# Patient Record
Sex: Female | Born: 1965
Health system: Southern US, Community
[De-identification: ages and names within clinical notes are randomized; demographics above are authoritative.]

## PROBLEM LIST (undated history)

## (undated) DIAGNOSIS — R011 Cardiac murmur, unspecified: Secondary | ICD-10-CM

## (undated) DIAGNOSIS — K859 Acute pancreatitis without necrosis or infection, unspecified: Secondary | ICD-10-CM

## (undated) DIAGNOSIS — E785 Hyperlipidemia, unspecified: Secondary | ICD-10-CM

## (undated) DIAGNOSIS — J189 Pneumonia, unspecified organism: Secondary | ICD-10-CM

## (undated) DIAGNOSIS — Z8489 Family history of other specified conditions: Secondary | ICD-10-CM

## (undated) DIAGNOSIS — D649 Anemia, unspecified: Secondary | ICD-10-CM

## (undated) DIAGNOSIS — I10 Essential (primary) hypertension: Secondary | ICD-10-CM

## (undated) DIAGNOSIS — J45909 Unspecified asthma, uncomplicated: Secondary | ICD-10-CM

## (undated) HISTORY — PX: GANGLION CYST EXCISION: SHX1691

## (undated) HISTORY — PX: ABDOMINAL HYSTERECTOMY: SHX81

## (undated) HISTORY — PX: CHOLECYSTECTOMY: SHX55

## (undated) HISTORY — PX: LUMBAR FUSION: SHX111

## (undated) HISTORY — DX: Hyperlipidemia, unspecified: E78.5

## (undated) HISTORY — PX: LAPAROSCOPY: SHX197

---

## 1997-05-26 ENCOUNTER — Other Ambulatory Visit: Admission: RE | Admit: 1997-05-26 | Discharge: 1997-05-26 | Payer: Self-pay | Admitting: Family Medicine

## 2001-09-07 ENCOUNTER — Encounter: Admission: RE | Admit: 2001-09-07 | Discharge: 2001-12-06 | Payer: Self-pay | Admitting: Family Medicine

## 2002-02-04 ENCOUNTER — Other Ambulatory Visit: Admission: RE | Admit: 2002-02-04 | Discharge: 2002-02-04 | Payer: Self-pay | Admitting: Obstetrics and Gynecology

## 2004-04-12 ENCOUNTER — Other Ambulatory Visit: Admission: RE | Admit: 2004-04-12 | Discharge: 2004-04-12 | Payer: Self-pay | Admitting: Family Medicine

## 2005-04-21 ENCOUNTER — Other Ambulatory Visit: Admission: RE | Admit: 2005-04-21 | Discharge: 2005-04-21 | Payer: Self-pay | Admitting: Obstetrics and Gynecology

## 2006-07-14 ENCOUNTER — Other Ambulatory Visit: Admission: RE | Admit: 2006-07-14 | Discharge: 2006-07-14 | Payer: Self-pay | Admitting: Obstetrics and Gynecology

## 2008-11-08 ENCOUNTER — Other Ambulatory Visit: Admission: RE | Admit: 2008-11-08 | Discharge: 2008-11-08 | Payer: Self-pay | Admitting: Obstetrics and Gynecology

## 2011-06-11 ENCOUNTER — Other Ambulatory Visit (HOSPITAL_COMMUNITY)
Admission: RE | Admit: 2011-06-11 | Discharge: 2011-06-11 | Disposition: A | Discharge status: Home / Self Care | Payer: 59 | Source: Ambulatory Visit | Attending: Obstetrics and Gynecology | Admitting: Obstetrics and Gynecology

## 2011-06-11 ENCOUNTER — Other Ambulatory Visit: Payer: Self-pay | Admitting: Obstetrics and Gynecology

## 2011-06-11 DIAGNOSIS — Z1159 Encounter for screening for other viral diseases: Secondary | ICD-10-CM | POA: Insufficient documentation

## 2011-06-11 DIAGNOSIS — Z01419 Encounter for gynecological examination (general) (routine) without abnormal findings: Secondary | ICD-10-CM | POA: Insufficient documentation

## 2011-07-04 ENCOUNTER — Other Ambulatory Visit: Payer: Self-pay | Admitting: Obstetrics and Gynecology

## 2011-07-04 DIAGNOSIS — Z1231 Encounter for screening mammogram for malignant neoplasm of breast: Secondary | ICD-10-CM

## 2011-07-14 ENCOUNTER — Ambulatory Visit: Payer: 59

## 2011-07-30 ENCOUNTER — Encounter (HOSPITAL_COMMUNITY): Payer: Self-pay | Admitting: Pharmacist

## 2011-08-06 ENCOUNTER — Encounter (HOSPITAL_COMMUNITY): Payer: Self-pay

## 2011-08-06 ENCOUNTER — Encounter (HOSPITAL_COMMUNITY)
Admission: RE | Admit: 2011-08-06 | Discharge: 2011-08-06 | Disposition: A | Discharge status: Home / Self Care | Payer: 59 | Source: Ambulatory Visit | Attending: Obstetrics and Gynecology | Admitting: Obstetrics and Gynecology

## 2011-08-06 LAB — CBC
HCT: 28.6 % — ABNORMAL LOW (ref 36.0–46.0)
Hemoglobin: 8.8 g/dL — ABNORMAL LOW (ref 12.0–15.0)
MCH: 26.4 pg (ref 26.0–34.0)
MCHC: 30.8 g/dL (ref 30.0–36.0)
MCV: 85.9 fL (ref 78.0–100.0)
RDW: 14.5 % (ref 11.5–15.5)

## 2011-08-06 LAB — BASIC METABOLIC PANEL
BUN: 9 mg/dL (ref 6–23)
Calcium: 9.5 mg/dL (ref 8.4–10.5)
Creatinine, Ser: 0.56 mg/dL (ref 0.50–1.10)
GFR calc Af Amer: >90 mL/min (ref >90)
GFR calc non Af Amer: >90 mL/min (ref >90)
Glucose, Bld: 87 mg/dL (ref 70–99)
Potassium: 3.7 mEq/L (ref 3.5–5.1)

## 2011-08-06 NOTE — Patient Instructions (Addendum)
20 Veronica Fletcher  08/06/2011   Your procedure is scheduled on:  08/13/11  Enter through the Main Entrance of [facility] at 1130 AM.  Pick up the phone at the desk and dial [phone].   Call this number if you have problems the morning of surgery: [phone]   Remember:   Do not eat food:After Midnight.  Do not drink clear liquids: After Midnight.  Take these medicines the morning of surgery with A SIP OF WATER: NA       Hold Metformin for 24hrs prior to surgery   Do not wear jewelry, make-up or nail polish.  Do not wear lotions, powders, or perfumes. You may wear deodorant.  Do not shave 48 hours prior to surgery.  Do not bring valuables to the hospital.  Contacts, dentures or bridgework may not be worn into surgery.  Leave suitcase in the car. After surgery it may be brought to your room.  For patients admitted to the hospital, checkout time is 11:00 AM the day of discharge.   Patients discharged the day of surgery will not be allowed to drive home.  Name and phone number of your driver: NA  Special Instructions: CHG Shower Use Special Wash: 1/2 bottle night before surgery and 1/2 bottle morning of surgery.   Please read over the following fact sheets that you were given: MRSA Information

## 2011-08-11 ENCOUNTER — Other Ambulatory Visit: Payer: Self-pay | Admitting: Obstetrics and Gynecology

## 2011-08-12 MED ORDER — CEFAZOLIN SODIUM-DEXTROSE 2-3 GM-% IV SOLR
2.0000 g | INTRAVENOUS | Status: AC
  Administered 2011-08-13: 2 g via INTRAVENOUS

## 2011-08-13 ENCOUNTER — Encounter (HOSPITAL_COMMUNITY): Payer: Self-pay | Admitting: Registered Nurse

## 2011-08-13 ENCOUNTER — Encounter (HOSPITAL_COMMUNITY)
Admission: RE | Disposition: A | Discharge status: Home / Self Care | Payer: Self-pay | Source: Ambulatory Visit | Attending: Obstetrics and Gynecology

## 2011-08-13 ENCOUNTER — Ambulatory Visit (HOSPITAL_COMMUNITY): Payer: 59 | Admitting: Registered Nurse

## 2011-08-13 ENCOUNTER — Ambulatory Visit (HOSPITAL_COMMUNITY)
Admission: RE | Admit: 2011-08-13 | Discharge: 2011-08-14 | Disposition: A | Discharge status: Home / Self Care | Payer: 59 | Source: Ambulatory Visit | Attending: Obstetrics and Gynecology | Admitting: Obstetrics and Gynecology

## 2011-08-13 ENCOUNTER — Encounter (HOSPITAL_COMMUNITY): Payer: Self-pay | Admitting: *Deleted

## 2011-08-13 DIAGNOSIS — N920 Excessive and frequent menstruation with regular cycle: Secondary | ICD-10-CM | POA: Insufficient documentation

## 2011-08-13 DIAGNOSIS — E119 Type 2 diabetes mellitus without complications: Secondary | ICD-10-CM | POA: Insufficient documentation

## 2011-08-13 DIAGNOSIS — D250 Submucous leiomyoma of uterus: Secondary | ICD-10-CM | POA: Diagnosis present

## 2011-08-13 DIAGNOSIS — D649 Anemia, unspecified: Secondary | ICD-10-CM | POA: Insufficient documentation

## 2011-08-13 LAB — GLUCOSE, CAPILLARY
Glucose-Capillary: 113 mg/dL — ABNORMAL HIGH (ref 70–99)
Glucose-Capillary: 122 mg/dL — ABNORMAL HIGH (ref 70–99)
Glucose-Capillary: 128 mg/dL — ABNORMAL HIGH (ref 70–99)
Glucose-Capillary: 140 mg/dL — ABNORMAL HIGH (ref 70–99)
Glucose-Capillary: 159 mg/dL — ABNORMAL HIGH (ref 70–99)

## 2011-08-13 LAB — CBC
Hemoglobin: 8.6 g/dL — ABNORMAL LOW (ref 12.0–15.0)
MCH: 25.7 pg — ABNORMAL LOW (ref 26.0–34.0)
MCHC: 30.3 g/dL (ref 30.0–36.0)
RDW: 14 % (ref 11.5–15.5)

## 2011-08-13 LAB — PREGNANCY, URINE: Preg Test, Ur: NEGATIVE

## 2011-08-13 SURGERY — ROBOTIC ASSISTED TOTAL HYSTERECTOMY
Anesthesia: General | Site: Abdomen | Wound class: Clean Contaminated

## 2011-08-13 MED ORDER — MIDAZOLAM HCL 2 MG/2ML IJ SOLN: 0.5000 mg | Freq: Once | INTRAMUSCULAR | Status: DC | PRN

## 2011-08-13 MED ORDER — HYDROMORPHONE HCL PF 1 MG/ML IJ SOLN
INTRAMUSCULAR | Status: AC
  Administered 2011-08-13: 0.5 mg via INTRAVENOUS
  Filled 2011-08-13: qty 1

## 2011-08-13 MED ORDER — LIDOCAINE HCL (CARDIAC) 20 MG/ML IV SOLN
INTRAVENOUS | Status: DC | PRN
  Administered 2011-08-13: 60 mg via INTRAVENOUS

## 2011-08-13 MED ORDER — KETOROLAC TROMETHAMINE 30 MG/ML IJ SOLN
30.0000 mg | Freq: Four times a day (QID) | INTRAMUSCULAR | Status: DC
  Administered 2011-08-13 – 2011-08-14 (×2): 30 mg via INTRAVENOUS
  Filled 2011-08-13 (×2): qty 1

## 2011-08-13 MED ORDER — ONDANSETRON HCL 4 MG/2ML IJ SOLN: 4.0000 mg | Freq: Four times a day (QID) | INTRAMUSCULAR | Status: DC | PRN

## 2011-08-13 MED ORDER — ROPIVACAINE HCL 5 MG/ML IJ SOLN
INTRAMUSCULAR | Status: DC | PRN
  Administered 2011-08-13: 60 mL

## 2011-08-13 MED ORDER — PROPOFOL 10 MG/ML IV EMUL
INTRAVENOUS | Status: DC | PRN
  Administered 2011-08-13: 200 mg via INTRAVENOUS

## 2011-08-13 MED ORDER — FENTANYL CITRATE 0.05 MG/ML IJ SOLN
INTRAMUSCULAR | Status: DC | PRN
  Administered 2011-08-13 (×3): 100 ug via INTRAVENOUS
  Administered 2011-08-13 (×2): 50 ug via INTRAVENOUS
  Administered 2011-08-13: 100 ug via INTRAVENOUS

## 2011-08-13 MED ORDER — KETOROLAC TROMETHAMINE 30 MG/ML IJ SOLN
15.0000 mg | Freq: Once | INTRAMUSCULAR | Status: AC | PRN
  Administered 2011-08-13: 30 mg via INTRAVENOUS

## 2011-08-13 MED ORDER — CEFAZOLIN SODIUM-DEXTROSE 2-3 GM-% IV SOLR
INTRAVENOUS | Status: AC
  Filled 2011-08-13: qty 50

## 2011-08-13 MED ORDER — SODIUM CHLORIDE 0.9 % IJ SOLN: 9.0000 mL | INTRAMUSCULAR | Status: DC | PRN

## 2011-08-13 MED ORDER — MIDAZOLAM HCL 5 MG/5ML IJ SOLN
INTRAMUSCULAR | Status: DC | PRN
  Administered 2011-08-13: 2 mg via INTRAVENOUS

## 2011-08-13 MED ORDER — STERILE WATER FOR IRRIGATION IR SOLN
Status: DC | PRN
  Administered 2011-08-13: 15:00:00 via INTRAVESICAL

## 2011-08-13 MED ORDER — LACTATED RINGERS IV SOLN
INTRAVENOUS | Status: DC
  Administered 2011-08-13 (×4): via INTRAVENOUS

## 2011-08-13 MED ORDER — IBUPROFEN 600 MG PO TABS
600.0000 mg | ORAL_TABLET | Freq: Four times a day (QID) | ORAL | Status: DC | PRN
  Administered 2011-08-14: 600 mg via ORAL
  Filled 2011-08-13: qty 1

## 2011-08-13 MED ORDER — ONDANSETRON HCL 4 MG/2ML IJ SOLN
INTRAMUSCULAR | Status: DC | PRN
  Administered 2011-08-13: 4 mg via INTRAVENOUS

## 2011-08-13 MED ORDER — ROPIVACAINE HCL 5 MG/ML IJ SOLN
INTRAMUSCULAR | Status: AC
  Filled 2011-08-13: qty 60

## 2011-08-13 MED ORDER — FENTANYL CITRATE 0.05 MG/ML IJ SOLN
INTRAMUSCULAR | Status: AC
  Filled 2011-08-13: qty 10

## 2011-08-13 MED ORDER — ONDANSETRON HCL 4 MG/2ML IJ SOLN
INTRAMUSCULAR | Status: AC
  Filled 2011-08-13: qty 2

## 2011-08-13 MED ORDER — HYDROMORPHONE 0.3 MG/ML IV SOLN
INTRAVENOUS | Status: DC
  Administered 2011-08-13: 19:00:00 via INTRAVENOUS
  Administered 2011-08-13: 0.6 mg via INTRAVENOUS
  Administered 2011-08-14: 1.5 mg via INTRAVENOUS
  Administered 2011-08-14: 1.8 mg via INTRAVENOUS
  Filled 2011-08-13: qty 25

## 2011-08-13 MED ORDER — HYDROMORPHONE HCL PF 1 MG/ML IJ SOLN
0.2500 mg | INTRAMUSCULAR | Status: DC | PRN
  Administered 2011-08-13 (×2): 0.5 mg via INTRAVENOUS

## 2011-08-13 MED ORDER — GLYCOPYRROLATE 0.2 MG/ML IJ SOLN
INTRAMUSCULAR | Status: AC
  Filled 2011-08-13: qty 2

## 2011-08-13 MED ORDER — METHYLENE BLUE 1 % INJ SOLN
INTRAMUSCULAR | Status: AC
  Filled 2011-08-13: qty 1

## 2011-08-13 MED ORDER — PHENYLEPHRINE 40 MCG/ML (10ML) SYRINGE FOR IV PUSH (FOR BLOOD PRESSURE SUPPORT)
PREFILLED_SYRINGE | INTRAVENOUS | Status: AC
  Filled 2011-08-13: qty 5

## 2011-08-13 MED ORDER — LIDOCAINE HCL (CARDIAC) 20 MG/ML IV SOLN
INTRAVENOUS | Status: AC
  Filled 2011-08-13: qty 5

## 2011-08-13 MED ORDER — DEXAMETHASONE SODIUM PHOSPHATE 10 MG/ML IJ SOLN
INTRAMUSCULAR | Status: AC
  Filled 2011-08-13: qty 1

## 2011-08-13 MED ORDER — ROCURONIUM BROMIDE 50 MG/5ML IV SOLN
INTRAVENOUS | Status: AC
  Filled 2011-08-13: qty 1

## 2011-08-13 MED ORDER — DIPHENHYDRAMINE HCL 12.5 MG/5ML PO ELIX: 12.5000 mg | ORAL_SOLUTION | Freq: Four times a day (QID) | ORAL | Status: DC | PRN

## 2011-08-13 MED ORDER — ROCURONIUM BROMIDE 100 MG/10ML IV SOLN
INTRAVENOUS | Status: DC | PRN
  Administered 2011-08-13: 50 mg via INTRAVENOUS
  Administered 2011-08-13 (×2): 10 mg via INTRAVENOUS
  Administered 2011-08-13: 20 mg via INTRAVENOUS
  Administered 2011-08-13: 10 mg via INTRAVENOUS

## 2011-08-13 MED ORDER — NEOSTIGMINE METHYLSULFATE 1 MG/ML IJ SOLN
INTRAMUSCULAR | Status: AC
  Filled 2011-08-13: qty 10

## 2011-08-13 MED ORDER — ARTIFICIAL TEARS OP OINT
TOPICAL_OINTMENT | OPHTHALMIC | Status: AC
  Filled 2011-08-13: qty 3.5

## 2011-08-13 MED ORDER — DIPHENHYDRAMINE HCL 50 MG/ML IJ SOLN
12.5000 mg | Freq: Four times a day (QID) | INTRAMUSCULAR | Status: DC | PRN
  Administered 2011-08-14: 12.5 mg via INTRAVENOUS
  Filled 2011-08-13: qty 1

## 2011-08-13 MED ORDER — KETOROLAC TROMETHAMINE 30 MG/ML IJ SOLN
INTRAMUSCULAR | Status: AC
  Administered 2011-08-13: 30 mg via INTRAVENOUS
  Filled 2011-08-13: qty 1

## 2011-08-13 MED ORDER — MIDAZOLAM HCL 2 MG/2ML IJ SOLN
INTRAMUSCULAR | Status: AC
  Filled 2011-08-13: qty 2

## 2011-08-13 MED ORDER — HYDROMORPHONE HCL PF 1 MG/ML IJ SOLN
INTRAMUSCULAR | Status: AC
  Filled 2011-08-13: qty 1

## 2011-08-13 MED ORDER — PROPOFOL 10 MG/ML IV EMUL
INTRAVENOUS | Status: AC
  Filled 2011-08-13: qty 20

## 2011-08-13 MED ORDER — NALOXONE HCL 0.4 MG/ML IJ SOLN: 0.4000 mg | INTRAMUSCULAR | Status: DC | PRN

## 2011-08-13 MED ORDER — HYDROMORPHONE HCL PF 1 MG/ML IJ SOLN
INTRAMUSCULAR | Status: DC | PRN
  Administered 2011-08-13: 1 mg via INTRAVENOUS

## 2011-08-13 MED ORDER — GLYCOPYRROLATE 0.2 MG/ML IJ SOLN
INTRAMUSCULAR | Status: DC | PRN
  Administered 2011-08-13: .8 mg via INTRAVENOUS

## 2011-08-13 MED ORDER — INSULIN ASPART 100 UNIT/ML ~~LOC~~ SOLN
0.0000 [IU] | SUBCUTANEOUS | Status: DC
  Administered 2011-08-13: 2 [IU] via SUBCUTANEOUS
  Administered 2011-08-13: 3 [IU] via SUBCUTANEOUS
  Administered 2011-08-14: 2 [IU] via SUBCUTANEOUS
  Administered 2011-08-14: 3 [IU] via SUBCUTANEOUS
  Administered 2011-08-14: 2 [IU] via SUBCUTANEOUS

## 2011-08-13 MED ORDER — NEOSTIGMINE METHYLSULFATE 1 MG/ML IJ SOLN
INTRAMUSCULAR | Status: DC | PRN
  Administered 2011-08-13: 4 mg via INTRAVENOUS

## 2011-08-13 MED ORDER — OXYCODONE-ACETAMINOPHEN 5-325 MG PO TABS
1.0000 | ORAL_TABLET | ORAL | Status: DC | PRN
  Administered 2011-08-14: 2 via ORAL
  Filled 2011-08-13: qty 2

## 2011-08-13 MED ORDER — LACTATED RINGERS IR SOLN
Status: DC | PRN
  Administered 2011-08-13: 3000 mL

## 2011-08-13 MED ORDER — MEPERIDINE HCL 25 MG/ML IJ SOLN: 6.2500 mg | INTRAMUSCULAR | Status: DC | PRN

## 2011-08-13 MED ORDER — STERILE WATER FOR IRRIGATION IR SOLN
Status: DC | PRN
  Administered 2011-08-13 (×2): 1000 mL via INTRAVESICAL

## 2011-08-13 MED ORDER — PROMETHAZINE HCL 25 MG/ML IJ SOLN: 6.2500 mg | INTRAMUSCULAR | Status: DC | PRN

## 2011-08-13 SURGICAL SUPPLY — 73 items
ADH SKN CLS APL DERMABOND .7 (GAUZE/BANDAGES/DRESSINGS) ×1
BAG URINE DRAINAGE (UROLOGICAL SUPPLIES) ×2 IMPLANT
BARRIER ADHS 3X4 INTERCEED (GAUZE/BANDAGES/DRESSINGS) ×2 IMPLANT
BRR ADH 4X3 ABS CNTRL BYND (GAUZE/BANDAGES/DRESSINGS) ×1
CABLE HIGH FREQUENCY MONO STRZ (ELECTRODE) ×2 IMPLANT
CATH FOLEY 3WAY  5CC 16FR (CATHETERS) ×1
CATH FOLEY 3WAY 5CC 16FR (CATHETERS) ×1 IMPLANT
CONT PATH 16OZ SNAP LID 3702 (MISCELLANEOUS) ×2 IMPLANT
COVER MAYO STAND STRL (DRAPES) ×2 IMPLANT
COVER TABLE BACK 60X90 (DRAPES) ×4 IMPLANT
COVER TIP SHEARS 8 DVNC (MISCELLANEOUS) ×1 IMPLANT
COVER TIP SHEARS 8MM DA VINCI (MISCELLANEOUS) ×1
DECANTER SPIKE VIAL GLASS SM (MISCELLANEOUS) ×2 IMPLANT
DERMABOND ADVANCED (GAUZE/BANDAGES/DRESSINGS) ×1
DERMABOND ADVANCED .7 DNX12 (GAUZE/BANDAGES/DRESSINGS) ×1 IMPLANT
DILATOR CANAL MILEX (MISCELLANEOUS) ×2 IMPLANT
DRAPE HUG U DISPOSABLE (DRAPE) ×2 IMPLANT
DRAPE LG THREE QUARTER DISP (DRAPES) ×4 IMPLANT
DRAPE MONITOR DA VINCI (DRAPE) IMPLANT
DRAPE WARM FLUID 44X44 (DRAPE) ×2 IMPLANT
DRSG TEGADERM 4X10 (GAUZE/BANDAGES/DRESSINGS) ×1 IMPLANT
ELECT REM PT RETURN 9FT ADLT (ELECTROSURGICAL) ×2
ELECTRODE REM PT RTRN 9FT ADLT (ELECTROSURGICAL) ×1 IMPLANT
EVACUATOR SMOKE 8.L (FILTER) ×2 IMPLANT
GAUZE VASELINE 3X9 (GAUZE/BANDAGES/DRESSINGS) IMPLANT
GLOVE BIO SURGEON STRL SZ 6.5 (GLOVE) ×6 IMPLANT
GLOVE BIO SURGEON STRL SZ7 (GLOVE) IMPLANT
GLOVE BIOGEL M 6.5 STRL (GLOVE) ×4 IMPLANT
GLOVE BIOGEL PI IND STRL 6.5 (GLOVE) ×2 IMPLANT
GLOVE BIOGEL PI IND STRL 7.0 (GLOVE) ×5 IMPLANT
GLOVE BIOGEL PI INDICATOR 6.5 (GLOVE) ×2
GLOVE BIOGEL PI INDICATOR 7.0 (GLOVE) ×5
GLOVE ECLIPSE 6.5 STRL STRAW (GLOVE) ×8 IMPLANT
GOWN STRL REIN XL XLG (GOWN DISPOSABLE) ×12 IMPLANT
KIT ACCESSORY DA VINCI DISP (KITS) ×1
KIT ACCESSORY DVNC DISP (KITS) ×1 IMPLANT
KIT DISP ACCESSORY 4 ARM (KITS) IMPLANT
NDL INSUFFLATION 14GA 120MM (NEEDLE) IMPLANT
NDL SAFETY ECLIPSE 18X1.5 (NEEDLE) IMPLANT
NEEDLE HYPO 18GX1.5 SHARP (NEEDLE) ×2
NEEDLE INSUFFLATION 14GA 120MM (NEEDLE) IMPLANT
OCCLUDER COLPOPNEUMO (BALLOONS) IMPLANT
PACK LAVH (CUSTOM PROCEDURE TRAY) ×2 IMPLANT
PAD PREP 24X48 CUFFED NSTRL (MISCELLANEOUS) ×4 IMPLANT
PLUG CATH AND CAP STER (CATHETERS) ×2 IMPLANT
PROTECTOR NERVE ULNAR (MISCELLANEOUS) ×4 IMPLANT
SET CYSTO W/LG BORE CLAMP LF (SET/KITS/TRAYS/PACK) ×2 IMPLANT
SET IRRIG TUBING LAPAROSCOPIC (IRRIGATION / IRRIGATOR) ×2 IMPLANT
SOLUTION ELECTROLUBE (MISCELLANEOUS) ×2 IMPLANT
SPONGE LAP 18X18 X RAY DECT (DISPOSABLE) IMPLANT
SUT VIC AB 0 CT1 27 (SUTURE) ×4
SUT VIC AB 0 CT1 27XBRD ANBCTR (SUTURE) ×2 IMPLANT
SUT VICRYL 0 27 CT2 27 ABS (SUTURE) ×11 IMPLANT
SUT VICRYL 0 UR6 27IN ABS (SUTURE) ×2 IMPLANT
SUT VICRYL 4-0 PS2 18IN ABS (SUTURE) ×1 IMPLANT
SUT VICRYL RAPIDE 4/0 PS 2 (SUTURE) ×4 IMPLANT
SYR 30ML LL (SYRINGE) ×2 IMPLANT
SYR 50ML LL SCALE MARK (SYRINGE) ×2 IMPLANT
SYR TB 1ML 25GX5/8 (SYRINGE) ×1 IMPLANT
SYSTEM CONVERTIBLE TROCAR (TROCAR) IMPLANT
TIP UTERINE 5.1X6CM LAV DISP (MISCELLANEOUS) IMPLANT
TIP UTERINE 6.7X10CM GRN DISP (MISCELLANEOUS) IMPLANT
TIP UTERINE 6.7X6CM WHT DISP (MISCELLANEOUS) ×1 IMPLANT
TIP UTERINE 6.7X8CM BLUE DISP (MISCELLANEOUS) IMPLANT
TOWEL OR 17X24 6PK STRL BLUE (TOWEL DISPOSABLE) ×6 IMPLANT
TROCAR 12M 150ML BLUNT (TROCAR) ×2 IMPLANT
TROCAR DISP BLADELESS 8 DVNC (TROCAR) ×1 IMPLANT
TROCAR DISP BLADELESS 8MM (TROCAR) ×1
TROCAR XCEL NON-BLD 11X100MML (ENDOMECHANICALS) ×2 IMPLANT
TROCAR Z-THREAD 12X150 (TROCAR) IMPLANT
TUBING FILTER THERMOFLATOR (ELECTROSURGICAL) ×2 IMPLANT
WARMER LAPAROSCOPE (MISCELLANEOUS) ×2 IMPLANT
WATER STERILE IRR 1000ML POUR (IV SOLUTION) ×6 IMPLANT

## 2011-08-13 NOTE — Anesthesia Preprocedure Evaluation (Addendum)
Anesthesia Evaluation  Patient identified by MRN, date of birth, ID band Patient awake    Reviewed: Allergy & Precautions, H&P , Patient's Chart, lab work & pertinent test results, reviewed documented beta blocker date and time   History of Anesthesia Complications Negative for: history of anesthetic complications  Airway Mallampati: II TM Distance: >3 FB Neck ROM: full    Dental No notable dental hx. (+) Poor Dentition, Loose and Dental Advisory Given,  Lower left tooth very loose, patient advised about likely tooth will be at risk for removal with intubation. Dr. Jean Rosenthal secured tooth with suture around tooth and tied to suture tied to right cheek.:   Pulmonary neg pulmonary ROS, Current Smoker,  breath sounds clear to auscultation  Pulmonary exam normal       Cardiovascular Exercise Tolerance: Good negative cardio ROS  Rhythm:regular Rate:Normal     Neuro/Psych negative neurological ROS  negative psych ROS   GI/Hepatic negative GI ROS, Neg liver ROS,   Endo/Other    Renal/GU negative Renal ROS     Musculoskeletal   Abdominal   Peds  Hematology negative hematology ROS (+)   Anesthesia Other Findings   Reproductive/Obstetrics negative OB ROS                        Anesthesia Physical Anesthesia Plan  ASA: III  Anesthesia Plan: General ETT   Post-op Pain Management:    Induction:   Airway Management Planned:   Additional Equipment:   Intra-op Plan:   Post-operative Plan:   Informed Consent: I have reviewed the patients History and Physical, chart, labs and discussed the procedure including the risks, benefits and alternatives for the proposed anesthesia with the patient or authorized representative who has indicated his/her understanding and acceptance.   Dental Advisory Given  Plan Discussed with: CRNA and Surgeon  Anesthesia Plan Comments:         Anesthesia Quick  Evaluation

## 2011-08-13 NOTE — OR Nursing (Signed)
Pt noted that she had a "loose tooth". The Anesthesiologist, Dr. Jean Rosenthal, had tied a black surgical tie to the front tooth to prevent aspiration.  Pt is acknowledges that the tooth may fall out during surgery.

## 2011-08-13 NOTE — OR Nursing (Signed)
During induction the pt's identified loose tooth became dislodged.  It was saved by anesthesia to be returned to the Pt's family.  Dr. Richardson Dopp was up dated to the pt's status when she entered the room.

## 2011-08-13 NOTE — Preoperative (Signed)
Beta Blockers   Reason not to administer Beta Blockers:Not Applicable 

## 2011-08-13 NOTE — H&P (Signed)
Date of Initial H&P: 07/15/2011  History reviewed, patient examined, pt has a loose tooth that has been taped by the anesthesiologist. Haroldine Laws.  She had a menses that began 08/11/2011. hgb is 8.6 R/b/a/ of surgery were reviewed with the patient including but not limited to infection / bleeding damage to bowel bladder/ureters and surrounding organs with the need for further surgery. R/o laparotomy discussed.r/o transfusion HIV/ hep B&C discussed. Pt voiced understanding and desires to proceed.

## 2011-08-13 NOTE — Anesthesia Postprocedure Evaluation (Signed)
Anesthesia Post Note  Patient: Veronica Fletcher  Procedure(s) Performed: Procedure(s) (LRB): ROBOTIC ASSISTED TOTAL HYSTERECTOMY (N/A) LYSIS OF ADHESION (N/A)  Anesthesia type: General  Patient location: PACU  Post pain: Pain level controlled  Post assessment: Post-op Vital signs reviewed  Last Vitals:  Filed Vitals:   08/13/11 1800  BP: 161/88  Pulse: 98  Temp: 37.2 C  Resp: 16    Post vital signs: Reviewed  Level of consciousness: sedated  Complications: No apparent anesthesia complications

## 2011-08-13 NOTE — Op Note (Signed)
08/13/2011  5:04 PM  PATIENT:  Veronica Fletcher  46 y.o. female  PRE-OPERATIVE DIAGNOSIS: 1 Menorrhagia 2 uterine fibroids 3 anemia 4 diabetes   POST-OPERATIVE DIAGNOSIS:  Same PROCEDURE:  Procedure(s) (LRB): ROBOTIC ASSISTED TOTAL HYSTERECTOMY (N/A) LYSIS OF ADHESION (N/A)  SURGEON:  Surgeon(s) and Role:    * Yigit Norkus J. Richardson Dopp, MD - Primary    * Geryl Rankins, MD - Assisting  PHYSICIAN ASSISTANT: None  ASSISTANTS: Dr. Geryl Rankins    ANESTHESIA:   general  EBL: 100 ml   Total I/O In: 1600 [I.V.:1600] Out: 300 [Urine:200; Blood:100]  BLOOD ADMINISTERED:none  DRAINS: Urinary Catheter (Foley)   LOCAL MEDICATIONS USED:  OTHER Ropivicaine   SPECIMEN:  Source of Specimen:  Uterus and Cervix   DISPOSITION OF SPECIMEN:  PATHOLOGY  COUNTS:  YES  TOURNIQUET:  * No tourniquets in log *  DICTATION: .Dragon Dictation  PLAN OF CARE: Admit for overnight observation  PATIENT DISPOSITION:  PACU - hemodynamically stable.   Delay start of Pharmacological VTE agent (>24hrs) due to surgical blood loss or risk of bleeding: not applicable  IIndication: This is a 46 y/o with menorrhagia / fibroids and anemia that desires definitive therapy via hysteretomy.    Findings. Fibroid uterus. Normal fallopian tubes an ovaries. Adhesion of ometum to hte right ovary... Adhesions of omentum to the anterior abdominal wall.   Procedure: The patient was taken to the operating room where she was placed under general anesthesia. She was placed in dorsal lithotomy position and prepped and draped in the usual sterile fashion. A weighted speculum was placed into the vagina. A Deaver was placed anteriorly for retraction. The Mirena IUD was removed without difficulty. The anterior lip of the cervix was grasped with a single-tooth tenaculum. The vaginal mucosa was injected with 2.5 cc of ropivacaine at the 2/4/ 8 and 10 oclock positions. The uterus was sounded to 6.5  Cm.  the cervix was dilated to 6 mm . 0  vicryl sutrure placed at the 12 and 6:00 positions Of the cervix to facilitate placement of a Rumi uterine manipulator. The manipulator was placed without difficulty. Weighted speculum and Deaver were removed .   Attention was turned to the patient's abdomen where a 12 mm skin incision was made 2 cm above the umbilicus.. A 12 mm trocar was placed under direct visualization . The pneumoperitoneum was achieved with CO2 gas. The laparoscope was removed. 60 cc of ropivacaine was injected into the abdominal cavity. The laparoscope was reinserted. An 8mm incision was made in the right upper quadrant and an 8 mm trocar was placed 12 centimeters from the umbilicus.later connected to robotic arm #1). An incision was made in there left upper quadrant TROCAR WAS PLACED 16 cm from the umbilicus. Later connected to robotic arm #2. Attention was turned to the right upper quadrant where a 11 mm midclavicular assistant trocar was placed. ( All incision sites were injected with 10cc of ropivicaine prior to port placement. )  Once all ports had been placed under direct visualization.The laparoscope was removed and the da Vinci robotic system was thin right-sided docked. The robotic arms were connected to the corresponding trocars as listed above. The laparoscope was then reinserted. The PK bipolar cautery was placed into port #1. The monopolar scissor placed in the port #2. All instruments were directed into the pelvis under direct visualization.  Attention was turned to the surgeons console.. The left utero-ovarian ligament was cauterized with PK and excised with scissors. The broad ligament  was cauterized with PK incised with scissors. The round ligament was cauterized with the PK incised with scissors. The anterior leaf of broad ligament was incised along the bladder reflection to the midline.   The adhesion of omentum to the right ovary was excised with laparoscopic scissors. The right utero-ovarian ligament was  cauterized with PK and excised with scissors. The right broad ligament was cauterized with PK excised scissors. The right round ligament was cauterized with PK and excised with scissors. The broad ligament was incised to the midline. The bladder was dissected off the lower uterine segments of the cervix via sharp and blunt dissection.  The uterine arteries were skeletonized bilaterally. They were cauterized with PK and transected. The KOH ring was identified. The anterior colpotomy was performed followed by the posterior colpotomy. Once the uterus and cervix were completely excised They were removed through the vagina.   The pk and scissors were removed and log tip forceps were placed in the port #1 and the cutting needle driver was placed in to port #2. The vaginal cuff angles were closed with figure-of-eight stitches of 0 Vicryl. The remainder of the vaginal cuff was closed with interrupted 0 Vicryl Vicryl figure-of-eight sutures. The pelvis was irrigated. All pelvic pedicles were examined and hemostasis was noted. Both ureters were identified and found to peristals. Interseed was placed along the vaginal cuff. All instuments removed from the ports. All ports were removed under direct Visualization. The pneumoperitoneum was released. The fascia of the 12 mm umbilical port was closed with 0 Vicryl and the fascia the 11 mm port was closed with 0 Vicryl. The skin incisions were closed with 4-0 Vicryl and then covered with Dermabond.  Sponge lap and needle counts were correct x2. The patient was awakened from anesthesia and taken to the recovery room in stable condition.

## 2011-08-13 NOTE — Transfer of Care (Signed)
Immediate Anesthesia Transfer of Care Note  Patient: Veronica Fletcher  Procedure(s) Performed: Procedure(s) (LRB): ROBOTIC ASSISTED TOTAL HYSTERECTOMY (N/A) LYSIS OF ADHESION (N/A)  Patient Location: PACU  Anesthesia Type: General  Level of Consciousness: awake, alert  and oriented  Airway & Oxygen Therapy: Patient Spontanous Breathing and Patient connected to nasal cannula oxygen  Post-op Assessment: Report given to PACU RN and Post -op Vital signs reviewed and stable  Post vital signs: Reviewed and stable  Complications: No apparent anesthesia complications

## 2011-08-14 LAB — CBC
HCT: 22.5 % — ABNORMAL LOW (ref 36.0–46.0)
Hemoglobin: 6.7 g/dL — CL (ref 12.0–15.0)
WBC: 12.2 10*3/uL — ABNORMAL HIGH (ref 4.0–10.5)

## 2011-08-14 LAB — HEMOGLOBIN AND HEMATOCRIT, BLOOD
HCT: 25 % — ABNORMAL LOW (ref 36.0–46.0)
Hemoglobin: 7.4 g/dL — ABNORMAL LOW (ref 12.0–15.0)

## 2011-08-14 MED ORDER — METFORMIN HCL 500 MG PO TABS
1000.0000 mg | ORAL_TABLET | Freq: Every day | ORAL | Status: DC
  Administered 2011-08-14: 1000 mg via ORAL
  Filled 2011-08-14 (×2): qty 2

## 2011-08-14 MED ORDER — OXYCODONE-ACETAMINOPHEN 5-325 MG PO TABS: 1.0000 | ORAL_TABLET | ORAL | Status: AC | PRN

## 2011-08-14 MED ORDER — IBUPROFEN 600 MG PO TABS: 600.0000 mg | ORAL_TABLET | Freq: Four times a day (QID) | ORAL | Status: AC | PRN

## 2011-08-14 NOTE — Anesthesia Postprocedure Evaluation (Signed)
  Anesthesia Post-op Note  Patient: Veronica Fletcher  Procedure(s) Performed: Procedure(s) (LRB): ROBOTIC ASSISTED TOTAL HYSTERECTOMY (N/A) LYSIS OF ADHESION (N/A)  Patient Location: PACU and Women's Unit  Anesthesia Type: General  Level of Consciousness: awake, alert  and oriented  Airway and Oxygen Therapy: Patient Spontanous Breathing  Post-op Pain: mild  Post-op Assessment: Patient's Cardiovascular Status Stable, Respiratory Function Stable, No signs of Nausea or vomiting and Pain level controlled  Post-op Vital Signs: stable  Complications: No apparent anesthesia complications

## 2011-08-14 NOTE — Progress Notes (Signed)
Pt teaching complete   Ambulated out  No questions

## 2011-08-14 NOTE — Addendum Note (Signed)
Addendum  created 08/14/11 0756 by Galia Rahm S Kristoph Sattler, CRNA   Modules edited:Notes Section    

## 2011-08-14 NOTE — Progress Notes (Signed)
Subjective: Patient reports tolerating PO and + flatus.  Foley catheter is still in place. Patients hgb is 6.7 from 8.6... She denies headache / dizziness/sob or chest pain. She is ambulating without difficulty.   Objective: I have reviewed patient's vital signs, intake and output and labs.  General: alert and cooperative GI: soft nontender nondistended hypoactive bowel sounds  Extremities: extremities normal, atraumatic, no cyanosis or edema   Assessment/Plan: POD #1 s/p robotic assisted laparoscopic hysterectomy Anemia. Pt is asymptomatic repeat h/h at noon if stable d/c home on iron supplement.  Diabetes will restart oral medication if pt tolerates breakfast.  Probably d/c home today   LOS: 1 day    Ammanda Dobbins J. 08/14/2011, 7:45 AM

## 2011-08-14 NOTE — Progress Notes (Signed)
I was given a referral by nurse to see this pt.  She was grateful for the visit and reported that she was feeling better.  She was drowsy from the medication and so the visit was brief.  I offered compassionate listening and presence.  [address] [place] Pager, [phone] 10:31 AM   08/14/11 1000  Clinical Encounter Type  Visited With Patient  Visit Type Initial;Spiritual support  Referral From Nurse

## 2011-08-14 NOTE — Progress Notes (Signed)
CRITICAL VALUE ALERT  Critical value received:  Hgb: 6.7  Date of notification: 08/14/2011  Time of notification:  0620  Critical value read back:yes  Nurse who received alert:  E. Toms, RN  MD notified (1st page): Dr. Christell Constant  Time of first page:  0623  MD notified (2nd page):  Time of second page:  Responding MD:  Dr. Christell Constant  Time MD responded:  [phone]  No new orders given at this time.

## 2011-08-14 NOTE — Discharge Summary (Signed)
Physician Discharge Summary  Patient ID: Veronica Fletcher MRN: [id number] DOB/AGE: 18-Jun-1965 46 y.o.  Admit date: 08/13/2011 Discharge date: 08/14/2011  Admission Diagnoses: 1. Menorrhagia 2 fibroids 3 anemia   Discharge Diagnoses: s/p robotic assisted laparoscopic hysterectomy Active Problems:  Diabetes mellitus  Menorrhagia  Fibroids, submucosal  Anemia   Discharged Condition: stable  Hospital Course: pt was admitted for observation on 08/13/2011 after undergoing a robotic assisted laparoscopic hysterectomy for menorrhagia fibroids and anemia. She did well postoperatively with return of bowel and bladder function. Hgb stable upon discharge at 7.4.   Consults: None  Significant Diagnostic Studies: labs: HGB 7.4  Treatments: insulin: regular and surgery: robotic assisted laparoscopic hysterectomy   Discharge Exam: Blood pressure 125/70, pulse 97, temperature 98.5 F (36.9 C), temperature source Oral, resp. rate 18, height 5\' 2"  (1.575 m), weight 79.833 kg (176 lb), SpO2 95.00%. General appearance: alert and cooperative GI: soft appropriately tender nondistended +bs Extremities: extremities normal, atraumatic, no cyanosis or edema  Disposition: Final discharge disposition not confirmed   Medication List  As of 08/14/2011  1:14 PM   ASK your doctor about these medications         aspirin 325 MG tablet   Take 650 mg by mouth daily.      glimepiride 2 MG tablet   Commonly known as: AMARYL   Take 2 mg by mouth every evening.      GOODY PM PO   Take 1 packet by mouth at bedtime as needed. For pain and sleep      JOLIVETTE 0.35 MG tablet   Generic drug: norethindrone   Take 1 tablet by mouth daily.      metFORMIN 500 MG tablet   Commonly known as: GLUCOPHAGE   Take 1,000-1,500 mg by mouth 2 (two) times daily with a meal. 2 tablets in morning; 3 tablets in evening      multivitamin with minerals tablet   Take 1 tablet by mouth daily. Patient takes Flinstones complete with  Vitamin D      VICTOZA 18 MG/3ML Soln   Generic drug: Liraglutide   Inject 1.4 mg into the skin every morning.           Follow-up Information    Follow up with Jessee Avers., MD. Schedule an appointment as soon as possible for a visit in 2 weeks. (postoperative visit)    Contact information:   [address] [place] [address] [phone]          Signed: Jessee Avers. 08/14/2011, 1:14 PM

## 2012-12-06 ENCOUNTER — Other Ambulatory Visit: Payer: Self-pay | Admitting: *Deleted

## 2012-12-06 MED ORDER — GLIMEPIRIDE 2 MG PO TABS: 2.0000 mg | ORAL_TABLET | Freq: Every day | ORAL | Status: DC

## 2013-02-06 ENCOUNTER — Emergency Department (HOSPITAL_COMMUNITY): Payer: 59

## 2013-02-06 ENCOUNTER — Encounter (HOSPITAL_COMMUNITY): Payer: Self-pay | Admitting: Emergency Medicine

## 2013-02-06 ENCOUNTER — Inpatient Hospital Stay (HOSPITAL_COMMUNITY)
Admission: EM | Admit: 2013-02-06 | Discharge: 2013-02-09 | DRG: 438 | Disposition: A | Discharge status: Home / Self Care | Payer: 59 | Attending: Internal Medicine | Admitting: Internal Medicine

## 2013-02-06 DIAGNOSIS — Z9089 Acquired absence of other organs: Secondary | ICD-10-CM

## 2013-02-06 DIAGNOSIS — I10 Essential (primary) hypertension: Secondary | ICD-10-CM | POA: Diagnosis present

## 2013-02-06 DIAGNOSIS — Z79899 Other long term (current) drug therapy: Secondary | ICD-10-CM

## 2013-02-06 DIAGNOSIS — D250 Submucous leiomyoma of uterus: Secondary | ICD-10-CM | POA: Diagnosis present

## 2013-02-06 DIAGNOSIS — E1110 Type 2 diabetes mellitus with ketoacidosis without coma: Secondary | ICD-10-CM | POA: Diagnosis present

## 2013-02-06 DIAGNOSIS — E1010 Type 1 diabetes mellitus with ketoacidosis without coma: Secondary | ICD-10-CM | POA: Diagnosis present

## 2013-02-06 DIAGNOSIS — E785 Hyperlipidemia, unspecified: Secondary | ICD-10-CM | POA: Diagnosis present

## 2013-02-06 DIAGNOSIS — E118 Type 2 diabetes mellitus with unspecified complications: Secondary | ICD-10-CM

## 2013-02-06 DIAGNOSIS — E1165 Type 2 diabetes mellitus with hyperglycemia: Secondary | ICD-10-CM

## 2013-02-06 DIAGNOSIS — Z87891 Personal history of nicotine dependence: Secondary | ICD-10-CM

## 2013-02-06 DIAGNOSIS — K5900 Constipation, unspecified: Secondary | ICD-10-CM | POA: Diagnosis present

## 2013-02-06 DIAGNOSIS — K859 Acute pancreatitis without necrosis or infection, unspecified: Principal | ICD-10-CM | POA: Diagnosis present

## 2013-02-06 DIAGNOSIS — Z7982 Long term (current) use of aspirin: Secondary | ICD-10-CM

## 2013-02-06 DIAGNOSIS — IMO0002 Reserved for concepts with insufficient information to code with codable children: Secondary | ICD-10-CM | POA: Diagnosis present

## 2013-02-06 DIAGNOSIS — N920 Excessive and frequent menstruation with regular cycle: Secondary | ICD-10-CM | POA: Diagnosis present

## 2013-02-06 DIAGNOSIS — D649 Anemia, unspecified: Secondary | ICD-10-CM

## 2013-02-06 DIAGNOSIS — E119 Type 2 diabetes mellitus without complications: Secondary | ICD-10-CM

## 2013-02-06 HISTORY — DX: Essential (primary) hypertension: I10

## 2013-02-06 LAB — CBC WITH DIFFERENTIAL/PLATELET
BASOS PCT: 0 % (ref 0–1)
Basophils Absolute: 0 10*3/uL (ref 0.0–0.1)
EOS PCT: 0 % (ref 0–5)
Eosinophils Absolute: 0 10*3/uL (ref 0.0–0.7)
HCT: 44.5 % (ref 36.0–46.0)
Hemoglobin: 15 g/dL (ref 12.0–15.0)
LYMPHS ABS: 2.9 10*3/uL (ref 0.7–4.0)
Lymphocytes Relative: 28 % (ref 12–46)
MCH: 29.8 pg (ref 26.0–34.0)
MCHC: 33.7 g/dL (ref 30.0–36.0)
MCV: 88.5 fL (ref 78.0–100.0)
MONO ABS: 0.8 10*3/uL (ref 0.1–1.0)
Monocytes Relative: 8 % (ref 3–12)
Neutro Abs: 6.5 10*3/uL (ref 1.7–7.7)
Neutrophils Relative %: 64 % (ref 43–77)
PLATELETS: ADEQUATE 10*3/uL (ref 150–400)
RBC: 5.03 MIL/uL (ref 3.87–5.11)
RDW: 12.5 % (ref 11.5–15.5)
WBC: 10.2 10*3/uL (ref 4.0–10.5)

## 2013-02-06 LAB — COMPREHENSIVE METABOLIC PANEL
ALBUMIN: 4.1 g/dL (ref 3.5–5.2)
ALK PHOS: 98 U/L (ref 39–117)
ALT: 25 U/L (ref 0–35)
AST: 18 U/L (ref 0–37)
BILIRUBIN TOTAL: 0.5 mg/dL (ref 0.3–1.2)
BUN: 11 mg/dL (ref 6–23)
CHLORIDE: 89 meq/L — AB (ref 96–112)
CO2: 24 mEq/L (ref 19–32)
CREATININE: 0.71 mg/dL (ref 0.50–1.10)
Calcium: 9.5 mg/dL (ref 8.4–10.5)
GFR calc Af Amer: >90 mL/min (ref >90)
GFR calc non Af Amer: >90 mL/min (ref >90)
Glucose, Bld: 350 mg/dL — ABNORMAL HIGH (ref 70–99)
POTASSIUM: 4 meq/L (ref 3.7–5.3)
SODIUM: 134 meq/L — AB (ref 137–147)
Total Protein: 8.7 g/dL — ABNORMAL HIGH (ref 6.0–8.3)

## 2013-02-06 LAB — GLUCOSE, CAPILLARY
GLUCOSE-CAPILLARY: 368 mg/dL — AB (ref 70–99)
Glucose-Capillary: 205 mg/dL — ABNORMAL HIGH (ref 70–99)

## 2013-02-06 LAB — LIPASE, BLOOD: Lipase: 138 U/L — ABNORMAL HIGH (ref 11–59)

## 2013-02-06 MED ORDER — ENOXAPARIN SODIUM 40 MG/0.4ML ~~LOC~~ SOLN
40.0000 mg | Freq: Every day | SUBCUTANEOUS | Status: DC
  Administered 2013-02-07 – 2013-02-08 (×3): 40 mg via SUBCUTANEOUS
  Filled 2013-02-06 (×5): qty 0.4

## 2013-02-06 MED ORDER — IOHEXOL 300 MG/ML  SOLN
50.0000 mL | Freq: Once | INTRAMUSCULAR | Status: AC | PRN
  Administered 2013-02-06: 50 mL via ORAL

## 2013-02-06 MED ORDER — MORPHINE SULFATE 4 MG/ML IJ SOLN
4.0000 mg | Freq: Once | INTRAMUSCULAR | Status: AC
  Administered 2013-02-06: 4 mg via INTRAVENOUS
  Filled 2013-02-06: qty 1

## 2013-02-06 MED ORDER — SODIUM CHLORIDE 0.9 % IV SOLN
1000.0000 mL | INTRAVENOUS | Status: DC
Start: 2013-02-06 — End: 2013-02-09
  Administered 2013-02-09 (×2): 1000 mL via INTRAVENOUS

## 2013-02-06 MED ORDER — DEXTROSE-NACL 5-0.45 % IV SOLN
INTRAVENOUS | Status: DC
  Administered 2013-02-06: 23:00:00 via INTRAVENOUS

## 2013-02-06 MED ORDER — SODIUM CHLORIDE 0.9 % IV BOLUS (SEPSIS)
1000.0000 mL | Freq: Once | INTRAVENOUS | Status: AC
  Administered 2013-02-06: 1000 mL via INTRAVENOUS

## 2013-02-06 MED ORDER — SODIUM CHLORIDE 0.9 % IV SOLN
INTRAVENOUS | Status: DC
  Administered 2013-02-06: 1.5 [IU]/h via INTRAVENOUS
  Filled 2013-02-06: qty 1

## 2013-02-06 MED ORDER — INSULIN REGULAR BOLUS VIA INFUSION
0.0000 [IU] | Freq: Three times a day (TID) | INTRAVENOUS | Status: DC
  Filled 2013-02-06: qty 10

## 2013-02-06 MED ORDER — ONDANSETRON HCL 4 MG/2ML IJ SOLN
4.0000 mg | Freq: Once | INTRAMUSCULAR | Status: AC
  Administered 2013-02-06: 4 mg via INTRAVENOUS
  Filled 2013-02-06: qty 2

## 2013-02-06 MED ORDER — SODIUM CHLORIDE 0.9 % IV SOLN
INTRAVENOUS | Status: DC
  Administered 2013-02-07 – 2013-02-08 (×3): via INTRAVENOUS

## 2013-02-06 MED ORDER — DEXTROSE 50 % IV SOLN
25.0000 mL | INTRAVENOUS | Status: DC | PRN
Start: 2013-02-06 — End: 2013-02-09

## 2013-02-06 MED ORDER — MORPHINE SULFATE 2 MG/ML IJ SOLN
2.0000 mg | INTRAMUSCULAR | Status: DC | PRN
  Administered 2013-02-07 – 2013-02-09 (×5): 2 mg via INTRAVENOUS
  Filled 2013-02-06 (×5): qty 1

## 2013-02-06 MED ORDER — DEXTROSE-NACL 5-0.45 % IV SOLN: INTRAVENOUS | Status: DC

## 2013-02-06 MED ORDER — IOHEXOL 300 MG/ML  SOLN
100.0000 mL | Freq: Once | INTRAMUSCULAR | Status: AC | PRN
  Administered 2013-02-06: 100 mL via INTRAVENOUS

## 2013-02-06 NOTE — ED Notes (Signed)
Pt states that she had flulike symptoms last week. She continues to have abdominal pain with n/v and constipation. Pt is diabetic and presents with hyperglycemia. Pt also tachycardic and hypertensive.

## 2013-02-06 NOTE — ED Provider Notes (Signed)
CSN: [id number]     Arrival date & time 02/06/13  1940 History   First MD Initiated Contact with Patient 02/06/13 1956     Chief Complaint  Patient presents with  . Abdominal Pain  . Emesis  . Tachycardia   (Consider location/radiation/quality/duration/timing/severity/associated sxs/prior Treatment) HPI Comments: Patient is a 48 year old female past medical history significant for DM, HTN presented to the emergency department for one week of flulike symptoms with associated nausea and vomiting and constipation. Patient states last week she had subjective fever, myalgias, generalized abdominal pain that has resolved. She states she's had 2-3 days of increased abdominal pain with associated nausea and nonbloody nonbilious vomiting. Patient states she's also not had a bowel movement in several days despite trying stool softeners and an enema. She denies any alleviating or aggravating factors. Her abdominal surgical history includes cholecystectomy and cesarean section. Patient states her glucose typically runs in the 100s to 200s with the highest being 240. She states she takes metformin and Glimepiride for her DM.   Patient is a 48 y.o. female presenting with abdominal pain and vomiting.  Abdominal Pain Associated symptoms: vomiting   Emesis Associated symptoms: abdominal pain     Past Medical History  Diagnosis Date  . Diabetes mellitus   . Hypertension    Past Surgical History  Procedure Laterality Date  . Cholecystectomy    . Laparoscopy    . Cesarean section    . Ganglion cyst excision     No family history on file. History  Substance Use Topics  . Smoking status: Former Smoker -- 0.50 packs/day    Types: Cigarettes    Quit date: 01/23/2013  . Smokeless tobacco: Never Used  . Alcohol Use: Yes     Comment: occasionally   OB History   Grav Para Term Preterm Abortions TAB SAB Ect Mult Living                 Review of Systems  Gastrointestinal: Positive for vomiting and  abdominal pain.    Allergies  Review of patient's allergies indicates no known allergies.  Home Medications   Current Outpatient Rx  Name  Route  Sig  Dispense  Refill  . aspirin EC 81 MG tablet   Oral   Take 81 mg by mouth every morning.         . bisacodyl (EX-LAX ULTRA) 5 MG EC tablet   Oral   Take 5 mg by mouth daily as needed for mild constipation or moderate constipation.         . Diphenhydramine-APAP, sleep, (GOODY PM PO)   Oral   Take 1 packet by mouth at bedtime as needed. For pain and sleep         . glimepiride (AMARYL) 2 MG tablet   Oral   Take 1 tablet (2 mg total) by mouth daily with breakfast.   90 tablet   1   . guaiFENesin (MUCINEX) 600 MG 12 hr tablet   Oral   Take 600 mg by mouth 2 (two) times daily as needed for cough or to loosen phlegm.         Marland Kitchen JARDIANCE 25 MG TABS   Oral   Take 1 tablet by mouth every morning.         . Liraglutide (VICTOZA) 18 MG/3ML SOLN   Subcutaneous   Inject 1.2 mg into the skin every morning.          . metFORMIN (GLUCOPHAGE-XR) 500 MG 24 hr  tablet   Oral   Take 1,000 mg by mouth 2 (two) times daily.         . Multiple Vitamins-Minerals (MULTIVITAMIN WITH MINERALS) tablet   Oral   Take 1 tablet by mouth every morning. Patient takes Flinstones complete with Vitamin D          BP 176/99  Pulse 102  Temp(Src) 97.9 F (36.6 C) (Oral)  Resp 19  Ht 5\' 2"  (1.575 m)  Wt 180 lb (81.647 kg)  BMI 32.91 kg/m2  SpO2 100%  LMP 07/09/2011 Physical Exam  Constitutional: She is oriented to person, place, and time. She appears well-developed and well-nourished. No distress.  HENT:  Head: Normocephalic and atraumatic.  Right Ear: External ear normal.  Left Ear: External ear normal.  Nose: Nose normal.  Mouth/Throat: Uvula is midline and oropharynx is clear and moist. Mucous membranes are dry.  Eyes: Conjunctivae are normal.  Neck: Neck supple.  Cardiovascular: Regular rhythm, normal heart sounds and  intact distal pulses.  Tachycardia present.   Pulmonary/Chest: Effort normal and breath sounds normal.  Abdominal: Soft. Bowel sounds are normal. She exhibits no distension. There is generalized tenderness. There is no rebound and no guarding.  Musculoskeletal: Normal range of motion. She exhibits no edema.  Neurological: She is alert and oriented to person, place, and time.  Skin: Skin is warm. She is diaphoretic.    ED Course  Procedures (including critical care time) Medications  dextrose 5 %-0.45 % sodium chloride infusion ( Intravenous New Bag/Given 02/06/13 2325)  insulin regular (NOVOLIN R,HUMULIN R) 1 Units/mL in sodium chloride 0.9 % 100 mL infusion ( Intravenous Stopped 02/06/13 2329)  0.9 %  sodium chloride infusion (0 mLs Intravenous Paused 02/06/13 2328)  enoxaparin (LOVENOX) injection 40 mg (not administered)  dextrose 5 %-0.45 % sodium chloride infusion ( Intravenous Rate/Dose Change 02/06/13 2331)  insulin regular bolus via infusion 0-10 Units (not administered)  dextrose 50 % solution 25 mL (not administered)  0.9 %  sodium chloride infusion (not administered)  morphine 2 MG/ML injection 2 mg (not administered)  sodium chloride 0.9 % bolus 1,000 mL (0 mLs Intravenous Stopped 02/06/13 2225)  ondansetron (ZOFRAN) injection 4 mg (4 mg Intravenous Given 02/06/13 2025)  iohexol (OMNIPAQUE) 300 MG/ML solution 50 mL (50 mLs Oral Contrast Given 02/06/13 2123)  iohexol (OMNIPAQUE) 300 MG/ML solution 100 mL (100 mLs Intravenous Contrast Given 02/06/13 2156)  sodium chloride 0.9 % bolus 1,000 mL (0 mLs Intravenous Stopped 02/06/13 2328)  morphine 4 MG/ML injection 4 mg (4 mg Intravenous Given 02/06/13 2223)  sodium chloride 0.9 % bolus 1,000 mL (1,000 mLs Intravenous New Bag/Given 02/06/13 2328)    Labs Review Labs Reviewed  GLUCOSE, CAPILLARY - Abnormal; Notable for the following:    Glucose-Capillary 368 (*)    All other components within normal limits  COMPREHENSIVE METABOLIC PANEL -  Abnormal; Notable for the following:    Sodium 134 (*)    Chloride 89 (*)    Glucose, Bld 350 (*)    Total Protein 8.7 (*)    All other components within normal limits  LIPASE, BLOOD - Abnormal; Notable for the following:    Lipase 138 (*)    All other components within normal limits  GLUCOSE, CAPILLARY - Abnormal; Notable for the following:    Glucose-Capillary 205 (*)    All other components within normal limits  GLUCOSE, CAPILLARY - Abnormal; Notable for the following:    Glucose-Capillary 216 (*)    All other components  within normal limits  CBC WITH DIFFERENTIAL  URINALYSIS, ROUTINE W REFLEX MICROSCOPIC  HEMOGLOBIN A1C  LIPID PANEL  BASIC METABOLIC PANEL  BASIC METABOLIC PANEL  BASIC METABOLIC PANEL   Imaging Review Ct Abdomen Pelvis W Contrast  02/06/2013   CLINICAL DATA:  Abdominal pain, history of cholecystectomy.  EXAM: CT ABDOMEN AND PELVIS WITH CONTRAST  TECHNIQUE: Multidetector CT imaging of the abdomen and pelvis was performed using the standard protocol following bolus administration of intravenous contrast.  CONTRAST:  56mL OMNIPAQUE IOHEXOL 300 MG/ML SOLN, 182mL OMNIPAQUE IOHEXOL 300 MG/ML SOLN  COMPARISON:  02/06/2013 radiograph  FINDINGS: Mild linear opacity within the left lung base, favored to reflect atelectasis. Heart size within normal limits.  No appreciable abnormality of the liver, spleen, adrenal glands. There is mild stranding of the fat along the pancreaticoduodenal groove. Cholecystectomy. No biliary ductal dilatation. Symmetric renal enhancement. No hydroureteronephrosis.  No CT evidence for colitis. Normal appendix. Small bowel loops are of normal course and caliber. No free intraperitoneal air. No free fluid. Absent uterus. Thin walled bladder. No adnexal mass.  Normal caliber aorta and branch vessels.  Hypertrophic changes along the right supra-acetabular region is favored to reflect sequelae of prior avulsion injury. No acute osseous finding. Degenerative  changes of the lower thoracic spine.  IMPRESSION: Mild stranding along the pancreaticoduodenal groove may reflect a mild groove pancreatitis. Correlate with lipase and symptoms.   Electronically Signed   By: Carlos Levering M.D.   On: 02/06/2013 22:26   Dg Abd Acute W/chest  02/06/2013   CLINICAL DATA:  Abdominal pain  EXAM: ACUTE ABDOMEN SERIES (ABDOMEN 2 VIEW & CHEST 1 VIEW)  COMPARISON:  None.  FINDINGS: Mild linear opacity within the right greater than left lower lobes, favored to reflect atelectasis or scarring. Surgical clips right upper quadrant. Bowel gas pattern nonobstructive. Moderate stool burden. No free intraperitoneal air identified. Hypertrophic osseous changes arising from the region of the right anterior inferior iliac spine and extending inferiorly along the lateral margin of the right femoroacetabular joint and right femoral head.  IMPRESSION: Moderate stool burden.  Bowel gas pattern nonobstructive.  Hypertrophic right supra-acetabular osseous changes may reflect sequelae of remote trauma/ avulsion injury. Recommend clinical correlation and nonemergent dedicated right hip radiographs.   Electronically Signed   By: Carlos Levering M.D.   On: 02/06/2013 21:01    EKG Interpretation    Date/Time:  Sunday February 06 2013 19:57:15 EST Ventricular Rate:  135 PR Interval:  134 QRS Duration: 63 QT Interval:  306 QTC Calculation: 459 R Axis:   54 Text Interpretation:  Sinus tachycardia Consider right atrial enlargement No previous ECGs available Confirmed by YAO  MD, DAVID [phone]) on 02/06/2013 8:06:38 PM           CRITICAL CARE Performed by: Baron Sane L   Total critical care time: 30 minutes  Critical care time was exclusive of separately billable procedures and treating other patients.  Critical care was necessary to treat or prevent imminent or life-threatening deterioration.  Critical care was time spent personally by me on the following activities:  development of treatment plan with patient and/or surrogate as well as nursing, discussions with consultants, evaluation of patient's response to treatment, examination of patient, obtaining history from patient or surrogate, ordering and performing treatments and interventions, ordering and review of laboratory studies, ordering and review of radiographic studies, pulse oximetry and re-evaluation of patient's condition.  MDM   1. DKA (diabetic ketoacidoses)    Filed Vitals:  02/06/13 2222  BP:   Pulse:   Temp: 97.9 F (36.6 C)  Resp:     Patient afebrile, tachycardic and hypertensive upon arrival. Abdomen soft, diffusely tender, no guarding, rigidity or rebound. Mucous membranes dry. Concern for DKA and/or bowel obstruction initial evaluation. IV fluids, nausea meds, pain medications given. Improvement of tachycardia and hypertension noted. Anion gap greatly elevated, glucose stabilizer drip initiated for acidosis. Imaging and labs reviewed. Patient will be admitted to step down for further management and evaluation. Patient d/w with Dr. Darl Householder, agrees with plan.      KAILENE STEINHART, PA-C 02/07/13 0024

## 2013-02-06 NOTE — H&P (Signed)
History and Physical    Kirstein Erbacher [id number] DOB: 1965-01-29 DOA: 02/06/2013  Referring physician: Dr. Darl Householder PCP:  Melinda Crutch, MD  Specialists: none  Chief Complaint: Abdominal pain, nausea and vomiting  HPI: Veronica Fletcher is a 48 y.o. female has a past medical history significant for chronic anemia, diabetes mellitus, presents to the emergency room with a chief complaint of epigastric abdominal pain, nausea and vomiting for 2-3 days. Her symptoms have been progressively worse, and she decided to present to the emergency room today. She endorses flulike illness last week, now almost completely resolved. She denies any fever or chills. She denies any chest pain. She denies any diarrhea. She endorses constipation. She endorses tobacco abuse, denies alcohol abuse, has had a cholecystectomy in the past. In the emergency room, patient was found to have elevated blood sugars into the 300s, anion gap on blood work, and she also underwent a CT of the abdomen and pelvis which showed mild pancreatitis. Initial heart rate into the 140s, improved with fluids. Patient denies prior episodes of pancreatitis. She denies any lightheadedness or dizziness. She denies any weight gain or weight loss recently.  Review of Systems: As per history of present illness, otherwise negative  Past Medical History  Diagnosis Date  . Diabetes mellitus   . Hypertension    Past Surgical History  Procedure Laterality Date  . Cholecystectomy    . Laparoscopy    . Cesarean section    . Ganglion cyst excision     Social History:  reports that she quit smoking about 2 weeks ago. Her smoking use included Cigarettes. She smoked 0.50 packs per day. She has never used smokeless tobacco. She reports that she drinks alcohol. She reports that she does not use illicit drugs.  No Known Allergies  Family history noncontributory  Prior to Admission medications   Medication Sig Start Date End Date Taking? Authorizing  Provider  aspirin EC 81 MG tablet Take 81 mg by mouth every morning.   Yes Historical Provider, MD  bisacodyl (EX-LAX ULTRA) 5 MG EC tablet Take 5 mg by mouth daily as needed for mild constipation or moderate constipation.   Yes Historical Provider, MD  Diphenhydramine-APAP, sleep, (GOODY PM PO) Take 1 packet by mouth at bedtime as needed. For pain and sleep   Yes Historical Provider, MD  glimepiride (AMARYL) 2 MG tablet Take 1 tablet (2 mg total) by mouth daily with breakfast. 12/06/12  Yes Elayne Snare, MD  guaiFENesin (MUCINEX) 600 MG 12 hr tablet Take 600 mg by mouth 2 (two) times daily as needed for cough or to loosen phlegm.   Yes Historical Provider, MD  JARDIANCE 25 MG TABS Take 1 tablet by mouth every morning. 01/09/13  Yes Historical Provider, MD  Liraglutide (VICTOZA) 18 MG/3ML SOLN Inject 1.2 mg into the skin every morning.    Yes Historical Provider, MD  metFORMIN (GLUCOPHAGE-XR) 500 MG 24 hr tablet Take 1,000 mg by mouth 2 (two) times daily.   Yes Historical Provider, MD  Multiple Vitamins-Minerals (MULTIVITAMIN WITH MINERALS) tablet Take 1 tablet by mouth every morning. Patient takes Flinstones complete with Vitamin D   Yes Historical Provider, MD   Physical Exam: Filed Vitals:   02/06/13 1944 02/06/13 2013 02/06/13 2220 02/06/13 2222  BP: 172/114  176/99   Pulse: 144  102   Temp: 98.7 F (37.1 C)   97.9 F (36.6 C)  TempSrc: Oral   Oral  Resp:   19   Height: 5\' 2"  (1.575  m) 5\' 2"  (1.575 m)    Weight: 83.915 kg (185 lb) 81.647 kg (180 lb)    SpO2: 99%  100%      General:  In mild distress due to abdominal pain  Eyes: no scleral icterus  ENT: moist oropharynx  Neck: supple, no JVD  Cardiovascular: regular rate without MRG; 2+ peripheral pulses, tachycardic  Respiratory: CTA biL, good air movement without wheezing, rhonchi or crackled  Abdomen: Soft, tender to palpation in the midepigastric area, no rebound no guarding  Skin: no rashes  Musculoskeletal: no  peripheral edema  Psychiatric: normal mood and affect  Neurologic: Nonfocal  Labs on Admission:  Basic Metabolic Panel:  Recent Labs Lab 02/06/13 2027  NA 134*  K 4.0  CL 89*  CO2 24  GLUCOSE 350*  BUN 11  CREATININE 0.71  CALCIUM 9.5   Liver Function Tests:  Recent Labs Lab 02/06/13 2027  AST 18  ALT 25  ALKPHOS 98  BILITOT 0.5  PROT 8.7*  ALBUMIN 4.1    Recent Labs Lab 02/06/13 2027  LIPASE 138*   CBC:  Recent Labs Lab 02/06/13 2027  WBC 10.2  NEUTROABS 6.5  HGB 15.0  HCT 44.5  MCV 88.5  PLT PLATELET CLUMPS NOTED ON SMEAR, COUNT APPEARS ADEQUATE   CBG:  Recent Labs Lab 02/06/13 2001 02/06/13 2302  GLUCAP 368* 205*    Radiological Exams on Admission: Ct Abdomen Pelvis W Contrast  02/06/2013   CLINICAL DATA:  Abdominal pain, history of cholecystectomy.  EXAM: CT ABDOMEN AND PELVIS WITH CONTRAST  TECHNIQUE: Multidetector CT imaging of the abdomen and pelvis was performed using the standard protocol following bolus administration of intravenous contrast.  CONTRAST:  89mL OMNIPAQUE IOHEXOL 300 MG/ML SOLN, 178mL OMNIPAQUE IOHEXOL 300 MG/ML SOLN  COMPARISON:  02/06/2013 radiograph  FINDINGS: Mild linear opacity within the left lung base, favored to reflect atelectasis. Heart size within normal limits.  No appreciable abnormality of the liver, spleen, adrenal glands. There is mild stranding of the fat along the pancreaticoduodenal groove. Cholecystectomy. No biliary ductal dilatation. Symmetric renal enhancement. No hydroureteronephrosis.  No CT evidence for colitis. Normal appendix. Small bowel loops are of normal course and caliber. No free intraperitoneal air. No free fluid. Absent uterus. Thin walled bladder. No adnexal mass.  Normal caliber aorta and branch vessels.  Hypertrophic changes along the right supra-acetabular region is favored to reflect sequelae of prior avulsion injury. No acute osseous finding. Degenerative changes of the lower thoracic  spine.  IMPRESSION: Mild stranding along the pancreaticoduodenal groove may reflect a mild groove pancreatitis. Correlate with lipase and symptoms.   Electronically Signed   By: Carlos Levering M.D.   On: 02/06/2013 22:26   Dg Abd Acute W/chest  02/06/2013   CLINICAL DATA:  Abdominal pain  EXAM: ACUTE ABDOMEN SERIES (ABDOMEN 2 VIEW & CHEST 1 VIEW)  COMPARISON:  None.  FINDINGS: Mild linear opacity within the right greater than left lower lobes, favored to reflect atelectasis or scarring. Surgical clips right upper quadrant. Bowel gas pattern nonobstructive. Moderate stool burden. No free intraperitoneal air identified. Hypertrophic osseous changes arising from the region of the right anterior inferior iliac spine and extending inferiorly along the lateral margin of the right femoroacetabular joint and right femoral head.  IMPRESSION: Moderate stool burden.  Bowel gas pattern nonobstructive.  Hypertrophic right supra-acetabular osseous changes may reflect sequelae of remote trauma/ avulsion injury. Recommend clinical correlation and nonemergent dedicated right hip radiographs.   Electronically Signed   By: Mitzi Hansen  DelGaizo M.D.   On: 02/06/2013 21:01    EKG: Independently reviewed. Sinus tachycardia  Assessment/Plan Principal Problem:   Acute pancreatitis Active Problems:   Diabetes mellitus   Anemia   DKA (diabetic ketoacidoses)   Acute pancreatitis - With mild lipase elevation, symptoms consistent with pancreatitis and a positive CT scan. IV fluids, n.p.o., and pain control. Into the radiology, she denies heavy alcohol use, she has had a cholecystectomy in the past. Will check a lipid panel in the morning. ?medication induced, Victoza. Diabetes mellitus - for now on insulin drip. Hold home regimen. Hyperglycemia - will put on insulin drip given anion gap. Bicarbonate is normal. Will check A1C Anemia - hemoglobin on presentation 15.0. Previous hemoglobins as low as 6 or 7. Patient has a history  of fibroids and bleeding status post hysterectomy. She was given iron supplements, however this was discontinued. Continue to monitor after fluid hydration.    Diet: N.p.o.  Fluids: normal saline / D5 per protocol  DVT Prophylaxis: Lovenox   Code Status:  full code  Family Communication:  family in the room  Disposition Plan:  inpatient  Time spent: 58  Costin M. Cruzita Lederer, MD [facility] Pager [phone]  If 7PM-7AM, please contact night-coverage [email or web address] Password [facility] 02/06/2013, 11:21 PM

## 2013-02-07 ENCOUNTER — Encounter (HOSPITAL_COMMUNITY): Payer: Self-pay

## 2013-02-07 DIAGNOSIS — K859 Acute pancreatitis without necrosis or infection, unspecified: Principal | ICD-10-CM

## 2013-02-07 DIAGNOSIS — E119 Type 2 diabetes mellitus without complications: Secondary | ICD-10-CM

## 2013-02-07 LAB — GLUCOSE, CAPILLARY
GLUCOSE-CAPILLARY: 172 mg/dL — AB (ref 70–99)
GLUCOSE-CAPILLARY: 143 mg/dL — AB (ref 70–99)
GLUCOSE-CAPILLARY: 155 mg/dL — AB (ref 70–99)
GLUCOSE-CAPILLARY: 158 mg/dL — AB (ref 70–99)
Glucose-Capillary: 129 mg/dL — ABNORMAL HIGH (ref 70–99)
Glucose-Capillary: 130 mg/dL — ABNORMAL HIGH (ref 70–99)
Glucose-Capillary: 145 mg/dL — ABNORMAL HIGH (ref 70–99)
Glucose-Capillary: 158 mg/dL — ABNORMAL HIGH (ref 70–99)
Glucose-Capillary: 166 mg/dL — ABNORMAL HIGH (ref 70–99)
Glucose-Capillary: 173 mg/dL — ABNORMAL HIGH (ref 70–99)
Glucose-Capillary: 203 mg/dL — ABNORMAL HIGH (ref 70–99)
Glucose-Capillary: 216 mg/dL — ABNORMAL HIGH (ref 70–99)

## 2013-02-07 LAB — BASIC METABOLIC PANEL
BUN: 8 mg/dL (ref 6–23)
BUN: 4 mg/dL — ABNORMAL LOW (ref 6–23)
BUN: 5 mg/dL — AB (ref 6–23)
BUN: 6 mg/dL (ref 6–23)
CALCIUM: 8.5 mg/dL (ref 8.4–10.5)
CHLORIDE: 98 meq/L (ref 96–112)
CHLORIDE: 99 meq/L (ref 96–112)
CO2: 22 meq/L (ref 19–32)
CO2: 22 meq/L (ref 19–32)
CO2: 24 meq/L (ref 19–32)
CO2: 25 meq/L (ref 19–32)
CREATININE: 0.47 mg/dL — AB (ref 0.50–1.10)
CREATININE: 0.47 mg/dL — AB (ref 0.50–1.10)
Calcium: 7.6 mg/dL — ABNORMAL LOW (ref 8.4–10.5)
Calcium: 7.6 mg/dL — ABNORMAL LOW (ref 8.4–10.5)
Calcium: 7.8 mg/dL — ABNORMAL LOW (ref 8.4–10.5)
Chloride: 96 mEq/L (ref 96–112)
Chloride: 100 mEq/L (ref 96–112)
Creatinine, Ser: 0.55 mg/dL (ref 0.50–1.10)
Creatinine, Ser: 0.51 mg/dL (ref 0.50–1.10)
GFR calc Af Amer: >90 mL/min (ref >90)
GFR calc Af Amer: >90 mL/min (ref >90)
GFR calc Af Amer: >90 mL/min (ref >90)
GFR calc non Af Amer: >90 mL/min (ref >90)
GFR calc non Af Amer: >90 mL/min (ref >90)
GFR calc non Af Amer: >90 mL/min (ref >90)
GLUCOSE: 137 mg/dL — AB (ref 70–99)
GLUCOSE: 169 mg/dL — AB (ref 70–99)
Glucose, Bld: 223 mg/dL — ABNORMAL HIGH (ref 70–99)
Glucose, Bld: 181 mg/dL — ABNORMAL HIGH (ref 70–99)
POTASSIUM: 3.2 meq/L — AB (ref 3.7–5.3)
POTASSIUM: 3.7 meq/L (ref 3.7–5.3)
Potassium: 3.8 mEq/L (ref 3.7–5.3)
Potassium: 3.7 mEq/L (ref 3.7–5.3)
SODIUM: 137 meq/L (ref 137–147)
Sodium: 136 mEq/L — ABNORMAL LOW (ref 137–147)
Sodium: 136 mEq/L — ABNORMAL LOW (ref 137–147)
Sodium: 133 mEq/L — ABNORMAL LOW (ref 137–147)

## 2013-02-07 LAB — URINE MICROSCOPIC-ADD ON

## 2013-02-07 LAB — LIPID PANEL
Cholesterol: 165 mg/dL (ref 0–200)
HDL: 35 mg/dL — AB (ref >39)
LDL Cholesterol: 110 mg/dL — ABNORMAL HIGH (ref 0–99)
TRIGLYCERIDES: 102 mg/dL (ref <150)
Total CHOL/HDL Ratio: 4.7 RATIO
VLDL: 20 mg/dL (ref 0–40)

## 2013-02-07 LAB — HEMOGLOBIN A1C
Hgb A1c MFr Bld: 10 % — ABNORMAL HIGH (ref <5.7)
Mean Plasma Glucose: 240 mg/dL — ABNORMAL HIGH (ref <117)

## 2013-02-07 LAB — URINALYSIS, ROUTINE W REFLEX MICROSCOPIC
BILIRUBIN URINE: NEGATIVE
KETONES UR: 40 mg/dL — AB
Leukocytes, UA: NEGATIVE
Nitrite: NEGATIVE
PH: 7 (ref 5.0–8.0)
Protein, ur: NEGATIVE mg/dL
Specific Gravity, Urine: >1.046 — ABNORMAL HIGH (ref 1.005–1.030)
Urobilinogen, UA: 1 mg/dL (ref 0.0–1.0)

## 2013-02-07 LAB — MRSA PCR SCREENING: MRSA BY PCR: NEGATIVE

## 2013-02-07 MED ORDER — CHLORHEXIDINE GLUCONATE 0.12 % MT SOLN
15.0000 mL | Freq: Two times a day (BID) | OROMUCOSAL | Status: DC
  Administered 2013-02-07 – 2013-02-09 (×4): 15 mL via OROMUCOSAL
  Filled 2013-02-07 (×6): qty 15

## 2013-02-07 MED ORDER — INSULIN GLARGINE 100 UNIT/ML ~~LOC~~ SOLN
10.0000 [IU] | Freq: Every day | SUBCUTANEOUS | Status: DC
  Administered 2013-02-07 – 2013-02-08 (×2): 10 [IU] via SUBCUTANEOUS
  Filled 2013-02-07 (×4): qty 0.1

## 2013-02-07 MED ORDER — INSULIN ASPART 100 UNIT/ML ~~LOC~~ SOLN
0.0000 [IU] | SUBCUTANEOUS | Status: DC
  Administered 2013-02-07: 3 [IU] via SUBCUTANEOUS
  Administered 2013-02-07: 2 [IU] via SUBCUTANEOUS
  Administered 2013-02-07: 5 [IU] via SUBCUTANEOUS
  Administered 2013-02-07 – 2013-02-08 (×2): 3 [IU] via SUBCUTANEOUS
  Administered 2013-02-08: 2 [IU] via SUBCUTANEOUS
  Administered 2013-02-08 (×2): 3 [IU] via SUBCUTANEOUS
  Administered 2013-02-09: 2 [IU] via SUBCUTANEOUS

## 2013-02-07 MED ORDER — ONDANSETRON HCL 4 MG/2ML IJ SOLN
4.0000 mg | Freq: Four times a day (QID) | INTRAMUSCULAR | Status: DC | PRN
  Administered 2013-02-07 – 2013-02-09 (×4): 4 mg via INTRAVENOUS
  Filled 2013-02-07 (×4): qty 2

## 2013-02-07 MED ORDER — HYDRALAZINE HCL 20 MG/ML IJ SOLN
5.0000 mg | Freq: Four times a day (QID) | INTRAMUSCULAR | Status: DC | PRN
  Administered 2013-02-07 – 2013-02-08 (×2): 5 mg via INTRAVENOUS
  Filled 2013-02-07: qty 1

## 2013-02-07 MED ORDER — BIOTENE DRY MOUTH MT LIQD
15.0000 mL | Freq: Two times a day (BID) | OROMUCOSAL | Status: DC
  Administered 2013-02-07 – 2013-02-08 (×4): 15 mL via OROMUCOSAL

## 2013-02-07 MED ORDER — HYDRALAZINE HCL 20 MG/ML IJ SOLN
INTRAMUSCULAR | Status: AC
  Administered 2013-02-07: 5 mg via INTRAVENOUS
  Filled 2013-02-07: qty 1

## 2013-02-07 MED ORDER — ASPIRIN EC 81 MG PO TBEC
81.0000 mg | DELAYED_RELEASE_TABLET | Freq: Every morning | ORAL | Status: DC
  Administered 2013-02-07 – 2013-02-09 (×3): 81 mg via ORAL
  Filled 2013-02-07 (×3): qty 1

## 2013-02-07 MED ORDER — RAMIPRIL 2.5 MG PO CAPS
2.5000 mg | ORAL_CAPSULE | Freq: Every day | ORAL | Status: DC
  Administered 2013-02-08 – 2013-02-09 (×2): 2.5 mg via ORAL
  Filled 2013-02-07 (×2): qty 1

## 2013-02-07 MED ORDER — CARVEDILOL 3.125 MG PO TABS
3.1250 mg | ORAL_TABLET | Freq: Every day | ORAL | Status: DC
  Administered 2013-02-08 – 2013-02-09 (×2): 3.125 mg via ORAL
  Filled 2013-02-07 (×3): qty 1

## 2013-02-07 MED ORDER — POTASSIUM CHLORIDE 10 MEQ/100ML IV SOLN
10.0000 meq | INTRAVENOUS | Status: AC
  Administered 2013-02-07 (×2): 10 meq via INTRAVENOUS
  Filled 2013-02-07: qty 100

## 2013-02-07 NOTE — ED Notes (Signed)
Pt states she feels nauseous 

## 2013-02-07 NOTE — Progress Notes (Signed)
Inpatient Diabetes Program Recommendations  AACE/ADA: New Consensus Statement on Inpatient Glycemic Control (2013)  Target Ranges:  Prepandial:   less than 140 mg/dL      Peak postprandial:   less than 180 mg/dL (1-2 hours)      Critically ill patients:  140 - 180 mg/dL   Reason for Visit: Hyperglycemia  Diabetes history: Type 2 Outpatient Diabetes medications: Victoza 1.2 mg QD, metformin 1000mg  bid, Jardiance 25 mg QD, Amaryl 2 mg QAM Current orders for Inpatient glycemic control: Lantus 10 units QAM, Novolog mod Q4 hours   Inpatient Diabetes Program Recommendations Insulin - Basal: Increase Lantus to 15 units QAM Correction (SSI): Novolog moderate Q4 hours then tidwc and hs Insulin - Meal Coverage: When pt is eating CHO mod diet, consider addition of meal coverage insulin - Novolog 4 units tidwc HgbA1C: 10.0% - uncontrolled Outpatient Referral: OP Diabetes Education consult Diet: When advanced, CHO mod med  Note: Needs diabetes education while inpatient.  Will order Living Well book, encourage pt to view diabetes videos on pt ed channel. Will f/u in am with questions/concerns.  Thank you. Lorenda Peck, RD, LDN, CDE Inpatient Diabetes Coordinator [phone]

## 2013-02-07 NOTE — Progress Notes (Signed)
[facility] PROGRESS NOTE  Inge Waldroup HCW:[id number] DOB: 1965-08-02 DOA: 02/06/2013 PCP:  Melinda Crutch, MD  Assessment/Plan: Acute pancreatitis  - Presenting with abdominal pain nausea vomiting and With mild lipase elevation on admission, symptoms consistent with pancreatitis and a positive CT scan for mild pancreatitis., she denies heavy alcohol use, she has had a cholecystectomy in the past.  -Lipid panel is unremarkable-triglycerides 102 ?medication induced, Victoza.  -Continue hydration with IV fluids, pain management, follow -Clinically improving this a.m., will start on clears follow recheck lipase in a.m. DKA/uncontrolled diabetes mellitus -patient's in a.m. On admission anion gap was 21 and was placed on iv insulin -gap still 16 at 7:25 am, but pt had already been transitioned to lantus>> will continue to monitor CBGs and the meds closely and may need IV insulin restart it if anion gap persists on followup.  Anemia  - hemoglobin on presentation 15.0.   -Continue to monitor after fluid hydration, follow and recheck..   Code Status: full Family Communication: None at the bedside Disposition Plan: Monitor and step down today   Consultants:  None  Procedures:  None  Antibiotics:  None  HPI/Subjective: Patient states still some right-sided abdominal pain, denies nausea or vomiting today  Objective: Filed Vitals:   02/07/13 0634  BP: 158/83  Pulse:   Temp: 98.2 F (36.8 C)  Resp: 23    Intake/Output Summary (Last 24 hours) at 02/07/13 1041 Last data filed at 02/07/13 0700  Gross per 24 hour  Intake 1258.9 ml  Output      0 ml  Net 1258.9 ml   Filed Weights   02/06/13 1944 02/06/13 2013 02/07/13 0634  Weight: 83.915 kg (185 lb) 81.647 kg (180 lb) 80.604 kg (177 lb 11.2 oz)    Exam:  General: alert & oriented x 3 In NAD Cardiovascular: RRR, nl S1 s2 Respiratory: CTAB Abdomen: soft +BS at the gastric and right upper quadrant tenderness/ND, no  masses palpable Extremities: No cyanosis and no edema    Data Reviewed: Basic Metabolic Panel:  Recent Labs Lab 02/06/13 2027 02/07/13 0030 02/07/13 0405 02/07/13 0729  NA 134* 137 136* 136*  K 4.0 3.8 3.2* 3.7  CL 89* 96 100 98  CO2 24 24 25 22   GLUCOSE 350* 223* 137* 169*  BUN 11 8 6  5*  CREATININE 0.71 0.55 0.51 0.47*  CALCIUM 9.5 8.5 7.6* 7.6*   Liver Function Tests:  Recent Labs Lab 02/06/13 2027  AST 18  ALT 25  ALKPHOS 98  BILITOT 0.5  PROT 8.7*  ALBUMIN 4.1    Recent Labs Lab 02/06/13 2027  LIPASE 138*   No results found for this basename: AMMONIA,  in the last 168 hours CBC:  Recent Labs Lab 02/06/13 2027  WBC 10.2  NEUTROABS 6.5  HGB 15.0  HCT 44.5  MCV 88.5  PLT PLATELET CLUMPS NOTED ON SMEAR, COUNT APPEARS ADEQUATE   Cardiac Enzymes: No results found for this basename: CKTOTAL, CKMB, CKMBINDEX, TROPONINI,  in the last 168 hours BNP (last 3 results) No results found for this basename: PROBNP,  in the last 8760 hours CBG:  Recent Labs Lab 02/07/13 0203 02/07/13 0303 02/07/13 0410 02/07/13 0506 02/07/13 0750  GLUCAP 172* 143* 129* 130* 158*    No results found for this or any previous visit (from the past 240 hour(s)).   Studies: Ct Abdomen Pelvis W Contrast  02/06/2013   CLINICAL DATA:  Abdominal pain, history of cholecystectomy.  EXAM: CT ABDOMEN AND PELVIS WITH CONTRAST  TECHNIQUE: Multidetector CT imaging of the abdomen and pelvis was performed using the standard protocol following bolus administration of intravenous contrast.  CONTRAST:  45mL OMNIPAQUE IOHEXOL 300 MG/ML SOLN, 151mL OMNIPAQUE IOHEXOL 300 MG/ML SOLN  COMPARISON:  02/06/2013 radiograph  FINDINGS: Mild linear opacity within the left lung base, favored to reflect atelectasis. Heart size within normal limits.  No appreciable abnormality of the liver, spleen, adrenal glands. There is mild stranding of the fat along the pancreaticoduodenal groove. Cholecystectomy. No  biliary ductal dilatation. Symmetric renal enhancement. No hydroureteronephrosis.  No CT evidence for colitis. Normal appendix. Small bowel loops are of normal course and caliber. No free intraperitoneal air. No free fluid. Absent uterus. Thin walled bladder. No adnexal mass.  Normal caliber aorta and branch vessels.  Hypertrophic changes along the right supra-acetabular region is favored to reflect sequelae of prior avulsion injury. No acute osseous finding. Degenerative changes of the lower thoracic spine.  IMPRESSION: Mild stranding along the pancreaticoduodenal groove may reflect a mild groove pancreatitis. Correlate with lipase and symptoms.   Electronically Signed   By: Carlos Levering M.D.   On: 02/06/2013 22:26   Dg Abd Acute W/chest  02/06/2013   CLINICAL DATA:  Abdominal pain  EXAM: ACUTE ABDOMEN SERIES (ABDOMEN 2 VIEW & CHEST 1 VIEW)  COMPARISON:  None.  FINDINGS: Mild linear opacity within the right greater than left lower lobes, favored to reflect atelectasis or scarring. Surgical clips right upper quadrant. Bowel gas pattern nonobstructive. Moderate stool burden. No free intraperitoneal air identified. Hypertrophic osseous changes arising from the region of the right anterior inferior iliac spine and extending inferiorly along the lateral margin of the right femoroacetabular joint and right femoral head.  IMPRESSION: Moderate stool burden.  Bowel gas pattern nonobstructive.  Hypertrophic right supra-acetabular osseous changes may reflect sequelae of remote trauma/ avulsion injury. Recommend clinical correlation and nonemergent dedicated right hip radiographs.   Electronically Signed   By: Carlos Levering M.D.   On: 02/06/2013 21:01    Scheduled Meds: . antiseptic oral rinse  15 mL Mouth Rinse q12n4p  . chlorhexidine  15 mL Mouth Rinse BID  . enoxaparin (LOVENOX) injection  40 mg Subcutaneous QHS  . insulin aspart  0-15 Units Subcutaneous Q4H  . insulin glargine  10 Units Subcutaneous Daily   . insulin regular  0-10 Units Intravenous TID WC   Continuous Infusions: . sodium chloride Stopped (02/07/13 0235)  . sodium chloride 100 mL/hr at 02/07/13 0500  . dextrose 5 % and 0.45% NaCl Stopped (02/06/13 2331)  . insulin (NOVOLIN-R) infusion Stopped (02/07/13 0515)    Principal Problem:   Acute pancreatitis Active Problems:   Diabetes mellitus   Anemia   DKA (diabetic ketoacidoses)    Time spent: [facility] Pager [phone]. If 7PM-7AM, please contact night-coverage at [email or web address], password [facility] 02/07/2013, 10:41 AM  LOS: 1 day

## 2013-02-07 NOTE — Progress Notes (Signed)
CARE MANAGEMENT NOTE 02/07/2013  Patient:  Veronica Fletcher, Veronica Fletcher   Account Number:  [id number]  Date Initiated:  02/07/2013  Documentation initiated by:  Ellon Marasco  Subjective/Objective Assessment:   pt with hx of mutiple medical problems, 2-3 days of nausea and vomiting bld glucose elevated and strated on iv insulin drip./tachycardic on admit./poss pancreatitis , poss, uti     Action/Plan:   home when stable, lives alone, does have family support group.   Anticipated DC Date:  02/10/2013   Anticipated DC Plan:  HOME/SELF CARE  In-house referral  NA      DC Planning Services  NA      [facility] Choice  NA   Choice offered to / List presented to:  NA   DME arranged  NA      DME agency  NA     [place] arranged  NA      [place] agency  NA   Status of service:  In process, will continue to follow Medicare Important Message given?  NA - LOS <3 / Initial given by admissions (If response is "NO", the following Medicare IM given date fields will be blank) Date Medicare IM given:   Date Additional Medicare IM given:    Discharge Disposition:    Per UR Regulation:  Reviewed for med. necessity/level of care/duration of stay  If discussed at [place] of Stay Meetings, dates discussed:    Comments:  [id number]/Luie Laneve Eldridge Dace, BSN, [place] [phone] Chart Reviewed for discharge and hospital needs. Discharge needs at time of review:  None present will follow for needs. Review of patient progress due on [id number].

## 2013-02-08 DIAGNOSIS — D649 Anemia, unspecified: Secondary | ICD-10-CM

## 2013-02-08 LAB — GLUCOSE, CAPILLARY
GLUCOSE-CAPILLARY: 175 mg/dL — AB (ref 70–99)
GLUCOSE-CAPILLARY: 103 mg/dL — AB (ref 70–99)
GLUCOSE-CAPILLARY: 158 mg/dL — AB (ref 70–99)
Glucose-Capillary: 101 mg/dL — ABNORMAL HIGH (ref 70–99)
Glucose-Capillary: 102 mg/dL — ABNORMAL HIGH (ref 70–99)
Glucose-Capillary: 128 mg/dL — ABNORMAL HIGH (ref 70–99)
Glucose-Capillary: 154 mg/dL — ABNORMAL HIGH (ref 70–99)
Glucose-Capillary: 169 mg/dL — ABNORMAL HIGH (ref 70–99)
Glucose-Capillary: 207 mg/dL — ABNORMAL HIGH (ref 70–99)

## 2013-02-08 LAB — BASIC METABOLIC PANEL
BUN: <3 mg/dL — ABNORMAL LOW (ref 6–23)
CHLORIDE: 105 meq/L (ref 96–112)
CO2: 24 mEq/L (ref 19–32)
Calcium: 8 mg/dL — ABNORMAL LOW (ref 8.4–10.5)
Creatinine, Ser: 0.5 mg/dL (ref 0.50–1.10)
GFR calc Af Amer: >90 mL/min (ref >90)
GFR calc non Af Amer: >90 mL/min (ref >90)
Glucose, Bld: 109 mg/dL — ABNORMAL HIGH (ref 70–99)
Potassium: 4.1 mEq/L (ref 3.7–5.3)
Sodium: 141 mEq/L (ref 137–147)

## 2013-02-08 LAB — CBC
HEMATOCRIT: 33.6 % — AB (ref 36.0–46.0)
Hemoglobin: 11.4 g/dL — ABNORMAL LOW (ref 12.0–15.0)
MCH: 30.3 pg (ref 26.0–34.0)
MCHC: 33.9 g/dL (ref 30.0–36.0)
MCV: 89.4 fL (ref 78.0–100.0)
Platelets: 303 10*3/uL (ref 150–400)
RBC: 3.76 MIL/uL — ABNORMAL LOW (ref 3.87–5.11)
RDW: 12.6 % (ref 11.5–15.5)
WBC: 8.8 10*3/uL (ref 4.0–10.5)

## 2013-02-08 LAB — LIPASE, BLOOD: LIPASE: 46 U/L (ref 11–59)

## 2013-02-08 NOTE — Progress Notes (Signed)
Inpatient Diabetes Program Recommendations  AACE/ADA: New Consensus Statement on Inpatient Glycemic Control (2013)  Target Ranges:  Prepandial:   less than 140 mg/dL      Peak postprandial:   less than 180 mg/dL (1-2 hours)      Critically ill patients:  140 - 180 mg/dL   Reason for Visit: HgbA1C >6.5%  Results for Veronica Fletcher, Veronica Fletcher (MRN [id number]) as of 02/08/2013 12:13  Ref. Range 02/06/2013 23:40  Hemoglobin A1C Latest Range: <5.7 % 10.0 (H)   Pt will likely need to go home on insulin since HgbA1C is high with OHAs. If so, will order insulin starter kit and begin teaching insulin administration. Blood sugars well controlled here in hospital with Lantus and Novolog.  Thank you. Rhonda Vaughan, RD, LDN, CDE Inpatient Diabetes Coordinator [phone]       

## 2013-02-08 NOTE — Plan of Care (Signed)
Problem: Food- and Nutrition-Related Knowledge Deficit (NB-1.1) Goal: Nutrition education Formal process to instruct or train a patient/client in a skill or to impart knowledge to help patients/clients voluntarily manage or modify food choices and eating behavior to maintain or improve health. Outcome: Completed/Met Date Met:  02/08/13 Pt was consulted for a pancreatitis/low fat diet. Handout about pancreatitis nutrition therapy was given to the pt.   Pt was educated on a low fat diet and how it would better fit with her pancreatitis. Education placed emphasis on choosing low fat or non fat foods, such as low fat/non fat dairy products. Pt was educated that her usual diet of consuming canned progressive soup (corn chowder) can contain a lot of fat and was educated to switch to a lower fat option, such as broth based soups. Pt was educated on avoiding alcoholic beverages as that can exacerbate the pancreas. Education was provided on switching cheddar cheese to a more lower content cheese, such as mozzarella cheese. Also recommended to avoid fried foods and high fat meats (bacon and sausage). Encouraged pt to continue to consume fiber while on a low fat diet. Pt was provided information about RD contact information.  Teach back method was used. Except good compliance from pt.    I agree with education note completed by Kallie Locks, Tavares Sutcliffe Clinical Dietitian [phone]

## 2013-02-08 NOTE — Progress Notes (Signed)
CARE MANAGEMENT NOTE 02/08/2013  Patient:  Veronica Fletcher, Veronica Fletcher   Account Number:  [id number]  Date Initiated:  02/07/2013  Documentation initiated by:  DAVIS,RHONDA  Subjective/Objective Assessment:   pt with hx of multiple medical problems, 2-3 days of nausea and vomiting bld glucose elevated and started on iv insulin drip./tachycardic on admit./poss pancreatitis , poss, uti     Action/Plan:   home when stable, lives alone, does have family support group.   Anticipated DC Date:  02/10/2013   Anticipated DC Plan:  HOME/SELF CARE  In-house referral  NA      DC Planning Services  NA      [facility] Choice  NA   Choice offered to / List presented to:  NA   DME arranged  NA      DME agency  NA     [place] arranged  NA      [place] agency  NA   Status of service:  In process, will continue to follow Medicare Important Message given?  NA - LOS <3 / Initial given by admissions (If response is "NO", the following Medicare IM given date fields will be blank) Date Medicare IM given:   Date Additional Medicare IM given:    Discharge Disposition:    Per UR Regulation:  Reviewed for med. necessity/level of care/duration of stay  If discussed at [place] of Stay Meetings, dates discussed:    Comments:  [id number]/Rhonda Rosana Hoes, RN, BSN, CCM, [phone] Chart reviewed for update of needs and condition/pt being moved to [place] [id number]/pt is very involved in her progress and future care/pt has requested any information on diet and lifestyle that she can get in order to be healthy in the future.  Dietary consult ordered.  [id number]/PJASNK Rosana Hoes, RN, BSN, CCM [phone] Chart Reviewed for discharge and hospital needs. Discharge needs at time of review:  None present will follow for needs. Review of patient progress due on [id number].

## 2013-02-08 NOTE — Progress Notes (Signed)
Received patient from ICU.  NADN.  No change in patient assessment.  Pt ambulatory in room

## 2013-02-08 NOTE — Progress Notes (Addendum)
[facility] PROGRESS NOTE  Veronica Fletcher [id number] DOB: Nov 20, 1965 DOA: 02/06/2013 PCP:  Melinda Crutch, MD  Assessment/Plan: Acute pancreatitis  - Presenting with abdominal pain nausea vomiting and With mild lipase elevation on admission, symptoms consistent with pancreatitis and a positive CT scan for mild pancreatitis., she denies heavy alcohol use, she has had a cholecystectomy in the past.  -Lipid panel is unremarkable-triglycerides 102 ?medication induced, Victoza.  -Continue hydration with IV fluids, pain management, follow -Lipase normalized this a.m., advance diet as tolerated DKA/uncontrolled diabetes mellitus -patient's anion gap admission was 21 and was placed on iv insulin -AG pt now on lantus with with acute blood glucose control -Given that Victoza is the possible etiology of her pancreatitis (other workup negative as above), and recommend discontinuing of the Victoza and I have discussed this with patient -Continue current Lantus, follow and adjust dose as her diet is advanced as clinically appropriate Anemia  - hemoglobin on presentation 15.0.   -hgb 11.4, with hydration no gross bleeding follow. Hypertension -Continue Coreg and Altace  Code Status: full Family Communication: None at the bedside Disposition Plan: Awaiting transfer to floor   Consultants:  None  Procedures:  None  Antibiotics:  None  HPI/Subjective: Reports some pain overnight but better this a.m. denies nausea vomiting. Tolerating clears.  Objective: Filed Vitals:   02/08/13 0800  BP:   Pulse:   Temp: 98.2 F (36.8 C)  Resp:     Intake/Output Summary (Last 24 hours) at 02/08/13 0918 Last data filed at 02/08/13 0600  Gross per 24 hour  Intake   3360 ml  Output   3350 ml  Net     10 ml   Filed Weights   02/06/13 1944 02/06/13 2013 02/07/13 0634  Weight: 83.915 kg (185 lb) 81.647 kg (180 lb) 80.604 kg (177 lb 11.2 oz)    Exam:  General: alert & oriented x 3 In  NAD Cardiovascular: RRR, nl S1 s2 Respiratory: CTAB Abdomen: soft +BS decreased epigastric and right upper quadrant tenderness/ND, no masses palpable Extremities: No cyanosis and no edema    Data Reviewed: Basic Metabolic Panel:  Recent Labs Lab 02/07/13 0030 02/07/13 0405 02/07/13 0729 02/07/13 1531 02/08/13 0315  NA 137 136* 136* 133* 141  K 3.8 3.2* 3.7 3.7 4.1  CL 96 100 98 99 105  CO2 24 25 22 22 24   GLUCOSE 223* 137* 169* 181* 109*  BUN 8 6 5* 4* <3*  CREATININE 0.55 0.51 0.47* 0.47* 0.50  CALCIUM 8.5 7.6* 7.6* 7.8* 8.0*   Liver Function Tests:  Recent Labs Lab 02/06/13 2027  AST 18  ALT 25  ALKPHOS 98  BILITOT 0.5  PROT 8.7*  ALBUMIN 4.1    Recent Labs Lab 02/06/13 2027 02/08/13 0315  LIPASE 138* 46   No results found for this basename: AMMONIA,  in the last 168 hours CBC:  Recent Labs Lab 02/06/13 2027 02/08/13 0315  WBC 10.2 8.8  NEUTROABS 6.5  --   HGB 15.0 11.4*  HCT 44.5 33.6*  MCV 88.5 89.4  PLT PLATELET CLUMPS NOTED ON SMEAR, COUNT APPEARS ADEQUATE 303   Cardiac Enzymes: No results found for this basename: CKTOTAL, CKMB, CKMBINDEX, TROPONINI,  in the last 168 hours BNP (last 3 results) No results found for this basename: PROBNP,  in the last 8760 hours CBG:  Recent Labs Lab 02/07/13 1900 02/07/13 2101 02/07/13 2352 02/08/13 0315 02/08/13 0746  GLUCAP 175* 207* 102* 103* 101*    Recent Results (from the past  240 hour(s))  MRSA PCR SCREENING     Status: None   Collection Time    02/07/13  7:02 AM      Result Value Range Status   MRSA by PCR NEGATIVE  NEGATIVE Final   Comment:            The GeneXpert MRSA Assay (FDA     approved for NASAL specimens     only), is one component of a     comprehensive MRSA colonization     surveillance program. It is not     intended to diagnose MRSA     infection nor to guide or     monitor treatment for     MRSA infections.     Studies: Ct Abdomen Pelvis W Contrast  02/06/2013    CLINICAL DATA:  Abdominal pain, history of cholecystectomy.  EXAM: CT ABDOMEN AND PELVIS WITH CONTRAST  TECHNIQUE: Multidetector CT imaging of the abdomen and pelvis was performed using the standard protocol following bolus administration of intravenous contrast.  CONTRAST:  61mL OMNIPAQUE IOHEXOL 300 MG/ML SOLN, 179mL OMNIPAQUE IOHEXOL 300 MG/ML SOLN  COMPARISON:  02/06/2013 radiograph  FINDINGS: Mild linear opacity within the left lung base, favored to reflect atelectasis. Heart size within normal limits.  No appreciable abnormality of the liver, spleen, adrenal glands. There is mild stranding of the fat along the pancreaticoduodenal groove. Cholecystectomy. No biliary ductal dilatation. Symmetric renal enhancement. No hydroureteronephrosis.  No CT evidence for colitis. Normal appendix. Small bowel loops are of normal course and caliber. No free intraperitoneal air. No free fluid. Absent uterus. Thin walled bladder. No adnexal mass.  Normal caliber aorta and branch vessels.  Hypertrophic changes along the right supra-acetabular region is favored to reflect sequelae of prior avulsion injury. No acute osseous finding. Degenerative changes of the lower thoracic spine.  IMPRESSION: Mild stranding along the pancreaticoduodenal groove may reflect a mild groove pancreatitis. Correlate with lipase and symptoms.   Electronically Signed   By: Carlos Levering M.D.   On: 02/06/2013 22:26   Dg Abd Acute W/chest  02/06/2013   CLINICAL DATA:  Abdominal pain  EXAM: ACUTE ABDOMEN SERIES (ABDOMEN 2 VIEW & CHEST 1 VIEW)  COMPARISON:  None.  FINDINGS: Mild linear opacity within the right greater than left lower lobes, favored to reflect atelectasis or scarring. Surgical clips right upper quadrant. Bowel gas pattern nonobstructive. Moderate stool burden. No free intraperitoneal air identified. Hypertrophic osseous changes arising from the region of the right anterior inferior iliac spine and extending inferiorly along the lateral  margin of the right femoroacetabular joint and right femoral head.  IMPRESSION: Moderate stool burden.  Bowel gas pattern nonobstructive.  Hypertrophic right supra-acetabular osseous changes may reflect sequelae of remote trauma/ avulsion injury. Recommend clinical correlation and nonemergent dedicated right hip radiographs.   Electronically Signed   By: Carlos Levering M.D.   On: 02/06/2013 21:01    Scheduled Meds: . antiseptic oral rinse  15 mL Mouth Rinse q12n4p  . aspirin EC  81 mg Oral q morning - 10a  . carvedilol  3.125 mg Oral Q breakfast  . chlorhexidine  15 mL Mouth Rinse BID  . enoxaparin (LOVENOX) injection  40 mg Subcutaneous QHS  . insulin aspart  0-15 Units Subcutaneous Q4H  . insulin glargine  10 Units Subcutaneous Daily  . insulin regular  0-10 Units Intravenous TID WC  . ramipril  2.5 mg Oral Daily   Continuous Infusions: . sodium chloride Stopped (02/07/13 0235)  . sodium  chloride 150 mL/hr at 02/08/13 0318  . dextrose 5 % and 0.45% NaCl Stopped (02/06/13 2331)  . insulin (NOVOLIN-R) infusion Stopped (02/07/13 0515)    Principal Problem:   Acute pancreatitis Active Problems:   Diabetes mellitus   Anemia   DKA (diabetic ketoacidoses)    Time spent: [place] [facility] Pager [phone]. If 7PM-7AM, please contact night-coverage at [email or web address], password [facility] 02/08/2013, 9:18 AM  LOS: 2 days

## 2013-02-09 DIAGNOSIS — E118 Type 2 diabetes mellitus with unspecified complications: Secondary | ICD-10-CM

## 2013-02-09 DIAGNOSIS — I10 Essential (primary) hypertension: Secondary | ICD-10-CM | POA: Diagnosis present

## 2013-02-09 DIAGNOSIS — E1165 Type 2 diabetes mellitus with hyperglycemia: Secondary | ICD-10-CM | POA: Diagnosis present

## 2013-02-09 DIAGNOSIS — IMO0002 Reserved for concepts with insufficient information to code with codable children: Secondary | ICD-10-CM | POA: Diagnosis present

## 2013-02-09 DIAGNOSIS — N920 Excessive and frequent menstruation with regular cycle: Secondary | ICD-10-CM

## 2013-02-09 DIAGNOSIS — D250 Submucous leiomyoma of uterus: Secondary | ICD-10-CM

## 2013-02-09 LAB — BASIC METABOLIC PANEL
BUN: 4 mg/dL — AB (ref 6–23)
CHLORIDE: 103 meq/L (ref 96–112)
CO2: 25 mEq/L (ref 19–32)
Calcium: 8.2 mg/dL — ABNORMAL LOW (ref 8.4–10.5)
Creatinine, Ser: 0.55 mg/dL (ref 0.50–1.10)
GFR calc Af Amer: >90 mL/min (ref >90)
GFR calc non Af Amer: >90 mL/min (ref >90)
Glucose, Bld: 110 mg/dL — ABNORMAL HIGH (ref 70–99)
POTASSIUM: 3.5 meq/L — AB (ref 3.7–5.3)
Sodium: 138 mEq/L (ref 137–147)

## 2013-02-09 LAB — GLUCOSE, CAPILLARY
GLUCOSE-CAPILLARY: 123 mg/dL — AB (ref 70–99)
Glucose-Capillary: 113 mg/dL — ABNORMAL HIGH (ref 70–99)
Glucose-Capillary: 203 mg/dL — ABNORMAL HIGH (ref 70–99)

## 2013-02-09 MED ORDER — INSULIN GLARGINE 100 UNIT/ML SOLOSTAR PEN: 15.0000 [IU] | PEN_INJECTOR | Freq: Every day | SUBCUTANEOUS | Status: DC

## 2013-02-09 MED ORDER — INSULIN ASPART 100 UNIT/ML ~~LOC~~ SOLN
0.0000 [IU] | SUBCUTANEOUS | Status: DC
  Administered 2013-02-09: 7 [IU] via SUBCUTANEOUS

## 2013-02-09 MED ORDER — INSULIN GLARGINE 100 UNIT/ML ~~LOC~~ SOLN
15.0000 [IU] | Freq: Every day | SUBCUTANEOUS | Status: DC
  Administered 2013-02-09: 15 [IU] via SUBCUTANEOUS
  Filled 2013-02-09 (×2): qty 0.15

## 2013-02-09 MED ORDER — ATORVASTATIN CALCIUM 20 MG PO TABS
20.0000 mg | ORAL_TABLET | Freq: Every day | ORAL | Status: DC
  Filled 2013-02-09: qty 1

## 2013-02-09 MED ORDER — ATORVASTATIN CALCIUM 20 MG PO TABS: 20.0000 mg | ORAL_TABLET | Freq: Every day | ORAL | Status: DC

## 2013-02-09 NOTE — Discharge Summary (Signed)
Physician Discharge Summary  Veronica Fletcher DSK:[id number] DOB: 1965/06/24 DOA: 02/06/2013  PCP:  Melinda Crutch, MD  Admit date: 02/06/2013 Discharge date: 02/09/2013  Time spent: 40 minutes  Recommendations for Outpatient Follow-up:  Acute pancreatitis  -Lipid panel is unremarkable-triglycerides 102 , counseled most likely medication induced, Victoza. Counseled not to restart Victoza --Lipase normalized, and patient tolerating diet. Counseled to continue bland diet at home for at least 2 weeks    DKA/uncontrolled diabetes mellitus  -Hemoglobin A1c 02/07/2013= 10. Counseled her goal hemoglobin A1c is<7 - Discharge on Lantus 15 units QHS, and metformin 1000 mg BID -Counseled to followup with endocrinologist within one week in order to titrate new medications  Anemia  - hemoglobin on presentation 15.0.  -hgb 11.4, PCP to monitor    Hypertension  -Continue Coreg and Altace   HLD  -Not within ADA guidelines  -LDL goal<70 mg/dL  -Discharge on Lipitor 20 mg     Discharge Diagnoses:  Principal Problem:   Acute pancreatitis Active Problems:   Diabetes mellitus   Anemia   DKA (diabetic ketoacidoses)   HTN (hypertension)   Discharge Condition: Stable  Diet recommendation: Recommend diabetic Association; bland  Filed Weights   02/06/13 1944 02/06/13 2013 02/07/13 0634  Weight: 83.915 kg (185 lb) 81.647 kg (180 lb) 80.604 kg (177 lb 11.2 oz)    History of present illness:  Veronica Fletcher is a 48 y.o.BF PMHx chronic anemia, diabetes mellitus uncontrolled, HTN, HLD, menorrhagia, fibroids, chronic anemia, presents to the emergency room with a chief complaint of epigastric abdominal pain, nausea and vomiting for 2-3 days. Her symptoms have been progressively worse, and she decided to present to the emergency room today. She endorses flulike illness last week, now almost completely resolved. She denies any fever or chills. She denies any chest pain. She denies any diarrhea. She endorses  constipation. She endorses tobacco abuse, denies alcohol abuse, has had a cholecystectomy in the past. In the emergency room, patient was found to have elevated blood sugars into the 300s, anion gap on blood work, and she also underwent a CT of the abdomen and pelvis which showed mild pancreatitis. Initial heart rate into the 140s, improved with fluids. Patient denies prior episodes of pancreatitis. She denies any lightheadedness or dizziness. She denies any weight gain or weight loss recently. 1/28 patient states feeling greatly improved, tolerating carb modified diet with just some minor nausea. Negative vomiting, negative abdominal pain.   Procedures: CT abdomen pelvis with contrast 02/06/2013 Mild stranding along the pancreaticoduodenal groove may reflect a  mild groove pancreatitis. Correlate with lipase and symptoms.  Acute abdominal series with chest 02/06/2013 Moderate stool burden. Bowel gas pattern nonobstructive.  Hypertrophic right supra-acetabular osseous changes may reflect  sequelae of remote trauma/ avulsion injury. Recommend clinical  correlation and nonemergent dedicated right hip radiographs.   Consultations:    Antibiotics    Discharge Exam: Filed Vitals:   02/08/13 1200 02/08/13 2150 02/09/13 0100 02/09/13 0451  BP:  168/83 127/55 136/61  Pulse:  90 83 77  Temp: 98.3 F (36.8 C) 98.9 F (37.2 C)  99.2 F (37.3 C)  TempSrc: Oral Oral  Oral  Resp:  18  16  Height:      Weight:      SpO2:  100%  100%   General: alert & oriented x 4, NAD  Cardiovascular: RRR, nl S1 s2, negative murmurs rubs or gallops  Respiratory: CTAB  Abdomen: soft, mild epigastric tenderness, positive bowel sounds  Extremities: No cyanosis and  no edema  Discharge Instructions     Medication List    ASK your doctor about these medications       aspirin EC 81 MG tablet  Take 81 mg by mouth every morning.     carvedilol 3.125 MG tablet  Commonly known as:  COREG  Take 3.125 mg  by mouth daily.     EX-LAX ULTRA 5 MG EC tablet  Generic drug:  bisacodyl  Take 5 mg by mouth daily as needed for mild constipation or moderate constipation.     GOODY PM PO  Take 1 packet by mouth at bedtime as needed. For pain and sleep     guaiFENesin 600 MG 12 hr tablet  Commonly known as:  MUCINEX  Take 600 mg by mouth 2 (two) times daily as needed for cough or to loosen phlegm.     JARDIANCE 25 MG Tabs  Generic drug:  Empagliflozin  Take 1 tablet by mouth every morning.     metFORMIN 500 MG 24 hr tablet  Commonly known as:  GLUCOPHAGE-XR  Take 1,000 mg by mouth 2 (two) times daily.     multivitamin with minerals tablet  Take 1 tablet by mouth every morning. Patient takes Flinstones complete with Vitamin D     ramipril 2.5 MG capsule  Commonly known as:  ALTACE  Take 2.5 mg by mouth daily.     VICTOZA 18 MG/3ML Soln injection  Generic drug:  Liraglutide  Inject 1.2 mg into the skin every morning.       No Known Allergies    The results of significant diagnostics from this hospitalization (including imaging, microbiology, ancillary and laboratory) are listed below for reference.    Significant Diagnostic Studies: Ct Abdomen Pelvis W Contrast  02/06/2013   CLINICAL DATA:  Abdominal pain, history of cholecystectomy.  EXAM: CT ABDOMEN AND PELVIS WITH CONTRAST  TECHNIQUE: Multidetector CT imaging of the abdomen and pelvis was performed using the standard protocol following bolus administration of intravenous contrast.  CONTRAST:  63mL OMNIPAQUE IOHEXOL 300 MG/ML SOLN, 14mL OMNIPAQUE IOHEXOL 300 MG/ML SOLN  COMPARISON:  02/06/2013 radiograph  FINDINGS: Mild linear opacity within the left lung base, favored to reflect atelectasis. Heart size within normal limits.  No appreciable abnormality of the liver, spleen, adrenal glands. There is mild stranding of the fat along the pancreaticoduodenal groove. Cholecystectomy. No biliary ductal dilatation. Symmetric renal enhancement.  No hydroureteronephrosis.  No CT evidence for colitis. Normal appendix. Small bowel loops are of normal course and caliber. No free intraperitoneal air. No free fluid. Absent uterus. Thin walled bladder. No adnexal mass.  Normal caliber aorta and branch vessels.  Hypertrophic changes along the right supra-acetabular region is favored to reflect sequelae of prior avulsion injury. No acute osseous finding. Degenerative changes of the lower thoracic spine.  IMPRESSION: Mild stranding along the pancreaticoduodenal groove may reflect a mild groove pancreatitis. Correlate with lipase and symptoms.   Electronically Signed   By: Carlos Levering M.D.   On: 02/06/2013 22:26   Dg Abd Acute W/chest  02/06/2013   CLINICAL DATA:  Abdominal pain  EXAM: ACUTE ABDOMEN SERIES (ABDOMEN 2 VIEW & CHEST 1 VIEW)  COMPARISON:  None.  FINDINGS: Mild linear opacity within the right greater than left lower lobes, favored to reflect atelectasis or scarring. Surgical clips right upper quadrant. Bowel gas pattern nonobstructive. Moderate stool burden. No free intraperitoneal air identified. Hypertrophic osseous changes arising from the region of the right anterior inferior iliac spine and extending  inferiorly along the lateral margin of the right femoroacetabular joint and right femoral head.  IMPRESSION: Moderate stool burden.  Bowel gas pattern nonobstructive.  Hypertrophic right supra-acetabular osseous changes may reflect sequelae of remote trauma/ avulsion injury. Recommend clinical correlation and nonemergent dedicated right hip radiographs.   Electronically Signed   By: Carlos Levering M.D.   On: 02/06/2013 21:01    Microbiology: Recent Results (from the past 240 hour(s))  MRSA PCR SCREENING     Status: None   Collection Time    02/07/13  7:02 AM      Result Value Range Status   MRSA by PCR NEGATIVE  NEGATIVE Final   Comment:            The GeneXpert MRSA Assay (FDA     approved for NASAL specimens     only), is one  component of a     comprehensive MRSA colonization     surveillance program. It is not     intended to diagnose MRSA     infection nor to guide or     monitor treatment for     MRSA infections.     Labs: Basic Metabolic Panel:  Recent Labs Lab 02/07/13 0405 02/07/13 0729 02/07/13 1531 02/08/13 0315 02/09/13 0335  NA 136* 136* 133* 141 138  K 3.2* 3.7 3.7 4.1 3.5*  CL 100 98 99 105 103  CO2 25 22 22 24 25   GLUCOSE 137* 169* 181* 109* 110*  BUN 6 5* 4* <3* 4*  CREATININE 0.51 0.47* 0.47* 0.50 0.55  CALCIUM 7.6* 7.6* 7.8* 8.0* 8.2*   Liver Function Tests:  Recent Labs Lab 02/06/13 2027  AST 18  ALT 25  ALKPHOS 98  BILITOT 0.5  PROT 8.7*  ALBUMIN 4.1    Recent Labs Lab 02/06/13 2027 02/08/13 0315  LIPASE 138* 46   No results found for this basename: AMMONIA,  in the last 168 hours CBC:  Recent Labs Lab 02/06/13 2027 02/08/13 0315  WBC 10.2 8.8  NEUTROABS 6.5  --   HGB 15.0 11.4*  HCT 44.5 33.6*  MCV 88.5 89.4  PLT PLATELET CLUMPS NOTED ON SMEAR, COUNT APPEARS ADEQUATE 303   Cardiac Enzymes: No results found for this basename: CKTOTAL, CKMB, CKMBINDEX, TROPONINI,  in the last 168 hours BNP: BNP (last 3 results) No results found for this basename: PROBNP,  in the last 8760 hours CBG:  Recent Labs Lab 02/08/13 2023 02/08/13 2340 02/09/13 0447 02/09/13 0734 02/09/13 1200  GLUCAP 158* 154* 113* 123* 203*       Signed:  Dia Crawford, MD [facility] [phone] pager

## 2013-02-09 NOTE — Progress Notes (Addendum)
Patient was given hydralazine 25 mg per orders and after recheck patient BP  Is 127/55. Will cont to monitor.

## 2013-02-09 NOTE — ED Provider Notes (Signed)
Medical screening examination/treatment/procedure(s) were conducted as a shared visit with non-physician practitioner(s) and myself.  I personally evaluated the patient during the encounter.  EKG Interpretation    Date/Time:  Sunday February 06 2013 19:57:15 EST Ventricular Rate:  135 PR Interval:  134 QRS Duration: 63 QT Interval:  306 QTC Calculation: 459 R Axis:   54 Text Interpretation:  Sinus tachycardia Consider right atrial enlargement No previous ECGs available Confirmed by YAO  MD, DAVID [phone]) on 02/06/2013 8:06:38 PM            Veronica Fletcher is a 48 y.o. female hx of DM, HTN here with abdominal pain, vomiting. Symptoms for the last week. Was constipated for 2 days and was unable to have bowel movement. She also notes that her blood sugar has been elevated. CBG 368 initially. Abdomen distended, mildly diffusely tender. Xray showed no obvious SBO and lipase elevated so CT ab/pel ordered and showed mild pancreatitis. She also has mild anion gap so I ordered insulin drip for DKA. Patient admitted for DKA, pancreatitis.   CRITICAL CARE Performed by: Darl Householder, DAVID   Total critical care time: 30 min   Critical care time was exclusive of separately billable procedures and treating other patients.  Critical care was necessary to treat or prevent imminent or life-threatening deterioration.  Critical care was time spent personally by me on the following activities: development of treatment plan with patient and/or surrogate as well as nursing, discussions with consultants, evaluation of patient's response to treatment, examination of patient, obtaining history from patient or surrogate, ordering and performing treatments and interventions, ordering and review of laboratory studies, ordering and review of radiographic studies, pulse oximetry and re-evaluation of patient's condition.    Wandra Arthurs, MD 02/09/13 [phone]

## 2013-02-09 NOTE — Progress Notes (Signed)
Patient SBP was 168. Per orders will give hydralazine. And recheck.

## 2014-03-02 ENCOUNTER — Other Ambulatory Visit: Payer: Self-pay | Admitting: Internal Medicine

## 2014-03-02 DIAGNOSIS — R1011 Right upper quadrant pain: Secondary | ICD-10-CM

## 2014-03-02 DIAGNOSIS — K859 Acute pancreatitis, unspecified: Secondary | ICD-10-CM

## 2014-03-07 ENCOUNTER — Other Ambulatory Visit: Payer: Self-pay

## 2014-03-08 ENCOUNTER — Ambulatory Visit
Admission: RE | Admit: 2014-03-08 | Discharge: 2014-03-08 | Disposition: A | Discharge status: Home / Self Care | Payer: 59 | Source: Ambulatory Visit | Attending: Internal Medicine | Admitting: Internal Medicine

## 2014-03-08 DIAGNOSIS — R1011 Right upper quadrant pain: Secondary | ICD-10-CM

## 2014-03-08 DIAGNOSIS — K859 Acute pancreatitis, unspecified: Secondary | ICD-10-CM

## 2014-06-06 ENCOUNTER — Other Ambulatory Visit: Payer: Self-pay | Admitting: Obstetrics and Gynecology

## 2014-06-06 DIAGNOSIS — Z1231 Encounter for screening mammogram for malignant neoplasm of breast: Secondary | ICD-10-CM

## 2014-06-14 ENCOUNTER — Ambulatory Visit: Payer: 59

## 2014-06-14 ENCOUNTER — Ambulatory Visit
Admission: RE | Admit: 2014-06-14 | Discharge: 2014-06-14 | Disposition: A | Discharge status: Home / Self Care | Payer: 59 | Source: Ambulatory Visit | Attending: Obstetrics and Gynecology | Admitting: Obstetrics and Gynecology

## 2014-06-14 DIAGNOSIS — Z1231 Encounter for screening mammogram for malignant neoplasm of breast: Secondary | ICD-10-CM

## 2014-06-16 ENCOUNTER — Other Ambulatory Visit: Payer: Self-pay | Admitting: Obstetrics and Gynecology

## 2014-06-16 DIAGNOSIS — R928 Other abnormal and inconclusive findings on diagnostic imaging of breast: Secondary | ICD-10-CM

## 2014-06-22 ENCOUNTER — Ambulatory Visit
Admission: RE | Admit: 2014-06-22 | Discharge: 2014-06-22 | Disposition: A | Discharge status: Home / Self Care | Payer: 59 | Source: Ambulatory Visit | Attending: Obstetrics and Gynecology | Admitting: Obstetrics and Gynecology

## 2014-06-22 ENCOUNTER — Other Ambulatory Visit: Payer: Self-pay | Admitting: Obstetrics and Gynecology

## 2014-06-22 DIAGNOSIS — R928 Other abnormal and inconclusive findings on diagnostic imaging of breast: Secondary | ICD-10-CM

## 2014-06-29 ENCOUNTER — Other Ambulatory Visit: Payer: Self-pay | Admitting: Obstetrics and Gynecology

## 2014-06-29 DIAGNOSIS — R928 Other abnormal and inconclusive findings on diagnostic imaging of breast: Secondary | ICD-10-CM

## 2014-07-04 ENCOUNTER — Ambulatory Visit
Admission: RE | Admit: 2014-07-04 | Discharge: 2014-07-04 | Disposition: A | Discharge status: Home / Self Care | Payer: 59 | Source: Ambulatory Visit | Attending: Obstetrics and Gynecology | Admitting: Obstetrics and Gynecology

## 2014-07-04 ENCOUNTER — Inpatient Hospital Stay: Admission: RE | Admit: 2014-07-04 | Payer: 59 | Source: Ambulatory Visit

## 2014-07-04 DIAGNOSIS — R928 Other abnormal and inconclusive findings on diagnostic imaging of breast: Secondary | ICD-10-CM

## 2014-07-09 ENCOUNTER — Encounter (HOSPITAL_COMMUNITY): Payer: Self-pay | Admitting: Family Medicine

## 2014-07-09 ENCOUNTER — Emergency Department (HOSPITAL_COMMUNITY)
Admission: EM | Admit: 2014-07-09 | Discharge: 2014-07-10 | Disposition: A | Discharge status: Home / Self Care | Payer: 59 | Attending: Emergency Medicine | Admitting: Emergency Medicine

## 2014-07-09 DIAGNOSIS — Z9049 Acquired absence of other specified parts of digestive tract: Secondary | ICD-10-CM | POA: Diagnosis not present

## 2014-07-09 DIAGNOSIS — Z9889 Other specified postprocedural states: Secondary | ICD-10-CM | POA: Diagnosis not present

## 2014-07-09 DIAGNOSIS — R112 Nausea with vomiting, unspecified: Secondary | ICD-10-CM | POA: Insufficient documentation

## 2014-07-09 DIAGNOSIS — Z794 Long term (current) use of insulin: Secondary | ICD-10-CM | POA: Diagnosis not present

## 2014-07-09 DIAGNOSIS — Z87891 Personal history of nicotine dependence: Secondary | ICD-10-CM | POA: Diagnosis not present

## 2014-07-09 DIAGNOSIS — R1084 Generalized abdominal pain: Secondary | ICD-10-CM | POA: Insufficient documentation

## 2014-07-09 DIAGNOSIS — Z7982 Long term (current) use of aspirin: Secondary | ICD-10-CM | POA: Diagnosis not present

## 2014-07-09 DIAGNOSIS — E119 Type 2 diabetes mellitus without complications: Secondary | ICD-10-CM | POA: Diagnosis not present

## 2014-07-09 DIAGNOSIS — I10 Essential (primary) hypertension: Secondary | ICD-10-CM | POA: Diagnosis not present

## 2014-07-09 DIAGNOSIS — Z79899 Other long term (current) drug therapy: Secondary | ICD-10-CM | POA: Diagnosis not present

## 2014-07-09 LAB — CBC WITH DIFFERENTIAL/PLATELET
Basophils Absolute: 0 10*3/uL (ref 0.0–0.1)
Basophils Relative: 0 % (ref 0–1)
EOS PCT: 1 % (ref 0–5)
Eosinophils Absolute: 0.1 10*3/uL (ref 0.0–0.7)
HCT: 42.5 % (ref 36.0–46.0)
Hemoglobin: 14.3 g/dL (ref 12.0–15.0)
Lymphocytes Relative: 37 % (ref 12–46)
Lymphs Abs: 2.8 10*3/uL (ref 0.7–4.0)
MCH: 29.3 pg (ref 26.0–34.0)
MCHC: 33.6 g/dL (ref 30.0–36.0)
MCV: 87.1 fL (ref 78.0–100.0)
MONOS PCT: 6 % (ref 3–12)
Monocytes Absolute: 0.5 10*3/uL (ref 0.1–1.0)
Neutro Abs: 4.3 10*3/uL (ref 1.7–7.7)
Neutrophils Relative %: 56 % (ref 43–77)
Platelets: UNDETERMINED 10*3/uL (ref 150–400)
RBC: 4.88 MIL/uL (ref 3.87–5.11)
RDW: 12.5 % (ref 11.5–15.5)
WBC: 7.7 10*3/uL (ref 4.0–10.5)

## 2014-07-09 LAB — COMPREHENSIVE METABOLIC PANEL
ALT: 14 U/L (ref 14–54)
AST: 17 U/L (ref 15–41)
Albumin: 4.2 g/dL (ref 3.5–5.0)
Alkaline Phosphatase: 77 U/L (ref 38–126)
Anion gap: 10 (ref 5–15)
BILIRUBIN TOTAL: 1.1 mg/dL (ref 0.3–1.2)
BUN: 9 mg/dL (ref 6–20)
CO2: 25 mmol/L (ref 22–32)
Calcium: 9.2 mg/dL (ref 8.9–10.3)
Chloride: 99 mmol/L — ABNORMAL LOW (ref 101–111)
Creatinine, Ser: 0.65 mg/dL (ref 0.44–1.00)
GFR calc non Af Amer: >60 mL/min (ref >60)
Glucose, Bld: 272 mg/dL — ABNORMAL HIGH (ref 65–99)
Potassium: 3.6 mmol/L (ref 3.5–5.1)
Sodium: 134 mmol/L — ABNORMAL LOW (ref 135–145)
Total Protein: 8.2 g/dL — ABNORMAL HIGH (ref 6.5–8.1)

## 2014-07-09 LAB — I-STAT TROPONIN, ED: Troponin i, poc: 0 ng/mL (ref 0.00–0.08)

## 2014-07-09 LAB — LIPASE, BLOOD: Lipase: 33 U/L (ref 22–51)

## 2014-07-09 NOTE — ED Notes (Signed)
Patient complains of upper gastric pain that started Wednesday. Pt vomited once on Thursday night. Denies any fever, constipation, diarrhea, urinary symptoms or vaginal discharge. Pt states she took Tylenol PM 2 tablets at noon with minimal relief.

## 2014-07-10 ENCOUNTER — Other Ambulatory Visit: Payer: Self-pay

## 2014-07-10 ENCOUNTER — Emergency Department (HOSPITAL_COMMUNITY): Payer: 59

## 2014-07-10 LAB — URINE MICROSCOPIC-ADD ON

## 2014-07-10 LAB — URINALYSIS, ROUTINE W REFLEX MICROSCOPIC
Bilirubin Urine: NEGATIVE
Ketones, ur: 40 mg/dL — AB
Leukocytes, UA: NEGATIVE
Nitrite: NEGATIVE
PH: 6 (ref 5.0–8.0)
PROTEIN: 100 mg/dL — AB
Specific Gravity, Urine: 1.024 (ref 1.005–1.030)
UROBILINOGEN UA: 1 mg/dL (ref 0.0–1.0)

## 2014-07-10 MED ORDER — ONDANSETRON HCL 4 MG PO TABS: 4.0000 mg | ORAL_TABLET | Freq: Four times a day (QID) | ORAL | Status: DC

## 2014-07-10 MED ORDER — DICYCLOMINE HCL 20 MG PO TABS: 20.0000 mg | ORAL_TABLET | Freq: Two times a day (BID) | ORAL | Status: DC

## 2014-07-10 MED ORDER — HYDROCODONE-ACETAMINOPHEN 5-325 MG PO TABS: 1.0000 | ORAL_TABLET | ORAL | Status: DC | PRN

## 2014-07-10 MED ORDER — MORPHINE SULFATE 4 MG/ML IJ SOLN
4.0000 mg | Freq: Once | INTRAMUSCULAR | Status: AC
  Administered 2014-07-10: 4 mg via INTRAVENOUS

## 2014-07-10 MED ORDER — MORPHINE SULFATE 4 MG/ML IJ SOLN
INTRAMUSCULAR | Status: AC
  Filled 2014-07-10: qty 1

## 2014-07-10 MED ORDER — SODIUM CHLORIDE 0.9 % IV BOLUS (SEPSIS)
1000.0000 mL | Freq: Once | INTRAVENOUS | Status: AC
  Administered 2014-07-10: 1000 mL via INTRAVENOUS

## 2014-07-10 MED ORDER — ONDANSETRON HCL 4 MG/2ML IJ SOLN
4.0000 mg | Freq: Once | INTRAMUSCULAR | Status: AC
  Administered 2014-07-10: 4 mg via INTRAVENOUS
  Filled 2014-07-10: qty 2

## 2014-07-10 NOTE — Discharge Instructions (Signed)
Abdominal Pain, Women °Abdominal (stomach, pelvic, or belly) pain can be caused by many things. It is important to tell your doctor: °· The location of the pain. °· Does it come and go or is it present all the time? °· Are there things that start the pain (eating certain foods, exercise)? °· Are there other symptoms associated with the pain (fever, nausea, vomiting, diarrhea)? °All of this is helpful to know when trying to find the cause of the pain. °CAUSES  °· Stomach: virus or bacteria infection, or ulcer. °· Intestine: appendicitis (inflamed appendix), regional ileitis (Crohn's disease), ulcerative colitis (inflamed colon), irritable bowel syndrome, diverticulitis (inflamed diverticulum of the colon), or cancer of the stomach or intestine. °· Gallbladder disease or stones in the gallbladder. °· Kidney disease, kidney stones, or infection. °· Pancreas infection or cancer. °· Fibromyalgia (pain disorder). °· Diseases of the female organs: °¨ Uterus: fibroid (non-cancerous) tumors or infection. °¨ Fallopian tubes: infection or tubal pregnancy. °¨ Ovary: cysts or tumors. °¨ Pelvic adhesions (scar tissue). °¨ Endometriosis (uterus lining tissue growing in the pelvis and on the pelvic organs). °¨ Pelvic congestion syndrome (female organs filling up with blood just before the menstrual period). °¨ Pain with the menstrual period. °¨ Pain with ovulation (producing an egg). °¨ Pain with an IUD (intrauterine device, birth control) in the uterus. °¨ Cancer of the female organs. °· Functional pain (pain not caused by a disease, may improve without treatment). °· Psychological pain. °· Depression. °DIAGNOSIS  °Your doctor will decide the seriousness of your pain by doing an examination. °· Blood tests. °· X-rays. °· Ultrasound. °· CT scan (computed tomography, special type of X-ray). °· MRI (magnetic resonance imaging). °· Cultures, for infection. °· Barium enema (dye inserted in the large intestine, to better view it with  X-rays). °· Colonoscopy (looking in intestine with a lighted tube). °· Laparoscopy (minor surgery, looking in abdomen with a lighted tube). °· Major abdominal exploratory surgery (looking in abdomen with a large incision). °TREATMENT  °The treatment will depend on the cause of the pain.  °· Many cases can be observed and treated at home. °· Over-the-counter medicines recommended by your caregiver. °· Prescription medicine. °· Antibiotics, for infection. °· Birth control pills, for painful periods or for ovulation pain. °· Hormone treatment, for endometriosis. °· Nerve blocking injections. °· Physical therapy. °· Antidepressants. °· Counseling with a psychologist or psychiatrist. °· Minor or major surgery. °HOME CARE INSTRUCTIONS  °· Do not take laxatives, unless directed by your caregiver. °· Take over-the-counter pain medicine only if ordered by your caregiver. Do not take aspirin because it can cause an upset stomach or bleeding. °· Try a clear liquid diet (broth or water) as ordered by your caregiver. Slowly move to a bland diet, as tolerated, if the pain is related to the stomach or intestine. °· Have a thermometer and take your temperature several times a day, and record it. °· Bed rest and sleep, if it helps the pain. °· Avoid sexual intercourse, if it causes pain. °· Avoid stressful situations. °· Keep your follow-up appointments and tests, as your caregiver orders. °· If the pain does not go away with medicine or surgery, you may try: °¨ Acupuncture. °¨ Relaxation exercises (yoga, meditation). °¨ Group therapy. °¨ Counseling. °SEEK MEDICAL CARE IF:  °· You notice certain foods cause stomach pain. °· Your home care treatment is not helping your pain. °· You need stronger pain medicine. °· You want your IUD removed. °· You feel faint or   lightheaded. °· You develop nausea and vomiting. °· You develop a rash. °· You are having side effects or an allergy to your medicine. °SEEK IMMEDIATE MEDICAL CARE IF:  °· Your  pain does not go away or gets worse. °· You have a fever. °· Your pain is felt only in portions of the abdomen. The right side could possibly be appendicitis. The left lower portion of the abdomen could be colitis or diverticulitis. °· You are passing blood in your stools (bright red or black tarry stools, with or without vomiting). °· You have blood in your urine. °· You develop chills, with or without a fever. °· You pass out. °MAKE SURE YOU:  °· Understand these instructions. °· Will watch your condition. °· Will get help right away if you are not doing well or get worse. °Document Released: 10/27/2006 Document Revised: 05/16/2013 Document Reviewed: 11/16/2008 °[employer]® Patient Information ©2015 [employer]. This information is not intended to replace advice given to you by your health care provider. Make sure you discuss any questions you have with your health care provider. ° °

## 2014-07-10 NOTE — ED Provider Notes (Signed)
CSN: [id number]     Arrival date & time 07/09/14  2155 History   First MD Initiated Contact with Patient 07/10/14 0046     Chief Complaint  Patient presents with  . Abdominal Pain  . Emesis     (Consider location/radiation/quality/duration/timing/severity/associated sxs/prior Treatment) Patient is a 49 y.o. female presenting with abdominal pain and vomiting. The history is provided by the patient. No language interpreter was used.  Abdominal Pain Pain location:  Generalized Pain quality: aching   Pain radiates to:  Does not radiate Pain severity:  Moderate Duration:  4 days Timing:  Constant Associated symptoms: nausea and vomiting   Associated symptoms: no chest pain, no chills, no diarrhea, no fever and no shortness of breath   Associated symptoms comment:  Symptoms of abdominal pain and nausea for 4 days, vomiting on day one only. No diarrhea. No known fever. She had a breast biopsy performed last week and was concerned there was a connection between that procedure and current symptoms. No complaint of pain or redness at biopsy site. No complaint of breast pain. Emesis Associated symptoms: abdominal pain   Associated symptoms: no chills, no diarrhea and no myalgias     Past Medical History  Diagnosis Date  . Diabetes mellitus   . Hypertension    Past Surgical History  Procedure Laterality Date  . Cholecystectomy    . Laparoscopy    . Cesarean section    . Ganglion cyst excision     History reviewed. No pertinent family history. History  Substance Use Topics  . Smoking status: Former Smoker -- 0.50 packs/day    Types: Cigarettes    Quit date: 01/23/2013  . Smokeless tobacco: Never Used  . Alcohol Use: Yes     Comment: Once every 6 months   OB History    No data available     Review of Systems  Constitutional: Negative for fever and chills.  Respiratory: Negative.  Negative for shortness of breath.   Cardiovascular: Negative.  Negative for chest pain.   No breast pain.  Gastrointestinal: Positive for nausea, vomiting and abdominal pain. Negative for diarrhea.  Genitourinary: Negative.   Musculoskeletal: Negative.  Negative for myalgias.  Skin: Negative.   Neurological: Negative.       Allergies  Review of patient's allergies indicates no known allergies.  Home Medications   Prior to Admission medications   Medication Sig Start Date End Date Taking? Authorizing Provider  aspirin EC 81 MG tablet Take 81 mg by mouth every morning.   Yes Historical Provider, MD  carvedilol (COREG) 3.125 MG tablet Take 3.125 mg by mouth daily.   Yes Historical Provider, MD  insulin aspart (NOVOLOG) 100 unit/mL injection Inject 10 Units into the skin 3 (three) times daily before meals.   Yes Historical Provider, MD  insulin aspart protamine- aspart (NOVOLOG MIX 70/30) (70-30) 100 UNIT/ML injection Inject 18-20 Units into the skin 2 (two) times daily with a meal. 18 units taken in the morning 20 units taken at night   Yes Historical Provider, MD  Multiple Vitamins-Minerals (MULTIVITAMIN WITH MINERALS) tablet Take 1 tablet by mouth every morning. Patient takes Flinstones complete with Vitamin D   Yes Historical Provider, MD  ramipril (ALTACE) 2.5 MG capsule Take 2.5 mg by mouth daily.   Yes Historical Provider, MD  atorvastatin (LIPITOR) 20 MG tablet Take 1 tablet (20 mg total) by mouth daily at 6 PM. Patient not taking: Reported on 07/09/2014 02/09/13   Allie Bossier, MD  bisacodyl (EX-LAX ULTRA) 5 MG EC tablet Take 5 mg by mouth daily as needed for mild constipation or moderate constipation.    Historical Provider, MD  Diphenhydramine-APAP, sleep, (GOODY PM PO) Take 1 packet by mouth at bedtime as needed. For pain and sleep    Historical Provider, MD  Insulin Glargine (LANTUS SOLOSTAR) 100 UNIT/ML Solostar Pen Inject 15 Units into the skin daily at 10 pm. Patient not taking: Reported on 07/09/2014 02/09/13   Allie Bossier, MD  metFORMIN (GLUCOPHAGE-XR) 500 MG  24 hr tablet Take 1,000 mg by mouth 2 (two) times daily.    Historical Provider, MD   BP 176/98 mmHg  Pulse 109  Temp(Src) 98.1 F (36.7 C) (Oral)  Resp 20  Ht 5\' 2"  (1.575 m)  Wt 205 lb (92.987 kg)  BMI 37.49 kg/m2  SpO2 100%  LMP 07/09/2011 Physical Exam  Constitutional: She is oriented to person, place, and time. She appears well-developed and well-nourished.  Neck: Normal range of motion.  Pulmonary/Chest: Effort normal.  Abdominal: Soft. There is tenderness.  Diffuse abdominal tenderness to soft abdomen with positive BS throughout.   Musculoskeletal: Normal range of motion.  Neurological: She is alert and oriented to person, place, and time.  Skin: Skin is warm and dry. No erythema.  Psychiatric: She has a normal mood and affect.    ED Course  Procedures (including critical care time) Labs Review Labs Reviewed  COMPREHENSIVE METABOLIC PANEL - Abnormal; Notable for the following:    Sodium 134 (*)    Chloride 99 (*)    Glucose, Bld 272 (*)    Total Protein 8.2 (*)    All other components within normal limits  CBC WITH DIFFERENTIAL/PLATELET  LIPASE, BLOOD  URINALYSIS, ROUTINE W REFLEX MICROSCOPIC (NOT AT [facility])  I-STAT TROPOININ, ED   Results for orders placed or performed during the hospital encounter of 07/09/14  CBC with Differential  Result Value Ref Range   WBC 7.7 4.0 - 10.5 K/uL   RBC 4.88 3.87 - 5.11 MIL/uL   Hemoglobin 14.3 12.0 - 15.0 g/dL   HCT 42.5 36.0 - 46.0 %   MCV 87.1 78.0 - 100.0 fL   MCH 29.3 26.0 - 34.0 pg   MCHC 33.6 30.0 - 36.0 g/dL   RDW 12.5 11.5 - 15.5 %   Platelets PLATELET CLUMPS NOTED ON SMEAR, UNABLE TO ESTIMATE 150 - 400 K/uL   Neutrophils Relative % 56 43 - 77 %   Neutro Abs 4.3 1.7 - 7.7 K/uL   Lymphocytes Relative 37 12 - 46 %   Lymphs Abs 2.8 0.7 - 4.0 K/uL   Monocytes Relative 6 3 - 12 %   Monocytes Absolute 0.5 0.1 - 1.0 K/uL   Eosinophils Relative 1 0 - 5 %   Eosinophils Absolute 0.1 0.0 - 0.7 K/uL   Basophils Relative  0 0 - 1 %   Basophils Absolute 0.0 0.0 - 0.1 K/uL  Comprehensive metabolic panel  Result Value Ref Range   Sodium 134 (L) 135 - 145 mmol/L   Potassium 3.6 3.5 - 5.1 mmol/L   Chloride 99 (L) 101 - 111 mmol/L   CO2 25 22 - 32 mmol/L   Glucose, Bld 272 (H) 65 - 99 mg/dL   BUN 9 6 - 20 mg/dL   Creatinine, Ser 0.65 0.44 - 1.00 mg/dL   Calcium 9.2 8.9 - 10.3 mg/dL   Total Protein 8.2 (H) 6.5 - 8.1 g/dL   Albumin 4.2 3.5 - 5.0 g/dL  AST 17 15 - 41 U/L   ALT 14 14 - 54 U/L   Alkaline Phosphatase 77 38 - 126 U/L   Total Bilirubin 1.1 0.3 - 1.2 mg/dL   GFR calc non Af Amer >60 >60 mL/min   GFR calc Af Amer >60 >60 mL/min   Anion gap 10 5 - 15  Lipase, blood  Result Value Ref Range   Lipase 33 22 - 51 U/L  Urinalysis, Routine w reflex microscopic (not at [facility])  Result Value Ref Range   Color, Urine YELLOW YELLOW   APPearance CLEAR CLEAR   Specific Gravity, Urine 1.024 1.005 - 1.030   pH 6.0 5.0 - 8.0   Glucose, UA >1000 (A) NEGATIVE mg/dL   Hgb urine dipstick SMALL (A) NEGATIVE   Bilirubin Urine NEGATIVE NEGATIVE   Ketones, ur 40 (A) NEGATIVE mg/dL   Protein, ur 100 (A) NEGATIVE mg/dL   Urobilinogen, UA 1.0 0.0 - 1.0 mg/dL   Nitrite NEGATIVE NEGATIVE   Leukocytes, UA NEGATIVE NEGATIVE  Urine microscopic-add on  Result Value Ref Range   Squamous Epithelial / LPF RARE RARE   WBC, UA 0-2 <3 WBC/hpf   RBC / HPF 3-6 <3 RBC/hpf   Bacteria, UA FEW (A) RARE  I-stat troponin, ED (only if pt is 49 y.o. or older & pain is above umbilicus)  not at [facility]  Result Value Ref Range   Troponin i, poc 0.00 0.00 - 0.08 ng/mL   Comment 3            Imaging Review No results found.   EKG Interpretation None      MDM   Final diagnoses:  None    1. Abdominal pain 2. Nausea  She is feeling better with medications in ED. No vomiting. Nausea resolved. Tolerating PO fluids well.   She has a PCP available for recheck which is recommended in 24-48 hours. Return precautions discussed.      Charlann Lange, PA-C 07/10/14 0400  Varney Biles, MD 07/11/14 [phone]

## 2014-07-13 ENCOUNTER — Other Ambulatory Visit: Payer: Self-pay | Admitting: Family Medicine

## 2014-07-13 DIAGNOSIS — R1011 Right upper quadrant pain: Secondary | ICD-10-CM

## 2014-07-19 ENCOUNTER — Ambulatory Visit
Admission: RE | Admit: 2014-07-19 | Discharge: 2014-07-19 | Disposition: A | Discharge status: Home / Self Care | Payer: 59 | Source: Ambulatory Visit | Attending: Family Medicine | Admitting: Family Medicine

## 2014-07-19 DIAGNOSIS — R1011 Right upper quadrant pain: Secondary | ICD-10-CM

## 2014-07-19 MED ORDER — IOPAMIDOL (ISOVUE-300) INJECTION 61%
75.0000 mL | Freq: Once | INTRAVENOUS | Status: AC | PRN
  Administered 2014-07-19: 125 mL via INTRAVENOUS

## 2014-07-20 ENCOUNTER — Other Ambulatory Visit: Payer: 59

## 2014-08-14 ENCOUNTER — Ambulatory Visit: Payer: Self-pay | Admitting: Surgery

## 2014-08-14 DIAGNOSIS — N6320 Unspecified lump in the left breast, unspecified quadrant: Secondary | ICD-10-CM

## 2014-08-14 NOTE — H&P (Signed)
Veronica Fletcher 08/14/2014 9:50 AM Location: [facility] Patient #: [id number] DOB: Nov 05, 1965 Divorced / Language: Cleophus Molt / Race: Black or African American Female History of Present Illness Veronica Moores A. Mckinsley Koelzer MD; 08/14/2014 12:34 PM) Patient words: LT breast papilloma   pt sent at the request of Dr Joaquim Lai for left breast mass found on mammogram screening. Denies any history of pain, lump or nipple discharge. Core bx showed fibrocystic disease and possible papilloma.   Diagnosis Breast, left, needle core biopsy, UOQ - FIBROCYSTIC CHANGES WITH USUAL DUCTAL HYPERPLASIA. - HYALINIZED SCLEROTIC NODULE WITH ASSOCIATED CALCIFICATIONS. - NO ATYPIA OR MALIGNANCY IDENTIFIED. - SEE COMMENT. Microscopic Comment    ADDENDUM REPORT: 07/05/2014 13:48  ADDENDUM: Pathology reveals Left breast fibrocystic changes with usual ductal hyperplasia and hyalinized sclerotic nodule with associated calcifications. The hyalinized sclerotic nodule with associated calcifications may represent a fibroadenoma versus a sclerosed intraductal papilloma. This was found to be concordant by Dr. Everlean Alstrom. Pathology results were discussed with the patient via telephone. The patient reported tenderness at the biopsy site. Post biopsy instructions were reviewed and questions were answered. The patient was encouraged to call [facility] with any additional questions and or concerns. The patient was referred to Dr. Erroll Luna of [facility] on July 21, 2014 for a surgical consultation.  Pathology results reported by Terie Purser RN on July 05, 2014.   Electronically Signed By: Fidela Salisbury M.D. On: 07/05/2014 13:48.  The patient is a 49 year old female   Other Problems Elbert Ewings, CMA; 08/14/2014 9:51 AM) Back Pain Diabetes Mellitus Heart murmur High blood pressure Pancreatitis  Past Surgical History Elbert Ewings, CMA; 08/14/2014  9:51 AM) Breast Biopsy Left. Cesarean Section - 1 Gallbladder Surgery - Laparoscopic Hysterectomy (not due to cancer) - Partial  Diagnostic Studies History Elbert Ewings, CMA; 08/14/2014 9:51 AM) Colonoscopy never Mammogram 1-3 years ago  Allergies Elbert Ewings, CMA; 08/14/2014 9:50 AM) No Known Drug Allergies 08/14/2014  Medication History Elbert Ewings, CMA; 08/14/2014 9:53 AM) Ramipril (5MG  Capsule, Oral) Active. Ondansetron HCl (4MG  Tablet, Oral) Active. NovoLOG Mix 70/30 FlexPen ((70-30) 100UNIT/ML Susp Pen-inj, Subcutaneous) Active. Naproxen (500MG  Tablet, Oral) Active. Dicyclomine HCl (20MG  Tablet, Oral) Active. Carvedilol (3.125MG  Tablet, Oral) Active. TraMADol HCl (50MG  Tablet, Oral) Active. Hydrocodone-Acetaminophen (5-325MG  Tablet, Oral) Active. Atorvastatin Calcium (20MG  Tablet, Oral) Active. Bisacodyl (5MG  Tablet DR, Oral) Active. Medications Reconciled  Social History Elbert Ewings, [place]; 08/14/2014 9:51 AM) Alcohol use Occasional alcohol use. Caffeine use Tea. No drug use Tobacco use Current some day smoker.  Family History Elbert Ewings, [place]; 08/14/2014 9:51 AM) Cerebrovascular Accident Father. Diabetes Mellitus Brother, Mother. Hypertension Brother, Mother. Thyroid problems Daughter, Father.  Pregnancy / Birth History Elbert Ewings, CMA; 08/14/2014 9:51 AM) Age at menarche 46 years. Gravida 1 Irregular periods Maternal age 25-25 Para 1     Review of Systems Elbert Ewings CMA; 08/14/2014 9:51 AM) General Present- Appetite Loss and Fatigue. Not Present- Chills, Fever, Night Sweats, Weight Gain and Weight Loss. Skin Not Present- Change in Wart/Mole, Dryness, Hives, Jaundice, New Lesions, Non-Healing Wounds, Rash and Ulcer. HEENT Present- Seasonal Allergies and Wears glasses/contact lenses. Not Present- Earache, Hearing Loss, Hoarseness, Nose Bleed, Oral Ulcers, Ringing in the Ears, Sinus Pain, Sore Throat, Visual Disturbances and Yellow  Eyes. Respiratory Not Present- Bloody sputum, Chronic Cough, Difficulty Breathing, Snoring and Wheezing. Breast Not Present- Breast Mass, Breast Pain, Nipple Discharge and Skin Changes. Cardiovascular Not Present- Chest Pain, Difficulty Breathing Lying Down, Leg Cramps, Palpitations, Rapid Heart Rate, Shortness  of Breath and Swelling of Extremities. Gastrointestinal Present- Abdominal Pain and Constipation. Not Present- Bloating, Bloody Stool, Change in Bowel Habits, Chronic diarrhea, Difficulty Swallowing, Excessive gas, Gets full quickly at meals, Hemorrhoids, Indigestion, Nausea, Rectal Pain and Vomiting. Female Genitourinary Not Present- Frequency, Nocturia, Painful Urination, Pelvic Pain and Urgency. Musculoskeletal Present- Back Pain and Muscle Pain. Not Present- Joint Pain, Joint Stiffness, Muscle Weakness and Swelling of Extremities. Neurological Present- Headaches. Not Present- Decreased Memory, Fainting, Numbness, Seizures, Tingling, Tremor, Trouble walking and Weakness. Psychiatric Not Present- Anxiety, Bipolar, Change in Sleep Pattern, Depression, Fearful and Frequent crying. Endocrine Present- Hot flashes. Not Present- Cold Intolerance, Excessive Hunger, Hair Changes, Heat Intolerance and New Diabetes. Hematology Not Present- Easy Bruising, Excessive bleeding, Gland problems, HIV and Persistent Infections.  Vitals Elbert Ewings CMA; 08/14/2014 9:52 AM) 08/14/2014 9:51 AM Weight: 206 lb Height: 62in Body Surface Area: 2.02 m Body Mass Index: 37.68 kg/m Temp.: 98.4F(Oral)  Pulse: 86 (Regular)  BP: 138/78 (Sitting, Left Arm, Standard)     Physical Exam (Kaio Kuhlman A. Dermot Gremillion MD; 08/14/2014 12:34 PM)  General Mental Status-Alert. General Appearance-Consistent with stated age. Hydration-Well hydrated. Voice-Normal.  Head and Neck Head-normocephalic, atraumatic with no lesions or palpable masses. Trachea-midline. Thyroid Gland Characteristics - normal size  and consistency.  Eye Eyeball - Bilateral-Extraocular movements intact. Sclera/Conjunctiva - Bilateral-No scleral icterus.  Chest and Lung Exam Chest and lung exam reveals -quiet, even and easy respiratory effort with no use of accessory muscles and on auscultation, normal breath sounds, no adventitious sounds and normal vocal resonance. Inspection Chest Wall - Normal. Back - normal.  Breast Breast - Left-Symmetric, Non Tender, No Biopsy scars, no Dimpling, No Inflammation, No Lumpectomy scars, No Mastectomy scars, No Peau d' [place]. Breast - Right-Symmetric, Non Tender, No Biopsy scars, no Dimpling, No Inflammation, No Lumpectomy scars, No Mastectomy scars, No Peau d' Orange. Breast Lump-No Palpable Breast Mass.  Cardiovascular Cardiovascular examination reveals -normal heart sounds, regular rate and rhythm with no murmurs and normal pedal pulses bilaterally.  Abdomen Inspection Inspection of the abdomen reveals - No Hernias. Skin - Scar - no surgical scars. Palpation/Percussion Palpation and Percussion of the abdomen reveal - Soft, Non Tender, No Rebound tenderness, No Rigidity (guarding) and No hepatosplenomegaly. Auscultation Auscultation of the abdomen reveals - Bowel sounds normal.  Neurologic Neurologic evaluation reveals -alert and oriented x 3 with no impairment of recent or remote memory. Mental Status-Normal.  Musculoskeletal Normal Exam - Left-Upper Extremity Strength Normal and Lower Extremity Strength Normal. Normal Exam - Right-Upper Extremity Strength Normal and Lower Extremity Strength Normal.  Lymphatic Head & Neck  General Head & Neck Lymphatics: Bilateral - Description - Normal. Axillary  General Axillary Region: Bilateral - Description - Normal. Tenderness - Non Tender. Femoral & Inguinal  Generalized Femoral & Inguinal Lymphatics: Bilateral - Description - Normal. Tenderness - Non Tender.    Assessment & Plan (Trygve Thal A.  Dontavis Tschantz MD; 08/14/2014 12:35 PM)  LEFT BREAST MASS (611.72  N63) Impression: discussed options of observation vs excision and small risk of occult malignancy with papilloma pt opted for excision  Discussed seed localized left breat lumpectomy and potential complications. Risk of lumpectomy include bleeding, infection, seroma, more surgery, use of seed/wire, wound care, cosmetic deformity and the need for other treatments, death , blood clots, death. Pt agrees to proceed.  Current Plans Pt Education - [facility] Breast Biopsy HCI Pt Education - Breast Diseases: discussed with patient and provided information. Pt Education - Patient information: Common breast problems (The Basics): discussed with patient  and provided information.

## 2015-03-28 ENCOUNTER — Encounter: Payer: Self-pay | Admitting: Endocrinology

## 2015-03-28 ENCOUNTER — Ambulatory Visit (INDEPENDENT_AMBULATORY_CARE_PROVIDER_SITE_OTHER): Payer: 59 | Admitting: Endocrinology

## 2015-03-28 VITALS — BP 134/88 | HR 95 | Temp 97.8°F | Resp 16 | Ht 63.0 in | Wt 222.2 lb

## 2015-03-28 DIAGNOSIS — E1165 Type 2 diabetes mellitus with hyperglycemia: Secondary | ICD-10-CM

## 2015-03-28 DIAGNOSIS — Z794 Long term (current) use of insulin: Secondary | ICD-10-CM | POA: Diagnosis not present

## 2015-03-28 LAB — COMPREHENSIVE METABOLIC PANEL
ALBUMIN: 4.3 g/dL (ref 3.5–5.2)
ALT: 14 U/L (ref 0–35)
AST: 15 U/L (ref 0–37)
Alkaline Phosphatase: 96 U/L (ref 39–117)
BILIRUBIN TOTAL: 0.5 mg/dL (ref 0.2–1.2)
BUN: 15 mg/dL (ref 6–23)
CALCIUM: 9.5 mg/dL (ref 8.4–10.5)
CHLORIDE: 103 meq/L (ref 96–112)
CO2: 28 meq/L (ref 19–32)
CREATININE: 0.74 mg/dL (ref 0.40–1.20)
GFR: 107.02 mL/min (ref >60.00)
GLUCOSE: 156 mg/dL — AB (ref 70–99)
Potassium: 3.7 mEq/L (ref 3.5–5.1)
Sodium: 137 mEq/L (ref 135–145)
Total Protein: 7.8 g/dL (ref 6.0–8.3)

## 2015-03-28 MED ORDER — METFORMIN HCL ER 500 MG PO TB24: 2000.0000 mg | ORAL_TABLET | Freq: Every day | ORAL | Status: DC

## 2015-03-28 MED ORDER — EMPAGLIFLOZIN 25 MG PO TABS: 25.0000 mg | ORAL_TABLET | Freq: Every day | ORAL | Status: DC

## 2015-03-28 NOTE — Patient Instructions (Addendum)
Check blood sugars on waking up  2-3 times a week Also check blood sugars about 2 hours after a meal and do this after different meals by rotation  Recommended blood sugar levels on waking up is 90-130 and about 2 hours after meal is 130-160  Please bring your blood sugar monitor to each visit, thank you  Stop sweet tea  Metformin 2/day for 5 days then 4 at supper  Reduce Insulin to 14 units on Monday, same pm dose  Stop diet pills

## 2015-03-28 NOTE — Progress Notes (Signed)
Patient ID: Veronica Fletcher, female   DOB: August 29, 1965, 50 y.o.   MRN: [id number]           Reason for Appointment: Consultation for Type 2 Diabetes  Referring physician: Harrington Challenger  History of Present Illness:          Date of diagnosis of type 2 diabetes mellitus: 2005       Background history:  She had been started on metformin initially and not clear what other treatment she had taken subsequently, records are being awaited today She apparently was doing very well with a combination of Victoza and metformin until 2015 and she thinks she was losing weight with this.  She had taken Victoza for several months at least However according to his lab records her best A1c in 2014 was only 7.9   Recent history:   Apparently she was taken off Victoza when she was admitted for abdominal pain presumed to be from pancreatitis in 01/2013 She may have been given Jardiance in 2014 but she does not from her taking this at least after her hospital discharge She was then started on premixed insulin and taken off metformin also on discharge. Subsequently her blood sugars have been poorly controlled with A1c consistently around 10% including now  INSULIN regimen is: Novolog Mix insulin 18 breakfast--20  supper  Current blood sugar patterns and problems identified:    she is checking her blood sugars only when coming back from work in the afternoon and claims that these are fairly good  Her A1c appears to be consistently around 8-10%  She has had progressive weight gain with insulin, she thinks she has gained at least 40 pounds, this is with her trying to do fairly well with her diet and low fat meals  Unable to do much exercise currently because of back problems and sciatica  Non-insulin hypoglycemic drugs the patient is taking are: none      Side effects from medications have been:?  Victoza caused pancreatitis  Compliance with the medical regimen: Good Hypoglycemia:   never  Glucose monitoring:   done  times a day         Glucometer: Generic    Blood Glucose readings by  Recall:  PREMEAL Breakfast Lunch Dinner Bedtime  Overall   Glucose range: ?  130    Median:        Self-care: The diet that the patient has been following is: tries to limit portions.      Typical meal intake: Breakfast is oatmeal, Breakfast wrap, lunch is a grilled chicken, evening meal is meat and 2 vegetables, will have snacks on yogurt, peanuts and grapes               Dietician visit, most recent:2014               Exercise:Some walking at work, 15 min 3/7 days     Weight history:   Wt Readings from Last 3 Encounters:  03/28/15 222 lb 3.2 oz (100.789 kg)  07/09/14 205 lb (92.987 kg)  02/07/13 177 lb 11.2 oz (80.604 kg)    Glycemic control:   Lab Results  Component Value Date   HGBA1C 10.0 03/29/2015   HGBA1C 10.0* 02/06/2013   Lab Results  Component Value Date   LDLCALC 110* 02/07/2013   CREATININE 0.74 03/28/2015   No results found for: MICRALBCREAT        Medication List       This list is accurate as of: 03/28/15  11:59 PM.  Always use your most recent med list.               aspirin EC 81 MG tablet  Take 81 mg by mouth every morning.     atorvastatin 20 MG tablet  Commonly known as:  LIPITOR  Take 1 tablet (20 mg total) by mouth daily at 6 PM.     carvedilol 3.125 MG tablet  Commonly known as:  COREG  Take 3.125 mg by mouth daily. Reported on 03/28/2015     dicyclomine 20 MG tablet  Commonly known as:  BENTYL  Take 1 tablet (20 mg total) by mouth 2 (two) times daily.     empagliflozin 25 MG Tabs tablet  Commonly known as:  JARDIANCE  Take 25 mg by mouth daily.     EX-LAX ULTRA 5 MG EC tablet  Generic drug:  bisacodyl  Take 5 mg by mouth daily as needed for mild constipation or moderate constipation.     gabapentin 300 MG capsule  Commonly known as:  NEURONTIN  Take 300 mg by mouth at bedtime. Takes 2 tablets at bedtime     GOODY PM PO  Take 1 packet by mouth  at bedtime as needed. For pain and sleep     insulin aspart protamine- aspart (70-30) 100 UNIT/ML injection  Commonly known as:  NOVOLOG MIX 70/30  Inject 18-20 Units into the skin 2 (two) times daily with a meal. 18 units taken in the morning 20 units taken at night     meloxicam 7.5 MG tablet  Commonly known as:  MOBIC  Take 7.5 mg by mouth daily.     metFORMIN 500 MG 24 hr tablet  Commonly known as:  GLUCOPHAGE-XR  Take 4 tablets (2,000 mg total) by mouth daily with supper.     multivitamin with minerals tablet  Take 1 tablet by mouth every morning. Patient takes Flinstones complete with Vitamin D     phentermine 37.5 MG capsule  Take 37.5 mg by mouth every morning.     ramipril 2.5 MG capsule  Commonly known as:  ALTACE  Take 2.5 mg by mouth daily.     topiramate 25 MG tablet  Commonly known as:  TOPAMAX  Take 25 mg by mouth daily. Take 4 tablets at night     traMADol 50 MG tablet  Commonly known as:  ULTRAM  Take 50 mg by mouth every 12 (twelve) hours as needed.        Allergies:  Allergies  Allergen Reactions  . Liraglutide     Other reaction(s): Pancreatitis    Past Medical History  Diagnosis Date  . Diabetes mellitus   . Hypertension   . Hyperlipidemia     Past Surgical History  Procedure Laterality Date  . Cholecystectomy    . Laparoscopy    . Cesarean section    . Ganglion cyst excision      Family History  Problem Relation Age of Onset  . Diabetes Mother   . Hypertension Mother   . Diabetes Maternal Aunt   . Diabetes Paternal Aunt   . Diabetes Maternal Grandmother   . Heart disease Neg Hx     Social History:  reports that she quit smoking about 2 years ago. Her smoking use included Cigarettes. She smoked 0.50 packs per day. She has never used smokeless tobacco. She reports that she drinks alcohol. She reports that she does not use illicit drugs.    Review of Systems  Lipid history: Was previously on Lipitor, stopped as patient did  not want to take another medication    Lab Results  Component Value Date   CHOL 165 02/07/2013   HDL 35* 02/07/2013   LDLCALC 110* 02/07/2013   TRIG 102 02/07/2013   CHOLHDL 4.7 02/07/2013           Hypertension:Present for about 2 years  Most recent eye exam was 8/16  Most recent foot exam: 2/17  Review of Systems  Constitutional: Positive for weight gain. Negative for reduced appetite.  HENT: Negative for headaches.   Eyes: Negative for blurred vision.  Respiratory: Negative for shortness of breath.   Cardiovascular: Negative for palpitations and leg swelling.  Gastrointestinal: Negative for diarrhea.  Endocrine: Positive for fatigue. Negative for polydipsia.       Hot flashes  Genitourinary: Positive for nocturia.       Sometimes  Musculoskeletal: Positive for back pain.  Skin: Negative for rash.  Neurological: Negative for numbness and tingling.  Psychiatric/Behavioral: Negative for insomnia.      Physical Examination:  BP 134/88 mmHg  Pulse 95  Temp(Src) 97.8 F (36.6 C)  Resp 16  Ht 5\' 3"  (1.6 m)  Wt 222 lb 3.2 oz (100.789 kg)  BMI 39.37 kg/m2  SpO2 99%  LMP 07/09/2011  GENERAL:         Patient has generalized obesity.   HEENT:         Eye exam shows normal external appearance.  Fundus exam Is difficult to achieve because of high refraction error . Oral exam shows normal mucosa .  NECK:   There is no lymphadenopathy Thyroid is not enlarged and no nodules felt.  Carotids are normal to palpation and no bruit heard LUNGS:         Chest is symmetrical. Lungs are clear to auscultation.Marland Kitchen   HEART:         Heart sounds:  S1 and S2 are normal. No murmur or click heard., no S3 or S4.   ABDOMEN:   There is no distention present. Liver and spleen are not palpable. No other mass or tenderness present.   NEUROLOGICAL:   Ankle jerks are absent bilaterally.    Diabetic Foot Exam - Simple   Simple Foot Form  Diabetic Foot exam was performed with the following  findings:  Yes 03/28/2015  2:26 PM  Visual Inspection  No deformities, no ulcerations, no other skin breakdown bilaterally:  Yes  Sensation Testing  Intact to touch and monofilament testing bilaterally:  Yes  Pulse Check  Posterior Tibialis and Dorsalis pulse intact bilaterally:  Yes  Comments             Vibration sense is mildly reduced in distal first toes. MUSCULOSKELETAL:  There is no swelling or deformity of the peripheral joints.  EXTREMITIES:     There is no edema. No skin lesions present.Marland Kitchen SKIN:      No acanthosis seen   No rash or lesions of concern.        ASSESSMENT:  Diabetes type 2, uncontrolled See history of present illness for detailed discussion of current diabetes management, blood sugar patterns and problems identified     Although she apparently had excellent control with taking Victoza previously and had lost weight she has not been taking this because of presumed episode of pancreatitis from it in 2015 Currently only on premixed insulin twice a day With insulin alone she has gained significant amount of weight and continues to  have poor control with A1c consistently around 10% Also unable to assess her blood sugar patterns as she is mostly checking blood sugars in the afternoon and claimed that these are mostly normal with using her store brand glucose monitor, does not correlate with her A1c of [id number] today  Complications: None evident  OBESITY with BMI nearly 40. Although she is currently taking phentermine and Topamax she appears to be getting increased blood pressure and tachycardia from this and this does not appear to be a long-term option for treatment  PLAN:     Start METFORMIN ER 2000 mg daily, she can start with 1000 mg for the first 5 days  JARDIANCE 25 mg daily in the morning.  This should help her insulin sensitivity and promote weight loss  Reduce at least morning insulin by 4 units weekly  She will check her blood pressure at least every other  day and stop ramipril if blood pressure less than [id number] systolic  Stop phentermine since she is having increased blood pressure and tachycardia with this  Use Accu-Chek monitor and check blood sugars regularly either fasting or 2 hours after any 1 meal, discussed blood sugar targets  Increase exercise as tolerated  Consistently improve diet especially with sweet drinks  Follow-up in 6 weeks  Patient Instructions  Check blood sugars on waking up  2-3 times a week Also check blood sugars about 2 hours after a meal and do this after different meals by rotation  Recommended blood sugar levels on waking up is 90-130 and about 2 hours after meal is 130-160  Please bring your blood sugar monitor to each visit, thank you  Stop sweet tea  Metformin 2/day for 5 days then 4 at supper  Reduce Insulin to 14 units on Monday, same pm dose  Stop diet pills      Ibraheem Voris 03/29/2015, 12:03 PM   Note: This office note was prepared with Estate agent. Any transcriptional errors that result from this process are unintentional.

## 2015-03-29 LAB — POCT GLYCOSYLATED HEMOGLOBIN (HGB A1C): HEMOGLOBIN A1C: 10

## 2015-03-29 LAB — FRUCTOSAMINE: FRUCTOSAMINE: 358 umol/L — AB (ref 0–285)

## 2015-04-18 ENCOUNTER — Encounter: Payer: Self-pay | Admitting: Endocrinology

## 2015-04-18 ENCOUNTER — Ambulatory Visit (INDEPENDENT_AMBULATORY_CARE_PROVIDER_SITE_OTHER): Payer: 59 | Admitting: Endocrinology

## 2015-04-18 VITALS — BP 132/84 | HR 94 | Temp 98.4°F | Resp 14 | Ht 63.0 in | Wt 223.8 lb

## 2015-04-18 DIAGNOSIS — Z794 Long term (current) use of insulin: Secondary | ICD-10-CM

## 2015-04-18 DIAGNOSIS — E1165 Type 2 diabetes mellitus with hyperglycemia: Secondary | ICD-10-CM | POA: Diagnosis not present

## 2015-04-18 MED ORDER — ATORVASTATIN CALCIUM 10 MG PO TABS: 10.0000 mg | ORAL_TABLET | Freq: Every day | ORAL | Status: DC

## 2015-04-18 NOTE — Progress Notes (Signed)
Patient ID: Veronica Fletcher, female   DOB: [birth year], 50 y.o.   MRN: [id number]           Reason for Appointment: Follow-up for Type 2 Diabetes  Referring physician: Harrington Challenger  History of Present Illness:          Date of diagnosis of type 2 diabetes mellitus: 2005       Background history:  She had been started on metformin initially and not clear what other treatment she had taken subsequently, records are being awaited today She apparently was doing very well with a combination of Victoza and metformin until 2015 and she thinks she was losing weight with this.  She had taken Victoza for several months at least However according to his lab records her best A1c in 2014 was only 7.9 Apparently she was taken off Victoza when she was admitted for abdominal pain presumed to be from pancreatitis in 01/2013 She was then started on premixed insulin and taken off metformin also on discharge.  Recent history:   She was referred here for poorly controlled diabetes with A1c consistently around 10% On her initial consultation she was given metformin and Jardiance in addition to her basal insulin  INSULIN regimen is: Novolog Mix insulin 14 breakfast--20 supper Non-insulin hyperglycemic drugs: Jardiance 25 mg daily, metformin ER 2000 mg daily  Current management, blood sugar patterns and problems identified:    She has started checking blood sugars with the brand name monitor now but relatively infrequently  Her morning insulin was reduced by 4 units  Her blood sugars are looking fairly good overall except one high reading after breakfast  She has a couple of recent fasting readings which are fairly good  Blood sugars in the afternoon or quite good also but she has only one reading after supper  No side effects from her Jardiance  Has taken metformin without difficulty and now taking 2 tablets at lunch and supper   She was told to leave off the sweet tea and she is doing this  Unable to do  much exercise currently because of back problems and sciatica  Non-insulin hypoglycemic drugs the patient is taking are: none      Side effects from medications have been:?  Victoza caused pancreatitis  Compliance with the medical regimen: Good Hypoglycemia:   only on 3/23 morning, had not eaten her evening meal the night before  Glucose monitoring:  done 1-2 times a day         Glucometer:   Contour   Blood Glucose readings by  download  Mean values apply above for all meters except median for One Touch  PRE-MEAL Fasting Before lunch  Dinner Bedtime Overall  Glucose range: 56-133 168-230  74-120  98    Mean/median:     123   :Self-care: The diet that the patient has been following is: tries to limit portions.      Typical meal intake: Breakfast is oatmeal, Breakfast wrap, lunch is a grilled chicken, evening meal is meat and 2 vegetables, will have snacks on yogurt, peanuts and grapes                Dietician visit, most recent:2014               Exercise:Some walking at work, 15 min 3/7 days     Weight history:   Wt Readings from Last 3 Encounters:  04/18/15 223 lb 12.8 oz (101.515 kg)  03/28/15 222 lb 3.2 oz (100.789 kg)  07/09/14 205 lb (92.987 kg)    Glycemic control:   Lab Results  Component Value Date   HGBA1C 10.0 03/29/2015   HGBA1C 10.0* 02/06/2013   Lab Results  Component Value Date   LDLCALC 110* 02/07/2013   CREATININE 0.74 03/28/2015   No results found for: MICRALBCREAT        Medication List       This list is accurate as of: 04/18/15  5:14 PM.  Always use your most recent med list.               aspirin EC 81 MG tablet  Take 81 mg by mouth every morning.     atorvastatin 10 MG tablet  Commonly known as:  LIPITOR  Take 1 tablet (10 mg total) by mouth daily at 6 PM.     carvedilol 3.125 MG tablet  Commonly known as:  COREG  Take 3.125 mg by mouth daily. Reported on 03/28/2015     dicyclomine 20 MG tablet  Commonly known as:  BENTYL    Take 1 tablet (20 mg total) by mouth 2 (two) times daily.     empagliflozin 25 MG Tabs tablet  Commonly known as:  JARDIANCE  Take 25 mg by mouth daily.     EX-LAX ULTRA 5 MG EC tablet  Generic drug:  bisacodyl  Take 5 mg by mouth daily as needed for mild constipation or moderate constipation.     gabapentin 300 MG capsule  Commonly known as:  NEURONTIN  Take 300 mg by mouth at bedtime. Takes 2 tablets at bedtime     GOODY PM PO  Take 1 packet by mouth at bedtime as needed. For pain and sleep     insulin aspart protamine- aspart (70-30) 100 UNIT/ML injection  Commonly known as:  NOVOLOG MIX 70/30  Inject 18-20 Units into the skin 2 (two) times daily with a meal. 18 units taken in the morning 20 units taken at night     meloxicam 7.5 MG tablet  Commonly known as:  MOBIC  Take 7.5 mg by mouth daily.     metFORMIN 500 MG 24 hr tablet  Commonly known as:  GLUCOPHAGE-XR  Take 4 tablets (2,000 mg total) by mouth daily with supper.     multivitamin with minerals tablet  Take 1 tablet by mouth every morning. Patient takes Flinstones complete with Vitamin D     ramipril 2.5 MG capsule  Commonly known as:  ALTACE  Take 2.5 mg by mouth daily.     topiramate 25 MG tablet  Commonly known as:  TOPAMAX  Take 25 mg by mouth daily. Reported on 04/18/2015     traMADol 50 MG tablet  Commonly known as:  ULTRAM  Take 50 mg by mouth every 12 (twelve) hours as needed.        Allergies:  Allergies  Allergen Reactions  . Liraglutide     Other reaction(s): Pancreatitis    Past Medical History  Diagnosis Date  . Diabetes mellitus   . Hypertension   . Hyperlipidemia     Past Surgical History  Procedure Laterality Date  . Cholecystectomy    . Laparoscopy    . Cesarean section    . Ganglion cyst excision      Family History  Problem Relation Age of Onset  . Diabetes Mother   . Hypertension Mother   . Diabetes Maternal Aunt   . Diabetes Paternal Aunt   . Diabetes Maternal  Grandmother   .  Heart disease Neg Hx     Social History:  reports that she quit smoking about 2 years ago. Her smoking use included Cigarettes. She smoked 0.50 packs per day. She has never used smokeless tobacco. She reports that she drinks alcohol. She reports that she does not use illicit drugs.    Review of Systems    Lipid history: Was previously on Lipitor, stopped as patient did not want to take another medication Her last LDL was 129 in 9/16    Lab Results  Component Value Date   CHOL 165 02/07/2013   HDL 35* 02/07/2013   LDLCALC 110* 02/07/2013   TRIG 102 02/07/2013   CHOLHDL 4.7 02/07/2013           Hypertension:Present for about 2 years  Most recent eye exam was 8/16  Most recent foot exam: 2/17  Review of Systems    Physical Examination:  BP 132/84 mmHg  Pulse 94  Temp(Src) 98.4 F (36.9 C) (Oral)  Resp 14  Ht 5\' 3"  (1.6 m)  Wt 223 lb 12.8 oz (101.515 kg)  BMI 39.65 kg/m2  SpO2 98%  LMP 07/09/2011   ASSESSMENT:  Diabetes type 2, uncontrolled See history of present illness for detailed discussion of current diabetes management, blood sugar patterns and problems identified     She appears to have excellent control now with adding Jardiance and metformin to her regimen No side effects with this Her weight is about the same despite improved blood sugars  OBESITY with BMI nearly 40. She does not want to see the dietitian She thinks she can plan her meals on her own Currently not able to exercise Not a candidate for Victoza probably, because of history of pancreatitis  HYPERTENSION: Fair control  HYPERLIPIDEMIA: Needs to be back on Lipitor and will send prescription for 10 mg  PLAN:     No change in insulin as yet  Discussed possibly reducing insulin doses in the morning or evening based on her blood sugar patterns if they are relatively lower  Follow-up BMP today  Follow-up in 6 weeks  There are no Patient Instructions on file for this  visit.  Veronica Fletcher 04/18/2015, 5:14 PM   Note: This office note was prepared with Dragon voice recognition system technology. Any transcriptional errors that result from this process are unintentional.

## 2015-04-19 LAB — BASIC METABOLIC PANEL
BUN: 17 mg/dL (ref 6–23)
CO2: 25 meq/L (ref 19–32)
Calcium: 9.4 mg/dL (ref 8.4–10.5)
Chloride: 103 mEq/L (ref 96–112)
Creatinine, Ser: 0.77 mg/dL (ref 0.40–1.20)
GFR: 102.2 mL/min (ref >60.00)
GLUCOSE: 186 mg/dL — AB (ref 70–99)
Potassium: 4 mEq/L (ref 3.5–5.1)
Sodium: 136 mEq/L (ref 135–145)

## 2015-05-22 ENCOUNTER — Other Ambulatory Visit: Payer: Self-pay | Admitting: *Deleted

## 2015-05-22 DIAGNOSIS — Z794 Long term (current) use of insulin: Secondary | ICD-10-CM

## 2015-05-22 DIAGNOSIS — E1165 Type 2 diabetes mellitus with hyperglycemia: Secondary | ICD-10-CM

## 2015-05-22 MED ORDER — EMPAGLIFLOZIN 25 MG PO TABS: 25.0000 mg | ORAL_TABLET | Freq: Every day | ORAL | Status: DC

## 2015-05-26 ENCOUNTER — Other Ambulatory Visit: Payer: Self-pay | Admitting: Endocrinology

## 2015-05-30 ENCOUNTER — Other Ambulatory Visit: Payer: Self-pay | Admitting: *Deleted

## 2015-05-30 ENCOUNTER — Ambulatory Visit (INDEPENDENT_AMBULATORY_CARE_PROVIDER_SITE_OTHER): Payer: 59 | Admitting: Endocrinology

## 2015-05-30 ENCOUNTER — Encounter: Payer: Self-pay | Admitting: Endocrinology

## 2015-05-30 ENCOUNTER — Other Ambulatory Visit: Payer: Self-pay | Admitting: Orthopedic Surgery

## 2015-05-30 VITALS — BP 158/88 | HR 94 | Temp 97.7°F | Resp 16 | Ht 63.0 in | Wt 219.4 lb

## 2015-05-30 DIAGNOSIS — Z794 Long term (current) use of insulin: Secondary | ICD-10-CM

## 2015-05-30 DIAGNOSIS — E1165 Type 2 diabetes mellitus with hyperglycemia: Secondary | ICD-10-CM | POA: Diagnosis not present

## 2015-05-30 DIAGNOSIS — G8929 Other chronic pain: Secondary | ICD-10-CM

## 2015-05-30 DIAGNOSIS — M545 Low back pain: Principal | ICD-10-CM

## 2015-05-30 MED ORDER — METFORMIN HCL ER 500 MG PO TB24: 2000.0000 mg | ORAL_TABLET | Freq: Every day | ORAL | Status: DC

## 2015-05-30 NOTE — Patient Instructions (Addendum)
18 units at supper  Add a protein with every meal  If not able to eat much reduce am dose to 14  Check home BP

## 2015-05-30 NOTE — Progress Notes (Signed)
Patient ID: Veronica Fletcher, female   DOB: 08-19-1965, 50 y.o.   MRN: [id number]           Reason for Appointment: Follow-up for Type 2 Diabetes  Referring physician: Harrington Challenger  History of Present Illness:          Date of diagnosis of type 2 diabetes mellitus: 2005       Background history:  She had been started on metformin initially and not clear what other treatment she had taken subsequently, records are being awaited today She apparently was doing very well with a combination of Victoza and metformin until 2015 and she thinks she was losing weight with this.  She had taken Victoza for several months at least However according to his lab records her best A1c in 2014 was only 7.9 Apparently she was taken off Victoza when she was admitted for abdominal pain presumed to be from pancreatitis in 01/2013 She was then started on premixed insulin and taken off metformin also on discharge.  Recent history:   She was referred here for poorly controlled diabetes with A1c consistently around 10% On her initial consultation she was given metformin and Jardiance in addition to her insulin  INSULIN regimen is: Novolog Mix insulin 18 breakfast--20 supper  Non-insulin hyperglycemic drugs: Jardiance 25 mg daily, metformin ER 2000 mg daily  Current management, blood sugar patterns and problems identified:  She is here for short-term follow-up, was having variable blood sugars on the last visit  Her morning insulin was increased again when she was reporting high readings during the day  Her blood sugars are looking fairly good overall except one high reading before dinner  During the day occasionally she will feel her blood sugar getting lower if she is not eating as much especially when she is needing to rest with her back pain  She has had a low normal blood sugar in the morning also but none overnight  Overall checking blood sugars somewhat infrequently   She will leave off the sweet tea now      Unable to do much exercise currently because of back problems and sciatica  Non-insulin hypoglycemic drugs the patient is taking are: none      Side effects from medications have been:?  Victoza caused pancreatitis  Compliance with the medical regimen: Good Hypoglycemia:   only on 3/23 morning, had not eaten her evening meal the night before  Glucose monitoring:  done 1-2 times a day         Glucometer:   Contour   Blood Glucose readings by  Download   Mean values apply above for all meters except median for One Touch  PRE-MEAL Fasting Lunch Dinner Bedtime Overall  Glucose range: 63, 148  91, 147  68, 223  90-204    Mean/median:    130  127     :Self-care: The diet that the patient has been following is: tries to limit portions.      Typical meal intake: Breakfast is oatmeal, Breakfast wrap, lunch is a grilled chicken, evening meal is meat and 2 vegetables, will have snacks on yogurt, peanuts and grapes      Dinner 8 pm           Dietician visit, most recent:2014               Exercise:Some walking at work, 15 min 3/7 days     Weight history:   Wt Readings from Last 3 Encounters:  05/30/15 219 lb  6.4 oz (99.519 kg)  04/18/15 223 lb 12.8 oz (101.515 kg)  03/28/15 222 lb 3.2 oz (100.789 kg)    Glycemic control:   Lab Results  Component Value Date   HGBA1C 10.0 03/29/2015   HGBA1C 10.0* 02/06/2013   Lab Results  Component Value Date   LDLCALC 110* 02/07/2013   CREATININE 0.77 04/18/2015   No results found for: MICRALBCREAT        Medication List       This list is accurate as of: 05/30/15  9:33 PM.  Always use your most recent med list.               aspirin EC 81 MG tablet  Take 81 mg by mouth every morning.     atorvastatin 10 MG tablet  Commonly known as:  LIPITOR  Take 1 tablet (10 mg total) by mouth daily at 6 PM.     carvedilol 3.125 MG tablet  Commonly known as:  COREG  Take 3.125 mg by mouth daily. Reported on 03/28/2015     dicyclomine  20 MG tablet  Commonly known as:  BENTYL  Take 1 tablet (20 mg total) by mouth 2 (two) times daily.     empagliflozin 25 MG Tabs tablet  Commonly known as:  JARDIANCE  Take 25 mg by mouth daily.     EX-LAX ULTRA 5 MG EC tablet  Generic drug:  bisacodyl  Take 5 mg by mouth daily as needed for mild constipation or moderate constipation.     gabapentin 300 MG capsule  Commonly known as:  NEURONTIN  Take 300 mg by mouth at bedtime. Takes 2 tablets at bedtime     GOODY PM PO  Take 1 packet by mouth at bedtime as needed. For pain and sleep     insulin aspart protamine- aspart (70-30) 100 UNIT/ML injection  Commonly known as:  NOVOLOG MIX 70/30  Inject 18-20 Units into the skin 2 (two) times daily with a meal. 18 units taken in the morning 20 units taken at night     meloxicam 7.5 MG tablet  Commonly known as:  MOBIC  Take 7.5 mg by mouth daily.     metFORMIN 500 MG 24 hr tablet  Commonly known as:  GLUCOPHAGE-XR  Take 4 tablets (2,000 mg total) by mouth daily with supper.     multivitamin with minerals tablet  Take 1 tablet by mouth every morning. Patient takes Flinstones complete with Vitamin D     ramipril 2.5 MG capsule  Commonly known as:  ALTACE  Take 2.5 mg by mouth daily.     traMADol 50 MG tablet  Commonly known as:  ULTRAM  Take 50 mg by mouth every 12 (twelve) hours as needed.        Allergies:  Allergies  Allergen Reactions  . Liraglutide     Other reaction(s): Pancreatitis    Past Medical History  Diagnosis Date  . Diabetes mellitus   . Hypertension   . Hyperlipidemia     Past Surgical History  Procedure Laterality Date  . Cholecystectomy    . Laparoscopy    . Cesarean section    . Ganglion cyst excision      Family History  Problem Relation Age of Onset  . Diabetes Mother   . Hypertension Mother   . Diabetes Maternal Aunt   . Diabetes Paternal Aunt   . Diabetes Maternal Grandmother   . Heart disease Neg Hx     Social History:  reports that she quit smoking about 2 years ago. Her smoking use included Cigarettes. She smoked 0.50 packs per day. She has never used smokeless tobacco. She reports that she drinks alcohol. She reports that she does not use illicit drugs.    Review of Systems    Lipid history: Was previously on Lipitor, stopped as patient did not want to take another medication Her last LDL was 129 in 9/16, Followed by PCP    Lab Results  Component Value Date   CHOL 165 02/07/2013   HDL 35* 02/07/2013   LDLCALC 110* 02/07/2013   TRIG 102 02/07/2013   CHOLHDL 4.7 02/07/2013           Hypertension: Present for about 2 years, sees PCP and she thinks her blood pressure was much better this morning with him  BP Readings from Last 3 Encounters:  05/30/15 158/88  04/18/15 132/84  03/28/15 134/88    Most recent eye exam was 8/16  Most recent foot exam: 2/17  Review of Systems    Physical Examination:  BP 158/88 mmHg  Pulse 94  Temp(Src) 97.7 F (36.5 C)  Resp 16  Ht 5\' 3"  (1.6 m)  Wt 219 lb 6.4 oz (99.519 kg)  BMI 38.87 kg/m2  SpO2 98%  LMP 07/09/2011   ASSESSMENT:  Diabetes type 2, uncontrolled See history of present illness for detailed discussion of current diabetes management, blood sugar patterns and problems identified     She appears to have Overall good control now with premixed insulin, Jardiance and metformin As sporadic high readings and her postprandial readings are not high in the evening usually Occasional blood sugars in the 60s are either related to decreased intake and occasionally in the mornings  HYPERTENSION: Fair control, probably higher because of her pain.  She will try to check blood pressure at home  HYPERLIPIDEMIA: Needs to have lipids rechecked  PLAN:     Since she has occasional  low sugars waking up will reduce her evening dose by 2 units  More frequent glucose monitoring neededNo change in insulin as yet  She will call if she has any  significant change with her glucose  Patient Instructions  18 units at supper  Add a protein with every meal  If not able to eat much reduce am dose to 14  Check home BP    Della Scrivener 05/30/2015, 9:33 PM   Note: This office note was prepared with Dragon voice recognition system technology. Any transcriptional errors that result from this process are unintentional.

## 2015-06-09 ENCOUNTER — Ambulatory Visit
Admission: RE | Admit: 2015-06-09 | Discharge: 2015-06-09 | Disposition: A | Discharge status: Home / Self Care | Payer: 59 | Source: Ambulatory Visit | Attending: Orthopedic Surgery | Admitting: Orthopedic Surgery

## 2015-06-09 DIAGNOSIS — G8929 Other chronic pain: Secondary | ICD-10-CM

## 2015-06-09 DIAGNOSIS — M545 Low back pain: Principal | ICD-10-CM

## 2015-08-27 ENCOUNTER — Other Ambulatory Visit (INDEPENDENT_AMBULATORY_CARE_PROVIDER_SITE_OTHER): Payer: 59

## 2015-08-27 DIAGNOSIS — E1165 Type 2 diabetes mellitus with hyperglycemia: Secondary | ICD-10-CM | POA: Diagnosis not present

## 2015-08-27 DIAGNOSIS — R7989 Other specified abnormal findings of blood chemistry: Secondary | ICD-10-CM

## 2015-08-27 DIAGNOSIS — Z794 Long term (current) use of insulin: Secondary | ICD-10-CM | POA: Diagnosis not present

## 2015-08-27 LAB — COMPREHENSIVE METABOLIC PANEL
ALK PHOS: 82 U/L (ref 39–117)
ALT: 15 U/L (ref 0–35)
AST: 16 U/L (ref 0–37)
Albumin: 4.5 g/dL (ref 3.5–5.2)
BILIRUBIN TOTAL: 0.3 mg/dL (ref 0.2–1.2)
BUN: 13 mg/dL (ref 6–23)
CO2: 27 mEq/L (ref 19–32)
Calcium: 9.7 mg/dL (ref 8.4–10.5)
Chloride: 105 mEq/L (ref 96–112)
Creatinine, Ser: 0.9 mg/dL (ref 0.40–1.20)
GFR: 85.24 mL/min (ref >60.00)
GLUCOSE: 155 mg/dL — AB (ref 70–99)
POTASSIUM: 4 meq/L (ref 3.5–5.1)
Sodium: 141 mEq/L (ref 135–145)
TOTAL PROTEIN: 8 g/dL (ref 6.0–8.3)

## 2015-08-27 LAB — LIPID PANEL
Cholesterol: 192 mg/dL (ref 0–200)
HDL: 44.5 mg/dL (ref >39.00)
NONHDL: 147.57
Total CHOL/HDL Ratio: 4
Triglycerides: 202 mg/dL — ABNORMAL HIGH (ref 0.0–149.0)
VLDL: 40.4 mg/dL — AB (ref 0.0–40.0)

## 2015-08-27 LAB — HEMOGLOBIN A1C: Hgb A1c MFr Bld: 7.8 % — ABNORMAL HIGH (ref 4.6–6.5)

## 2015-08-27 LAB — LDL CHOLESTEROL, DIRECT: LDL DIRECT: 101 mg/dL

## 2015-08-30 ENCOUNTER — Ambulatory Visit (INDEPENDENT_AMBULATORY_CARE_PROVIDER_SITE_OTHER): Payer: 59 | Admitting: Endocrinology

## 2015-08-30 ENCOUNTER — Encounter: Payer: Self-pay | Admitting: Endocrinology

## 2015-08-30 VITALS — BP 160/82 | HR 102 | Ht 63.0 in | Wt 215.0 lb

## 2015-08-30 DIAGNOSIS — E1165 Type 2 diabetes mellitus with hyperglycemia: Secondary | ICD-10-CM

## 2015-08-30 DIAGNOSIS — Z794 Long term (current) use of insulin: Secondary | ICD-10-CM | POA: Diagnosis not present

## 2015-08-30 DIAGNOSIS — I10 Essential (primary) hypertension: Secondary | ICD-10-CM | POA: Diagnosis not present

## 2015-08-30 NOTE — Patient Instructions (Signed)
Check blood sugars on waking up  Every 2 days  Also check blood sugars about 2 hours after a meal and do this after different meals by rotation  Recommended blood sugar levels on waking up is 90-130 and about 2 hours after meal is 130-160  Please bring your blood sugar monitor to each visit, thank you  Take pm insulin 15 min before supper  Take 20 units in am  Take 2 ramipril daily

## 2015-08-30 NOTE — Progress Notes (Signed)
Patient ID: Veronica Fletcher, female   DOB: [birth year], 50 y.o.   MRN: [id number]           Reason for Appointment: Follow-up for Type 2 Diabetes  Referring physician: Harrington Challenger  History of Present Illness:          Date of diagnosis of type 2 diabetes mellitus: 2005       Background history:  She had been started on metformin initially and not clear what other treatment she had taken subsequently, records are being awaited today She apparently was doing very well with a combination of Victoza and metformin until 2015 and she thinks she was losing weight with this.  She had taken Victoza for several months at least However according to his lab records her best A1c in 2014 was only 7.9 Apparently she was taken off Victoza when she was admitted for abdominal pain presumed to be from pancreatitis in 01/2013 She was then started on premixed insulin and taken off metformin also on discharge.  Recent history:   She was referred here for poorly controlled diabetes with A1c consistently around 10% On her initial consultation she was given metformin and Jardiance in addition to her insulin   INSULIN regimen is: Novolog Mix insulin 18 breakfast--20 ac supper  Non-insulin hyperglycemic drugs: Jardiance 25 mg daily, metformin ER 2000 mg daily  Current management, blood sugar patterns and problems identified:  Although her blood sugars are relatively better she still has a high A1c of 7.8  She now thinks that blood sugars are high before dinner and at bedtime even though last time glucose readings were better in the afternoon and having occasional hypoglycemia also  She was supposed to reduce her evening dose but she is still taking 20 units  Not clear why she takes her evening insulin and hour before evening meal   She did not bring her monitor for download  Her morning blood sugars are relatively better  She does not check at lunchtime but her postprandial reading in the morning was 155  She  has only mild rare low sugars if she is not eating much at lunchtime  Unable to do much exercise currently because of back problems but is trying to walk more than before Has lost a little weight  Non-insulin hypoglycemic drugs the patient is taking are: none      Side effects from medications have been:?  Victoza caused pancreatitis  Compliance with the medical regimen: Good Hypoglycemia:    Glucose monitoring:  done 1-2 times a day         Glucometer:   Contour   Blood Glucose readings by recall   Mean values apply above for all meters except median for One Touch  PRE-MEAL Fasting Lunch Dinner Bedtime Overall  Glucose range: 90s ? 160-180 150-180    Mean/median:         :Self-care: The diet that the patient has been following is: tries to limit portions.      Typical meal intake: Breakfast is oatmeal, Breakfast wrap, lunch is a grilled chicken, evening meal is meat and 2 vegetables, will have snacks on yogurt, peanuts and grapes      Dinner 8 pm           Dietician visit, most recent:2014               Exercise:Some walking at work, 15-30 min 3-5/7 days     Weight history:   Wt Readings from Last 3 Encounters:  08/30/15 215 lb (97.5 kg)  05/30/15 219 lb 6.4 oz (99.5 kg)  04/18/15 223 lb 12.8 oz (101.5 kg)    Glycemic control:   Lab Results  Component Value Date   HGBA1C 7.8 (H) 08/27/2015   HGBA1C 10.0 03/29/2015   HGBA1C 10.0 (H) 02/06/2013   Lab Results  Component Value Date   LDLCALC 110 (H) 02/07/2013   CREATININE 0.90 08/27/2015   No results found for: MICRALBCREAT   Lab on 08/27/2015  Component Date Value Ref Range Status  . Sodium 08/27/2015 141  135 - 145 mEq/L Final  . Potassium 08/27/2015 4.0  3.5 - 5.1 mEq/L Final  . Chloride 08/27/2015 105  96 - 112 mEq/L Final  . CO2 08/27/2015 27  19 - 32 mEq/L Final  . Glucose, Bld 08/27/2015 155* 70 - 99 mg/dL Final  . BUN 08/27/2015 13  6 - 23 mg/dL Final  . Creatinine, Ser 08/27/2015 0.90  0.40 - 1.20  mg/dL Final  . Total Bilirubin 08/27/2015 0.3  0.2 - 1.2 mg/dL Final  . Alkaline Phosphatase 08/27/2015 82  39 - 117 U/L Final  . AST 08/27/2015 16  0 - 37 U/L Final  . ALT 08/27/2015 15  0 - 35 U/L Final  . Total Protein 08/27/2015 8.0  6.0 - 8.3 g/dL Final  . Albumin 08/27/2015 4.5  3.5 - 5.2 g/dL Final  . Calcium 08/27/2015 9.7  8.4 - 10.5 mg/dL Final  . GFR 08/27/2015 85.24  >60.00 mL/min Final  . Cholesterol 08/27/2015 192  0 - 200 mg/dL Final  . Triglycerides 08/27/2015 202.0* 0.0 - 149.0 mg/dL Final  . HDL 08/27/2015 44.50  >39.00 mg/dL Final  . VLDL 08/27/2015 40.4* 0.0 - 40.0 mg/dL Final  . Total CHOL/HDL Ratio 08/27/2015 4   Final  . NonHDL 08/27/2015 147.57   Final  . Hgb A1c MFr Bld 08/27/2015 7.8* 4.6 - 6.5 % Final  . Direct LDL 08/27/2015 101.0  mg/dL Final        Medication List       Accurate as of 08/30/15  8:56 AM. Always use your most recent med list.          acetaminophen-codeine 300-30 MG tablet Commonly known as:  TYLENOL #3   aspirin EC 81 MG tablet Take 81 mg by mouth every morning.   atorvastatin 10 MG tablet Commonly known as:  LIPITOR Take 1 tablet (10 mg total) by mouth daily at 6 PM.   carvedilol 3.125 MG tablet Commonly known as:  COREG Take 3.125 mg by mouth daily. Reported on 03/28/2015   dicyclomine 20 MG tablet Commonly known as:  BENTYL Take 1 tablet (20 mg total) by mouth 2 (two) times daily.   empagliflozin 25 MG Tabs tablet Commonly known as:  JARDIANCE Take 25 mg by mouth daily.   EX-LAX ULTRA 5 MG EC tablet Generic drug:  bisacodyl Take 5 mg by mouth daily as needed for mild constipation or moderate constipation.   gabapentin 300 MG capsule Commonly known as:  NEURONTIN Take 300 mg by mouth at bedtime. Takes 2 tablets at bedtime   GOODY PM PO Take 1 packet by mouth at bedtime as needed. For pain and sleep   insulin aspart protamine- aspart (70-30) 100 UNIT/ML injection Commonly known as:  NOVOLOG MIX 70/30 Inject  18-20 Units into the skin 2 (two) times daily with a meal. 18 units taken in the morning 20 units taken at night   meloxicam 7.5 MG tablet Commonly known  as:  MOBIC Take 7.5 mg by mouth daily.   metFORMIN 500 MG 24 hr tablet Commonly known as:  GLUCOPHAGE-XR Take 4 tablets (2,000 mg total) by mouth daily with supper.   multivitamin with minerals tablet Take 1 tablet by mouth every morning. Patient takes Flinstones complete with Vitamin D   ramipril 2.5 MG capsule Commonly known as:  ALTACE Take 2.5 mg by mouth daily.   traMADol 50 MG tablet Commonly known as:  ULTRAM Take 50 mg by mouth every 12 (twelve) hours as needed.       Allergies:  Allergies  Allergen Reactions  . Liraglutide     Other reaction(s): Pancreatitis    Past Medical History:  Diagnosis Date  . Diabetes mellitus   . Hyperlipidemia   . Hypertension     Past Surgical History:  Procedure Laterality Date  . CESAREAN SECTION    . CHOLECYSTECTOMY    . GANGLION CYST EXCISION    . LAPAROSCOPY      Family History  Problem Relation Age of Onset  . Diabetes Mother   . Hypertension Mother   . Diabetes Maternal Aunt   . Diabetes Paternal Aunt   . Diabetes Maternal Grandmother   . Heart disease Neg Hx     Social History:  reports that she quit smoking about 2 years ago. Her smoking use included Cigarettes. She smoked 0.50 packs per day. She has never used smokeless tobacco. She reports that she drinks alcohol. She reports that she does not use drugs.    Review of Systems    Lipid history:  on Lipitor 10 mg    Lab Results  Component Value Date   CHOL 192 08/27/2015   HDL 44.50 08/27/2015   LDLCALC 110 (H) 02/07/2013   LDLDIRECT 101.0 08/27/2015   TRIG 202.0 (H) 08/27/2015   CHOLHDL 4 08/27/2015           Hypertension: Present for about 2 years, sees PCP and  Appears to be only on small doses of medications    BP Readings from Last 3 Encounters:  08/30/15 (!) 160/82  05/30/15 (!) 158/88    04/18/15 132/84    Most recent eye exam was 8/16  Most recent foot exam: 2/17  Review of Systems    Physical Examination:  BP (!) 160/82 (BP Location: Left Arm)   Pulse (!) 102   Ht 5\' 3"  (1.6 m)   Wt 215 lb (97.5 kg)   LMP 07/09/2011   BMI 38.09 kg/m    ASSESSMENT:  Diabetes type 2, uncontrolled See history of present illness for detailed discussion of current diabetes management, blood sugar patterns and problems identified     She still has inadequate control with A1c 7.8  She has variable readings and high readings late afternoon, most likely not having high readings midday Also postprandial readings after supper is not control even though fasting readings in the fairly good Discussed that she is not getting the best control or the ability to adjust her insulin with premixed formulation now. Discussed options of doing mealtime and basal insulins are doing the V-go pump Discussed how the V-go pump works and the likely improvement in blood sugar control and flexibility as well as more convenient method of insulin administration   HYPERTENSION: not well controlled, taking only minimal medications for this  HYPERLIPIDEMIA:LDL 101  PLAN:     She will be scheduled to see the nurse educator for the V-go pump start as long as her insurance covers  this adequately ; she Will probably need a 20 unit basal and 4-6 units boluses with meals   she needs to try and take her evening insulin 15 minutes before eating rather than arbitrarily at 7 PM   needs to bring her monitor for download on each visit   more consistent monitoring at various times especially after meals   she should have her diet also reviewed by nurse educator    RAMIPRIL to be increased to 5 mg until she sees her PCP   Patient Instructions  Check blood sugars on waking up  Every 2 days  Also check blood sugars about 2 hours after a meal and do this after different meals by rotation  Recommended blood  sugar levels on waking up is 90-130 and about 2 hours after meal is 130-160  Please bring your blood sugar monitor to each visit, thank you  Take pm insulin 15 min before supper  Take 20 units in am  Take 2 ramipril daily   Counseling time on subjects discussed above is over 50% of today's 25 minute visit  Veronica Fletcher 08/30/2015, 8:56 AM   Note: This office note was prepared with Estate agent. Any transcriptional errors that result from this process are unintentional.

## 2015-09-05 ENCOUNTER — Telehealth: Payer: Self-pay | Admitting: Endocrinology

## 2015-09-05 NOTE — Telephone Encounter (Signed)
vgo customer care calling to let us know vgo is covered with the copay card at local pharmacy

## 2015-09-06 NOTE — Telephone Encounter (Signed)
Noted  

## 2015-09-25 ENCOUNTER — Encounter: Payer: 59 | Admitting: Nutrition

## 2015-09-29 ENCOUNTER — Other Ambulatory Visit: Payer: Self-pay | Admitting: Endocrinology

## 2015-09-29 DIAGNOSIS — E1165 Type 2 diabetes mellitus with hyperglycemia: Secondary | ICD-10-CM

## 2015-09-29 DIAGNOSIS — Z794 Long term (current) use of insulin: Secondary | ICD-10-CM

## 2015-10-01 ENCOUNTER — Other Ambulatory Visit: Payer: Self-pay | Admitting: *Deleted

## 2015-10-01 DIAGNOSIS — E1165 Type 2 diabetes mellitus with hyperglycemia: Secondary | ICD-10-CM

## 2015-10-01 DIAGNOSIS — Z794 Long term (current) use of insulin: Secondary | ICD-10-CM

## 2015-10-01 MED ORDER — EMPAGLIFLOZIN 25 MG PO TABS: 25.0000 mg | ORAL_TABLET | Freq: Every day | ORAL | 0 refills | Status: DC

## 2015-10-02 ENCOUNTER — Other Ambulatory Visit: Payer: Self-pay | Admitting: *Deleted

## 2015-10-02 ENCOUNTER — Encounter: Payer: 59 | Attending: Endocrinology | Admitting: Nutrition

## 2015-10-02 DIAGNOSIS — E1165 Type 2 diabetes mellitus with hyperglycemia: Secondary | ICD-10-CM | POA: Diagnosis not present

## 2015-10-02 DIAGNOSIS — Z713 Dietary counseling and surveillance: Secondary | ICD-10-CM | POA: Insufficient documentation

## 2015-10-02 DIAGNOSIS — E118 Type 2 diabetes mellitus with unspecified complications: Secondary | ICD-10-CM

## 2015-10-02 DIAGNOSIS — Z794 Long term (current) use of insulin: Secondary | ICD-10-CM

## 2015-10-02 MED ORDER — GLUCOSE BLOOD VI STRP: ORAL_STRIP | 3 refills | Status: DC

## 2015-10-02 NOTE — Assessment & Plan Note (Addendum)
This patient was trained on how to fill, apply and use the V-Go.  She did not take her Novolog 70/30 insulin this AM, but did not want to start this this morning, due to a procedure she is having on her back this afternoon.  She was told to take her AM dose of 70/30 and start on the V-go before supper tonight at [facility].  She agreed to do this.   She filled a V-go 20 with Novolog insulin and will apply this to her upper right abdoment tonight when she gets off work.   She was told to stop her 70/30 insulin and not use this any more.   She was given a V-go 20 starter kit with Novolog insulin and directions for use, and a 800 telephone number if she has questions with this.   She was given a log book and told to test her blood sugars before meals and at bedtime, and to bring the record when she sees Dr. Dwyane Dee on [place].  She agreed to do this.   She was told to call if blood sugars drop low, or remain over 200. Written instructions were given for 2-3 button presses before all meals.  We discussed what carbs are, and when she would need 3 button presses.  She reported good understanding of this and had no final questions. She requested a new meter and was given a Bayer contour Next meter.  She sayd she had a old Risk analyst.  Date and time were set for her.

## 2015-10-02 NOTE — Patient Instructions (Signed)
Fill and apply a new V-go every 24 hours with novolog insulin Stop the old insulin Test blood sugars before meals and at bedtime. Call if blood sugars drop low.

## 2015-10-04 ENCOUNTER — Other Ambulatory Visit: Payer: Self-pay | Admitting: *Deleted

## 2015-10-04 ENCOUNTER — Ambulatory Visit: Payer: 59 | Admitting: Endocrinology

## 2015-10-04 MED ORDER — ONETOUCH DELICA LANCETS 33G MISC: 2 refills | Status: DC

## 2015-10-04 MED ORDER — GLUCOSE BLOOD VI STRP: ORAL_STRIP | 2 refills | Status: DC

## 2015-10-04 MED ORDER — ONETOUCH VERIO FLEX SYSTEM W/DEVICE KIT
1.0000 | PACK | Freq: Four times a day (QID) | 0 refills | Status: DC
Start: 2015-10-04 — End: 2018-07-01

## 2015-10-05 ENCOUNTER — Ambulatory Visit (INDEPENDENT_AMBULATORY_CARE_PROVIDER_SITE_OTHER): Payer: 59 | Admitting: Endocrinology

## 2015-10-05 ENCOUNTER — Encounter: Payer: Self-pay | Admitting: Endocrinology

## 2015-10-05 VITALS — BP 135/82 | HR 94 | Temp 97.9°F | Resp 16 | Ht 63.0 in | Wt 218.8 lb

## 2015-10-05 DIAGNOSIS — Z794 Long term (current) use of insulin: Secondary | ICD-10-CM

## 2015-10-05 DIAGNOSIS — E1165 Type 2 diabetes mellitus with hyperglycemia: Secondary | ICD-10-CM

## 2015-10-05 MED ORDER — RAMIPRIL 5 MG PO CAPS: 5.0000 mg | ORAL_CAPSULE | Freq: Every day | ORAL | 3 refills | Status: DC

## 2015-10-05 NOTE — Progress Notes (Signed)
Patient ID: Veronica Fletcher, female   DOB: Apr 19, 1965, 50 y.o.   MRN: [id number]           Reason for Appointment: Follow-up for Type 2 Diabetes  Referring physician: Harrington Challenger  History of Present Illness:          Date of diagnosis of type 2 diabetes mellitus: 2005       Background history:  She had been started on metformin initially and not clear what other treatment she had taken subsequently, records are being awaited today She apparently was doing very well with a combination of Victoza and metformin until 2015 and she thinks she was losing weight with this.  She had taken Victoza for several months at least However according to his lab records her best A1c in 2014 was only 7.9 Apparently she was taken off Victoza when she was admitted for abdominal pain presumed to be from pancreatitis in 01/2013 She was then started on premixed insulin and taken off metformin also on discharge.  Recent history:   She was referred here for poorly controlled diabetes with A1c consistently around 10% On her initial consultation she was given metformin and Jardiance in addition to her insulin   INSULIN regimen is: V-go pump 20 unit basal and boluses 4 units  Non-insulin hyperglycemic drugs: Jardiance 25 mg daily, metformin ER 2000 mg daily  Current management, blood sugar patterns and problems identified:  Because of inadequate control with premixed insulin and high readings in the evenings she was switched to the V-go pump on 9/19  She did not operate the pump properly the first day but subsequently has been pushing the right buttons  Taking only 4 units bolus for each meal  Has done blood sugars before meals but not after supper with the following results:  She feels comfortable using the pump and likes to continue this  Side effects from medications have been:?  Victoza caused pancreatitis  Compliance with the medical regimen: Good Hypoglycemia:    Glucose monitoring:  done 1-2 times a  day         Glucometer:   Contour   Blood Glucose readings by   Mean values apply above for all meters except median for One Touch  PRE-MEAL Fasting Lunch Dinner Bedtime Overall  Glucose range: 111-129  155-170  72     Mean/median:         :Self-care: The diet that the patient has been following is: tries to limit portions.      Typical meal intake: Breakfast is oatmeal, Breakfast wrap, lunch is a grilled chicken, evening meal is meat and 2 vegetables, will have snacks on yogurt, peanuts and grapes      Dinner 8 pm           Dietician visit, most recent:2014               Exercise:Some walking at work, 15-30 min 3-5/7 days     Weight history:   Wt Readings from Last 3 Encounters:  10/05/15 218 lb 12.8 oz (99.2 kg)  08/30/15 215 lb (97.5 kg)  05/30/15 219 lb 6.4 oz (99.5 kg)    Glycemic control:   Lab Results  Component Value Date   HGBA1C 7.8 (H) 08/27/2015   HGBA1C 10.0 03/29/2015   HGBA1C 10.0 (H) 02/06/2013   Lab Results  Component Value Date   LDLCALC 110 (H) 02/07/2013   CREATININE 0.90 08/27/2015   No results found for: MICRALBCREAT   No visits with results  within 1 Week(s) from this visit.  Latest known visit with results is:  Lab on 08/27/2015  Component Date Value Ref Range Status  . Sodium 08/27/2015 141  135 - 145 mEq/L Final  . Potassium 08/27/2015 4.0  3.5 - 5.1 mEq/L Final  . Chloride 08/27/2015 105  96 - 112 mEq/L Final  . CO2 08/27/2015 27  19 - 32 mEq/L Final  . Glucose, Bld 08/27/2015 155* 70 - 99 mg/dL Final  . BUN 08/27/2015 13  6 - 23 mg/dL Final  . Creatinine, Ser 08/27/2015 0.90  0.40 - 1.20 mg/dL Final  . Total Bilirubin 08/27/2015 0.3  0.2 - 1.2 mg/dL Final  . Alkaline Phosphatase 08/27/2015 82  39 - 117 U/L Final  . AST 08/27/2015 16  0 - 37 U/L Final  . ALT 08/27/2015 15  0 - 35 U/L Final  . Total Protein 08/27/2015 8.0  6.0 - 8.3 g/dL Final  . Albumin 08/27/2015 4.5  3.5 - 5.2 g/dL Final  . Calcium 08/27/2015 9.7  8.4 - 10.5  mg/dL Final  . GFR 08/27/2015 85.24  >60.00 mL/min Final  . Cholesterol 08/27/2015 192  0 - 200 mg/dL Final  . Triglycerides 08/27/2015 202.0* 0.0 - 149.0 mg/dL Final  . HDL 08/27/2015 44.50  >39.00 mg/dL Final  . VLDL 08/27/2015 40.4* 0.0 - 40.0 mg/dL Final  . Total CHOL/HDL Ratio 08/27/2015 4   Final  . NonHDL 08/27/2015 147.57   Final  . Hgb A1c MFr Bld 08/27/2015 7.8* 4.6 - 6.5 % Final  . Direct LDL 08/27/2015 101.0  mg/dL Final        Medication List       Accurate as of 10/05/15  2:06 PM. Always use your most recent med list.          acetaminophen-codeine 300-30 MG tablet Commonly known as:  TYLENOL #3   aspirin EC 81 MG tablet Take 81 mg by mouth every morning.   atorvastatin 10 MG tablet Commonly known as:  LIPITOR Take 1 tablet (10 mg total) by mouth daily at 6 PM.   carvedilol 3.125 MG tablet Commonly known as:  COREG Take 3.125 mg by mouth daily. Reported on 03/28/2015   dicyclomine 20 MG tablet Commonly known as:  BENTYL Take 1 tablet (20 mg total) by mouth 2 (two) times daily.   empagliflozin 25 MG Tabs tablet Commonly known as:  JARDIANCE Take 25 mg by mouth daily.   EX-LAX ULTRA 5 MG EC tablet Generic drug:  bisacodyl Take 5 mg by mouth daily as needed for mild constipation or moderate constipation.   gabapentin 300 MG capsule Commonly known as:  NEURONTIN Take 300 mg by mouth at bedtime. Takes 2 tablets at bedtime   glucose blood test strip Commonly known as:  ONETOUCH VERIO Use as instructed to check blood sugar 4 times per day dx code E11.9   GOODY PM PO Take 1 packet by mouth at bedtime as needed. For pain and sleep   insulin aspart protamine- aspart (70-30) 100 UNIT/ML injection Commonly known as:  NOVOLOG MIX 70/30 Inject 18-20 Units into the skin 2 (two) times daily with a meal. 18 units taken in the morning 20 units taken at night   meloxicam 7.5 MG tablet Commonly known as:  MOBIC Take 7.5 mg by mouth daily.   metFORMIN 500 MG  24 hr tablet Commonly known as:  GLUCOPHAGE-XR TAKE 4 TABLETS (2,000 MG TOTAL) BY MOUTH DAILY WITH SUPPER.   multivitamin with minerals tablet  Take 1 tablet by mouth every morning. Patient takes Flinstones complete with Vitamin D   ONETOUCH DELICA LANCETS 14E Misc Use to check sugar after meals and at bedtime   [place] w/Device Kit 1 each by Does not apply route 4 (four) times daily.   ramipril 5 MG capsule Commonly known as:  ALTACE Take 1 capsule (5 mg total) by mouth daily.   traMADol 50 MG tablet Commonly known as:  ULTRAM Take 50 mg by mouth every 12 (twelve) hours as needed.       Allergies:  Allergies  Allergen Reactions  . Liraglutide     Other reaction(s): Pancreatitis    Past Medical History:  Diagnosis Date  . Diabetes mellitus   . Hyperlipidemia   . Hypertension     Past Surgical History:  Procedure Laterality Date  . CESAREAN SECTION    . CHOLECYSTECTOMY    . GANGLION CYST EXCISION    . LAPAROSCOPY      Family History  Problem Relation Age of Onset  . Diabetes Mother   . Hypertension Mother   . Diabetes Maternal Aunt   . Diabetes Paternal Aunt   . Diabetes Maternal Grandmother   . Heart disease Neg Hx     Social History:  reports that she quit smoking about 2 years ago. Her smoking use included Cigarettes. She smoked 0.50 packs per day. She has never used smokeless tobacco. She reports that she drinks alcohol. She reports that she does not use drugs.    Review of Systems    Lipid history:  on Lipitor 10 mg    Lab Results  Component Value Date   CHOL 192 08/27/2015   HDL 44.50 08/27/2015   LDLCALC 110 (H) 02/07/2013   LDLDIRECT 101.0 08/27/2015   TRIG 202.0 (H) 08/27/2015   CHOLHDL 4 08/27/2015           Hypertension: Present for about 2 years, Because of consistently high blood pressure ramipril was increased to 5 mg on the last visit   BP Readings from Last 3 Encounters:  10/05/15 (!) 146/92  08/30/15 (!)  160/82  05/30/15 (!) 158/88    Most recent eye exam was 8/16  Most recent foot exam: 2/17  Review of Systems    Physical Examination:  BP (!) 146/92   Pulse 94   Temp 97.9 F (36.6 C)   Resp 16   Ht _0  (1.6 m)   Wt 218 lb 12.8 oz (99.2 kg)   LMP 07/09/2011   SpO2 97%   BMI 38.76 kg/m    ASSESSMENT:  Diabetes type 2, uncontrolled See history of present illness for detailed discussion of current diabetes management, blood sugar patterns and problems identified     She is starting a V-go pump this week Previously has had inadequate control with A1c 7.8 So far blood sugars have been excellent with only one relatively high reading when she did not take her bolus for breakfast No difficulties using the pump and she is agreeing to continue this   HYPERTENSION: Now well controlled, better with 5 mg ramipril   PLAN:     She will continue the same regimen with a 20 unit basal and 4 units mealtime boluses  She will try to be consistent with bolusing and changing the pump at the same time  Review blood sugars by phone next week    RAMIPRIL 5 mg prescription sent  Follow-up with PCP also   There are no  Patient Instructions on file for this visit.  Veronica Fletcher 10/05/2015, 2:06 PM   Note: This office note was prepared with Dragon voice recognition system technology. Any transcriptional errors that result from this process are unintentional.

## 2015-10-08 ENCOUNTER — Telehealth: Payer: Self-pay | Admitting: Endocrinology

## 2015-10-08 NOTE — Telephone Encounter (Signed)
Looking good, confirm that she took her bolus clicks at breakfast on Sunday.  If so she may need 3 clicks for larger meals or more starch

## 2015-10-08 NOTE — Telephone Encounter (Signed)
BS readings Saturday breakfast 120 lunch 89 dinner 140 Sunday break 82 lunch 170 dinner 113 This AM 110

## 2015-10-08 NOTE — Telephone Encounter (Signed)
Please see blood sugar readings below and advise.

## 2015-10-08 NOTE — Telephone Encounter (Signed)
Detailed message left on patients answering machine.

## 2015-10-09 ENCOUNTER — Other Ambulatory Visit: Payer: Self-pay | Admitting: *Deleted

## 2015-10-09 MED ORDER — V-GO 20 KIT: PACK | 2 refills | Status: DC

## 2015-10-09 NOTE — Telephone Encounter (Signed)
Patient need the pump sent to her pharmacy, she stated she only had the starter kit.    [phone](Phone) [phone](Fax)

## 2015-10-17 ENCOUNTER — Other Ambulatory Visit: Payer: Self-pay | Admitting: Endocrinology

## 2015-10-17 DIAGNOSIS — E1165 Type 2 diabetes mellitus with hyperglycemia: Secondary | ICD-10-CM

## 2015-10-17 DIAGNOSIS — Z794 Long term (current) use of insulin: Secondary | ICD-10-CM

## 2015-10-19 ENCOUNTER — Telehealth: Payer: Self-pay | Admitting: Endocrinology

## 2015-10-19 ENCOUNTER — Other Ambulatory Visit: Payer: Self-pay | Admitting: *Deleted

## 2015-10-19 MED ORDER — INSULIN ASPART 100 UNIT/ML ~~LOC~~ SOLN: SUBCUTANEOUS | 2 refills | Status: DC

## 2015-10-19 NOTE — Telephone Encounter (Signed)
NovoLog

## 2015-10-19 NOTE — Telephone Encounter (Signed)
Is she suppose to be on Novolog mix for her V-go?

## 2015-10-19 NOTE — Telephone Encounter (Signed)
Pt needs [place] to call into [facility] on [address] the insulin for her vgo machine

## 2015-10-19 NOTE — Telephone Encounter (Signed)
Noted, rx sent for Novolog

## 2015-10-19 NOTE — Telephone Encounter (Signed)
novolog mix

## 2015-11-30 ENCOUNTER — Other Ambulatory Visit: Payer: 59

## 2015-12-04 ENCOUNTER — Other Ambulatory Visit (INDEPENDENT_AMBULATORY_CARE_PROVIDER_SITE_OTHER): Payer: 59

## 2015-12-04 ENCOUNTER — Encounter: Payer: Self-pay | Admitting: Endocrinology

## 2015-12-04 DIAGNOSIS — Z794 Long term (current) use of insulin: Secondary | ICD-10-CM

## 2015-12-04 DIAGNOSIS — E1165 Type 2 diabetes mellitus with hyperglycemia: Secondary | ICD-10-CM

## 2015-12-04 LAB — BASIC METABOLIC PANEL
BUN: 10 mg/dL (ref 6–23)
CALCIUM: 9.3 mg/dL (ref 8.4–10.5)
CO2: 28 mEq/L (ref 19–32)
Chloride: 103 mEq/L (ref 96–112)
Creatinine, Ser: 0.66 mg/dL (ref 0.40–1.20)
GFR: 121.78 mL/min (ref >60.00)
Glucose, Bld: 115 mg/dL — ABNORMAL HIGH (ref 70–99)
POTASSIUM: 3.7 meq/L (ref 3.5–5.1)
SODIUM: 140 meq/L (ref 135–145)

## 2015-12-04 LAB — HEMOGLOBIN A1C: HEMOGLOBIN A1C: 7.4 % — AB (ref 4.6–6.5)

## 2015-12-05 ENCOUNTER — Ambulatory Visit: Payer: 59 | Admitting: Endocrinology

## 2015-12-12 ENCOUNTER — Other Ambulatory Visit: Payer: Self-pay | Admitting: Endocrinology

## 2015-12-12 ENCOUNTER — Ambulatory Visit: Payer: 59 | Admitting: Endocrinology

## 2015-12-12 DIAGNOSIS — Z794 Long term (current) use of insulin: Secondary | ICD-10-CM

## 2015-12-12 DIAGNOSIS — E1165 Type 2 diabetes mellitus with hyperglycemia: Secondary | ICD-10-CM

## 2015-12-13 ENCOUNTER — Ambulatory Visit (INDEPENDENT_AMBULATORY_CARE_PROVIDER_SITE_OTHER): Payer: 59 | Admitting: Endocrinology

## 2015-12-13 ENCOUNTER — Encounter: Payer: Self-pay | Admitting: Endocrinology

## 2015-12-13 VITALS — BP 120/78 | HR 101 | Wt 216.0 lb

## 2015-12-13 DIAGNOSIS — E1165 Type 2 diabetes mellitus with hyperglycemia: Secondary | ICD-10-CM

## 2015-12-13 DIAGNOSIS — Z794 Long term (current) use of insulin: Secondary | ICD-10-CM | POA: Diagnosis not present

## 2015-12-13 LAB — MICROALBUMIN / CREATININE URINE RATIO
CREATININE, U: 29.9 mg/dL
MICROALB UR: 2.6 mg/dL — AB (ref 0.0–1.9)
MICROALB/CREAT RATIO: 8.7 mg/g (ref 0.0–30.0)

## 2015-12-13 NOTE — Progress Notes (Signed)
Patient ID: Veronica Fletcher, female   DOB: 03/30/1965, 50 y.o.   MRN: [id number]           Reason for Appointment: Follow-up for Type 2 Diabetes  Referring physician: Tenny Craw  History of Present Illness:          Date of diagnosis of type 2 diabetes mellitus: 2005       Background history:  She had been started on metformin initially and not clear what other treatment she had taken subsequently, records are being awaited today She apparently was doing very well with a combination of Victoza and metformin until 2015 and she thinks she was losing weight with this.  She had taken Victoza for several months at least However according to his lab records her best A1c in 2014 was only 7.9 Apparently she was taken off Victoza when she was admitted for abdominal pain presumed to be from pancreatitis in 01/2013 She was then started on premixed insulin and taken off metformin also on discharge. She was referred here for poorly controlled diabetes with A1c consistently around 10%  Recent history:   On her initial consultation she was given metformin and Jardiance in addition to her insulin   INSULIN regimen is: Novolog Mix at meals: 18--16 Previously on V-go pump 20 unit basal and boluses 4 units  Non-insulin hyperglycemic drugs: Jardiance 25 mg daily, metformin ER 2000 mg daily  Current management, blood sugar patterns and problems identified:  Because of inadequate control with premixed insulin and high readings in the evenings she was switched to the V-go pump in 9/19  However she was starting to get low blood sugars about 4 weeks ago and she was switched from the pump back to the 70/30 insulin by her PCP  She did not bring her meter today for download  However she thinks her blood sugars are recently fairly good with fastings around 90 and highest reading about 140  Previously had tendency to high readings after supper before using the pump but she thinks this was partly related to her not  taking her evening insulin before eating  Recently not able to do much physical activity because of low back pain  Side effects from medications have been:?  Victoza caused pancreatitis  Compliance with the medical regimen: Good Hypoglycemia:    Glucose monitoring:  done 1-2 times a day         Glucometer:   Contour   Blood Glucose readings by recall  Mean values apply above for all meters except median for One Touch  PRE-MEAL Fasting Lunch Dinner Bedtime Overall  Glucose range: 55-90   120-130   Mean/median:         :Self-care: The diet that the patient has been following is: tries to limit portions.      Typical meal intake: Breakfast is oatmeal, Breakfast wrap, lunch is a grilled chicken, evening meal is meat and 2 vegetables, will have snacks on yogurt, peanuts and grapes      Dinner 8 pm           Dietician visit, most recent:2014               Exercise: none     Weight history:   Wt Readings from Last 3 Encounters:  12/13/15 216 lb (98 kg)  10/05/15 218 lb 12.8 oz (99.2 kg)  08/30/15 215 lb (97.5 kg)    Glycemic control:   Lab Results  Component Value Date   HGBA1C 7.4 (H) 12/04/2015  HGBA1C 7.8 (H) 08/27/2015   HGBA1C 10.0 03/29/2015   Lab Results  Component Value Date   LDLCALC 110 (H) 02/07/2013   CREATININE 0.66 12/04/2015   No results found for: MICRALBCREAT   No visits with results within 1 Week(s) from this visit.  Latest known visit with results is:  Lab on 12/04/2015  Component Date Value Ref Range Status  . Hgb A1c MFr Bld 12/04/2015 7.4* 4.6 - 6.5 % Final  . Sodium 12/04/2015 140  135 - 145 mEq/L Final  . Potassium 12/04/2015 3.7  3.5 - 5.1 mEq/L Final  . Chloride 12/04/2015 103  96 - 112 mEq/L Final  . CO2 12/04/2015 28  19 - 32 mEq/L Final  . Glucose, Bld 12/04/2015 115* 70 - 99 mg/dL Final  . BUN 12/04/2015 10  6 - 23 mg/dL Final  . Creatinine, Ser 12/04/2015 0.66  0.40 - 1.20 mg/dL Final  . Calcium 12/04/2015 9.3  8.4 - 10.5 mg/dL  Final  . GFR 12/04/2015 121.78  >60.00 mL/min Final        Medication List       Accurate as of 12/13/15  2:15 PM. Always use your most recent med list.          acetaminophen-codeine 300-30 MG tablet Commonly known as:  TYLENOL #3   amLODipine 5 MG tablet Commonly known as:  NORVASC Take 5 mg by mouth daily.   aspirin EC 81 MG tablet Take 81 mg by mouth every morning.   atorvastatin 10 MG tablet Commonly known as:  LIPITOR Take 1 tablet (10 mg total) by mouth daily at 6 PM.   cyclobenzaprine 10 MG tablet Commonly known as:  FLEXERIL Take 10 mg by mouth as needed.   dicyclomine 20 MG tablet Commonly known as:  BENTYL Take 1 tablet (20 mg total) by mouth 2 (two) times daily.   EX-LAX ULTRA 5 MG EC tablet Generic drug:  bisacodyl Take 5 mg by mouth daily as needed for mild constipation or moderate constipation.   gabapentin 300 MG capsule Commonly known as:  NEURONTIN Take 300 mg by mouth at bedtime. Takes 2 tablets at bedtime   glucose blood test strip Commonly known as:  ONETOUCH VERIO Use as instructed to check blood sugar 4 times per day dx code E11.9   GOODY PM PO Take 1 packet by mouth at bedtime as needed. For pain and sleep   insulin aspart 100 UNIT/ML injection Commonly known as:  novoLOG Use max 56 units per day with V-go   JARDIANCE 25 MG Tabs tablet Generic drug:  empagliflozin TAKE 1 TABLET BY MOUTH EVERY DAY   meloxicam 7.5 MG tablet Commonly known as:  MOBIC Take 7.5 mg by mouth daily.   metFORMIN 500 MG 24 hr tablet Commonly known as:  GLUCOPHAGE-XR TAKE 4 TABLETS (2,000 MG TOTAL) BY MOUTH DAILY WITH SUPPER.   multivitamin with minerals tablet Take 1 tablet by mouth every morning. Patient takes Flinstones complete with Vitamin D   ONETOUCH DELICA LANCETS 72I Misc Use to check sugar after meals and at bedtime   [place] w/Device Kit 1 each by Does not apply route 4 (four) times daily.   ramipril 5 MG  capsule Commonly known as:  ALTACE Take 1 capsule (5 mg total) by mouth daily.   traMADol 50 MG tablet Commonly known as:  ULTRAM Take 50 mg by mouth every 12 (twelve) hours as needed.   V-GO 20 Kit Use one per day  Allergies:  Allergies  Allergen Reactions  . Liraglutide     Other reaction(s): Pancreatitis    Past Medical History:  Diagnosis Date  . Diabetes mellitus   . Hyperlipidemia   . Hypertension     Past Surgical History:  Procedure Laterality Date  . CESAREAN SECTION    . CHOLECYSTECTOMY    . GANGLION CYST EXCISION    . LAPAROSCOPY      Family History  Problem Relation Age of Onset  . Diabetes Mother   . Hypertension Mother   . Diabetes Maternal Aunt   . Diabetes Paternal Aunt   . Diabetes Maternal Grandmother   . Heart disease Neg Hx     Social History:  reports that she quit smoking about 2 years ago. Her smoking use included Cigarettes. She smoked 0.50 packs per day. She has never used smokeless tobacco. She reports that she drinks alcohol. She reports that she does not use drugs.    Review of Systems    Lipid history:  on Lipitor 10 mg    Lab Results  Component Value Date   CHOL 192 08/27/2015   HDL 44.50 08/27/2015   LDLCALC 110 (H) 02/07/2013   LDLDIRECT 101.0 08/27/2015   TRIG 202.0 (H) 08/27/2015   CHOLHDL 4 08/27/2015           Hypertension: Present for about 2 years, Because of consistently high blood pressure ramipril was increased to 10 mg and on '5mg'$  amlodipine Previously on carvedilol   BP Readings from Last 3 Encounters:  12/13/15 120/78  10/05/15 135/82  08/30/15 (!) 160/82    Most recent eye exam was 8/16  Most recent foot exam: 2/17  Review of Systems    Physical Examination:  BP 120/78   Pulse (!) 101   Wt 216 lb (98 kg)   LMP 07/09/2011   SpO2 99%   BMI 38.26 kg/m    ASSESSMENT:  Diabetes type 2, uncontrolled See history of present illness for detailed discussion of current diabetes  management, blood sugar patterns and problems identified     Her A1c is improved at 7.4 However this is higher than expected for her blood sugars which were fairly good to the last visit and recently have been on the low side Although she did well with the pump she was reporting low sugars about a month ago and is back on premixed insulin twice a day Unable to confirm that her blood sugars are well controlled as she did not bring her monitor However she reports good readings both morning and after evening meal  Previously has had inadequate control with A1c 7.8 So far blood sugars have been excellent with only one relatively high reading when she did not take her bolus for breakfast No difficulties using the pump and she is agreeing to continue this   HYPERTENSION: Now well controlled, better with 10 mg ramipril and Norvasc started by PCP However her pulse is relatively fast with stopping carvedilol   PLAN:     She will continue the same regimen with NovoLog mix insulin since she had tendency to low sugars with the pump  Check blood sugars at various times as directed and bring monitor for download on each visit   Urine microalbumin to be checked today   Patient Instructions  Stay on injections for now     [facility] 12/13/2015, 2:15 PM   Note: This office note was prepared with Dragon voice recognition system technology. Any transcriptional errors that result from  this process are unintentional.

## 2015-12-13 NOTE — Patient Instructions (Signed)
Stay on injections for now

## 2016-01-03 ENCOUNTER — Other Ambulatory Visit: Payer: Self-pay | Admitting: Gastroenterology

## 2016-01-03 ENCOUNTER — Ambulatory Visit
Admission: RE | Admit: 2016-01-03 | Discharge: 2016-01-03 | Disposition: A | Discharge status: Home / Self Care | Payer: 59 | Source: Ambulatory Visit | Attending: Gastroenterology | Admitting: Gastroenterology

## 2016-01-03 DIAGNOSIS — R112 Nausea with vomiting, unspecified: Secondary | ICD-10-CM

## 2016-02-07 ENCOUNTER — Ambulatory Visit: Payer: 59

## 2016-02-12 ENCOUNTER — Ambulatory Visit: Payer: 59 | Admitting: Endocrinology

## 2016-02-21 ENCOUNTER — Ambulatory Visit: Payer: 59

## 2016-02-21 DIAGNOSIS — Z794 Long term (current) use of insulin: Secondary | ICD-10-CM

## 2016-02-21 DIAGNOSIS — E1165 Type 2 diabetes mellitus with hyperglycemia: Secondary | ICD-10-CM

## 2016-02-21 LAB — BASIC METABOLIC PANEL
BUN: 11 mg/dL (ref 6–23)
CHLORIDE: 103 meq/L (ref 96–112)
CO2: 29 meq/L (ref 19–32)
CREATININE: 0.71 mg/dL (ref 0.40–1.20)
Calcium: 9.4 mg/dL (ref 8.4–10.5)
GFR: 111.84 mL/min (ref >60.00)
Glucose, Bld: 87 mg/dL (ref 70–99)
POTASSIUM: 3.5 meq/L (ref 3.5–5.1)
Sodium: 141 mEq/L (ref 135–145)

## 2016-02-22 LAB — FRUCTOSAMINE: Fructosamine: 307 umol/L — ABNORMAL HIGH (ref 0–285)

## 2016-02-25 ENCOUNTER — Ambulatory Visit: Payer: 59 | Admitting: Endocrinology

## 2016-04-14 NOTE — Progress Notes (Signed)
Patient ID: Veronica Fletcher, female   DOB: Apr 27, 1965, 51 y.o.   MRN: [id number]           Reason for Appointment: Follow-up for Type 2 Diabetes  Referring physician: Harrington Challenger  History of Present Illness:          Date of diagnosis of type 2 diabetes mellitus: 2005       Background history:  She had been started on metformin initially and not clear what other treatment she had taken subsequently, records are being awaited today She apparently was doing very well with a combination of Victoza and metformin until 2015 and she thinks she was losing weight with this.  She had taken Victoza for several months at least However according to his lab records her best A1c in 2014 was only 7.9 Apparently she was taken off Victoza when she was admitted for abdominal pain presumed to be from pancreatitis in 01/2013 She was then started on premixed insulin and taken off metformin also on discharge. She was referred here for poorly controlled diabetes with A1c consistently around 10% On her initial consultation she was given metformin and Jardiance in addition to her insulin   Recent history:   INSULIN regimen is: Novolog Mix at meals: 16--20 Previously on V-go pump 20 unit basal and boluses 4 units  Non-insulin hyperglycemic drugs: Jardiance 25 mg daily, metformin ER 2000 mg daily  Current management, blood sugar patterns and problems identified:  Because of hypoglycemia she was switched back from V-go pump to twice a day premixed insulin in October  However appears to have inadequate control with premixed insulin and high A1c  She did not bring her monitor for download again  She is checking her blood sugars mostly in the morning and sometimes before suppertime but not after meals  She says her FASTING blood sugars are not usually high but today had a high reading of 180, she is frequently getting readings around 90 and her lab glucose was below 90 about 2 months ago  No hypoglycemia  reported  Blood sugars are relatively higher in the afternoon, she says she is trying to eat more salads at lunchtime although will have some snacks with peanut butter crackers otherwise  She says she goes to sleep soon after her evening meal because of continued back pain and will not check her sugar after supper  Also sometimes will not eat much at suppertime but still does take her insulin  Recently not able to do much physical activity because of low back pain  Side effects from medications have been:?  Victoza caused pancreatitis  Compliance with the medical regimen: Good Hypoglycemia:    Glucose monitoring:  done 1-2 times a day         Glucometer:   Contour   Blood Glucose readings by recall  Mean values apply above for all meters except median for One Touch  PRE-MEAL Fasting Lunch Dinner Bedtime Overall  Glucose range: 90-180  120-150    Mean/median:        :Self-care: The diet that the patient has been following is: tries to limit portions.      Typical meal intake: Breakfast is oatmeal, Breakfast wrap, lunch is a grilled chicken, evening meal is meat and 2 vegetables, will have snacks on yogurt, peanuts and grapes      Dinner 6 pm           Dietician visit, most recent:2014  Exercise: none     Weight history:   Wt Readings from Last 3 Encounters:  04/15/16 219 lb (99.3 kg)  12/13/15 216 lb (98 kg)  10/05/15 218 lb 12.8 oz (99.2 kg)    Glycemic control:   Lab Results  Component Value Date   HGBA1C 7.8 04/15/2016   HGBA1C 7.4 (H) 12/04/2015   HGBA1C 7.8 (H) 08/27/2015   Lab Results  Component Value Date   MICROALBUR 2.6 (H) 12/13/2015   LDLCALC 110 (H) 02/07/2013   CREATININE 0.71 02/21/2016   Lab Results  Component Value Date   MICRALBCREAT 8.7 12/13/2015     Office Visit on 04/15/2016  Component Date Value Ref Range Status  . Hemoglobin A1C 04/15/2016 7.8   Final      Allergies as of 04/15/2016      Reactions   Liraglutide     Other reaction(s): Pancreatitis      Medication List       Accurate as of 04/15/16  9:13 AM. Always use your most recent med list.          acetaminophen-codeine 300-30 MG tablet Commonly known as:  TYLENOL #3   amLODipine 5 MG tablet Commonly known as:  NORVASC Take 5 mg by mouth daily.   aspirin EC 81 MG tablet Take 81 mg by mouth every morning.   atorvastatin 10 MG tablet Commonly known as:  LIPITOR Take 1 tablet (10 mg total) by mouth daily at 6 PM.   cyclobenzaprine 10 MG tablet Commonly known as:  FLEXERIL Take 10 mg by mouth as needed.   diclofenac 75 MG EC tablet Commonly known as:  VOLTAREN TAKE 1 TABLET BY MOUTH TWICE A DAY AFTER MEALS FOR INFLAMMATION/PAIN/SWELLING TAKE AS NEEDED ONLY   dicyclomine 20 MG tablet Commonly known as:  BENTYL Take 1 tablet (20 mg total) by mouth 2 (two) times daily.   EX-LAX ULTRA 5 MG EC tablet Generic drug:  bisacodyl Take 5 mg by mouth daily as needed for mild constipation or moderate constipation.   gabapentin 300 MG capsule Commonly known as:  NEURONTIN Take 300 mg by mouth at bedtime. Takes 2 tablets at bedtime   glucose blood test strip Commonly known as:  ONETOUCH VERIO Use as instructed to check blood sugar 4 times per day dx code E11.9   GOODY PM PO Take 1 packet by mouth at bedtime as needed. For pain and sleep   insulin aspart 100 UNIT/ML injection Commonly known as:  novoLOG Use max 56 units per day with V-go   JARDIANCE 25 MG Tabs tablet Generic drug:  empagliflozin TAKE 1 TABLET BY MOUTH EVERY DAY   meloxicam 7.5 MG tablet Commonly known as:  MOBIC Take 7.5 mg by mouth daily.   metFORMIN 500 MG 24 hr tablet Commonly known as:  GLUCOPHAGE-XR TAKE 4 TABLETS (2,000 MG TOTAL) BY MOUTH DAILY WITH SUPPER.   methocarbamol 500 MG tablet Commonly known as:  ROBAXIN TAKE 1 TABLET BY MOUTH TWICE A DAY AS NEEDED FOR SPASM   multivitamin with minerals tablet Take 1 tablet by mouth every morning. Patient  takes Flinstones complete with Vitamin D   NOVOLOG MIX 70/30 FLEXPEN (70-30) 100 UNIT/ML FlexPen Generic drug:  insulin aspart protamine - aspart 20 units in the morning and 16 units at night   ONETOUCH DELICA LANCETS 03K Misc Use to check sugar after meals and at bedtime   ONETOUCH VERIO FLEX SYSTEM w/Device Kit 1 each by Does not apply route 4 (four) times  daily.   ramipril 5 MG capsule Commonly known as:  ALTACE Take 1 capsule (5 mg total) by mouth daily.   traMADol 50 MG tablet Commonly known as:  ULTRAM Take 50 mg by mouth every 12 (twelve) hours as needed.   V-GO 20 Kit Use one per day       Allergies:  Allergies  Allergen Reactions  . Liraglutide     Other reaction(s): Pancreatitis    Past Medical History:  Diagnosis Date  . Diabetes mellitus   . Hyperlipidemia   . Hypertension     Past Surgical History:  Procedure Laterality Date  . CESAREAN SECTION    . CHOLECYSTECTOMY    . GANGLION CYST EXCISION    . LAPAROSCOPY      Family History  Problem Relation Age of Onset  . Diabetes Mother   . Hypertension Mother   . Diabetes Maternal Aunt   . Diabetes Paternal Aunt   . Diabetes Maternal Grandmother   . Heart disease Neg Hx     Social History:  reports that she quit smoking about 3 years ago. Her smoking use included Cigarettes. She smoked 0.50 packs per day. She has never used smokeless tobacco. She reports that she drinks alcohol. She reports that she does not use drugs.    Review of Systems    Lipid history:  on Lipitor 10 mg    Lab Results  Component Value Date   CHOL 192 08/27/2015   HDL 44.50 08/27/2015   LDLCALC 110 (H) 02/07/2013   LDLDIRECT 101.0 08/27/2015   TRIG 202.0 (H) 08/27/2015   CHOLHDL 4 08/27/2015           Hypertension: Present for about 2 years, followed by PCP, currently on ramipril ?  10 mg and on 81m amlodipine Previously on carvedilol Blood pressure with PCP in February was 142/78  BP Readings from Last 3  Encounters:  04/15/16 (!) 160/90  12/13/15 120/78  10/05/15 135/82    Most recent eye exam was 8/16  Most recent foot exam: 2/17  Review of Systems    Physical Examination:  BP (!) 160/90   Pulse 86   Ht 5' 2"  (1.575 m)   Wt 219 lb (99.3 kg)   LMP 07/09/2011   BMI 40.06 kg/m    ASSESSMENT:  Diabetes type 2, uncontrolled See history of present illness for detailed discussion of current diabetes management, blood sugar patterns and problems identified  She has inadequate control with A1c 7.8, however her fructosamine was not very high on her labs 2 months ago She is not monitoring readings after meals and not clear if they are higher after eating especially supper, also not clear why her morning sugars are fluctuating She has not brought her monitor for download Not able to exercise and still not able to lose weight  HYPERTENSION: Blood pressure higher today, likely to be from her increased pain   PLAN:     She will Increase her morning insulin to 18 units for now  Stay on the same 20 units at suppertime unless blood sugars are lower fasting  She will try to check her blood sugars at different times per day including after supper at night  Discussed blood sugar targets at various times  Increase metformin back to 2000 mg a day  Continue Jardiance, will need to monitor her renal function again on the next visit  She is continuing to require recently higher doses of insulin will switch her back to the  pump  Follow-up with PCP for hypertension   Patient Instructions  Try 18 units insulin in am  Metformin 2 twice daily  Some readings at nite   Counseling time on subjects discussed above is over 50% of today's 25 minute visit  Veronica Fletcher 04/15/2016, 9:13 AM   Note: This office note was prepared with Estate agent. Any transcriptional errors that result from this process are unintentional.

## 2016-04-15 ENCOUNTER — Ambulatory Visit (INDEPENDENT_AMBULATORY_CARE_PROVIDER_SITE_OTHER): Payer: 59 | Admitting: Endocrinology

## 2016-04-15 ENCOUNTER — Encounter: Payer: Self-pay | Admitting: Endocrinology

## 2016-04-15 VITALS — BP 160/90 | HR 86 | Ht 62.0 in | Wt 219.0 lb

## 2016-04-15 DIAGNOSIS — E1165 Type 2 diabetes mellitus with hyperglycemia: Secondary | ICD-10-CM | POA: Diagnosis not present

## 2016-04-15 DIAGNOSIS — I10 Essential (primary) hypertension: Secondary | ICD-10-CM

## 2016-04-15 DIAGNOSIS — Z794 Long term (current) use of insulin: Secondary | ICD-10-CM | POA: Diagnosis not present

## 2016-04-15 LAB — POCT GLYCOSYLATED HEMOGLOBIN (HGB A1C): HEMOGLOBIN A1C: 7.8

## 2016-04-15 NOTE — Patient Instructions (Addendum)
Try 18 units insulin in am  Metformin 2 twice daily  Some readings at nite

## 2016-04-30 ENCOUNTER — Other Ambulatory Visit: Payer: Self-pay | Admitting: Endocrinology

## 2016-04-30 DIAGNOSIS — E1165 Type 2 diabetes mellitus with hyperglycemia: Secondary | ICD-10-CM

## 2016-04-30 DIAGNOSIS — Z794 Long term (current) use of insulin: Secondary | ICD-10-CM

## 2016-05-07 ENCOUNTER — Other Ambulatory Visit: Payer: Self-pay

## 2016-05-07 MED ORDER — CANAGLIFLOZIN 300 MG PO TABS: 300.0000 mg | ORAL_TABLET | Freq: Every day | ORAL | 3 refills | Status: DC

## 2016-06-17 ENCOUNTER — Other Ambulatory Visit: Payer: 59

## 2016-06-20 ENCOUNTER — Ambulatory Visit: Payer: 59 | Admitting: Endocrinology

## 2016-07-01 ENCOUNTER — Other Ambulatory Visit: Payer: 59

## 2016-07-02 ENCOUNTER — Other Ambulatory Visit (INDEPENDENT_AMBULATORY_CARE_PROVIDER_SITE_OTHER): Payer: 59

## 2016-07-02 DIAGNOSIS — E1165 Type 2 diabetes mellitus with hyperglycemia: Secondary | ICD-10-CM | POA: Diagnosis not present

## 2016-07-02 DIAGNOSIS — Z794 Long term (current) use of insulin: Secondary | ICD-10-CM | POA: Diagnosis not present

## 2016-07-02 LAB — BASIC METABOLIC PANEL
BUN: 12 mg/dL (ref 6–23)
CALCIUM: 9.6 mg/dL (ref 8.4–10.5)
CO2: 28 meq/L (ref 19–32)
CREATININE: 0.7 mg/dL (ref 0.40–1.20)
Chloride: 101 mEq/L (ref 96–112)
GFR: 113.53 mL/min (ref >60.00)
Glucose, Bld: 55 mg/dL — ABNORMAL LOW (ref 70–99)
Potassium: 3.2 mEq/L — ABNORMAL LOW (ref 3.5–5.1)
Sodium: 138 mEq/L (ref 135–145)

## 2016-07-03 ENCOUNTER — Other Ambulatory Visit: Payer: Self-pay

## 2016-07-03 LAB — FRUCTOSAMINE: FRUCTOSAMINE: 344 umol/L — AB (ref 0–285)

## 2016-07-03 MED ORDER — CANAGLIFLOZIN 300 MG PO TABS: 300.0000 mg | ORAL_TABLET | Freq: Every day | ORAL | 2 refills | Status: DC

## 2016-07-04 ENCOUNTER — Encounter: Payer: Self-pay | Admitting: Endocrinology

## 2016-07-04 ENCOUNTER — Ambulatory Visit (INDEPENDENT_AMBULATORY_CARE_PROVIDER_SITE_OTHER): Payer: 59 | Admitting: Endocrinology

## 2016-07-04 VITALS — BP 134/84 | HR 96 | Ht 62.0 in | Wt 218.0 lb

## 2016-07-04 DIAGNOSIS — E1165 Type 2 diabetes mellitus with hyperglycemia: Secondary | ICD-10-CM

## 2016-07-04 DIAGNOSIS — Z794 Long term (current) use of insulin: Secondary | ICD-10-CM

## 2016-07-04 MED ORDER — SEMAGLUTIDE(0.25 OR 0.5MG/DOS) 2 MG/1.5ML ~~LOC~~ SOPN: 0.2500 mg | PEN_INJECTOR | SUBCUTANEOUS | 3 refills | Status: DC

## 2016-07-04 NOTE — Patient Instructions (Addendum)
Take 22 units in am and 18; 15 min before meals  Take some readings all time  Check blood sugars on waking up  3/7 days  Also check blood sugars about 2 hours after a meal and do this after different meals by rotation  Recommended blood sugar levels on waking up is 90-130 and about 2 hours after meal is 130-160  Please bring your blood sugar monitor to each visit, thank you  Low low carb meals  3 metformin

## 2016-07-04 NOTE — Progress Notes (Signed)
Patient ID: Veronica Fletcher, female   DOB: 1965-11-24, 51 y.o.   MRN: [id number]           Reason for Appointment: Follow-up for Type 2 Diabetes  Referring physician: Harrington Challenger  History of Present Illness:          Date of diagnosis of type 2 diabetes mellitus: 2005       Background history:  She had been started on metformin initially and not clear what other treatment she had taken subsequently, records are being awaited today She apparently was doing very well with a combination of Victoza and metformin until 2015 and she thinks she was losing weight with this.  She had taken Victoza for several months at least However according to his lab records her best A1c in 2014 was only 7.9 Apparently she was taken off Victoza when she was admitted for abdominal pain presumed to be from pancreatitis in 01/2013 She was then started on premixed insulin and taken off metformin also on discharge. She was referred here for poorly controlled diabetes with A1c consistently around 10% On her initial consultation she was given metformin and Jardiance in addition to her insulin   Recent history:   INSULIN regimen is: Novolog Mix at meals: 18-16  Previously on V-go pump 20 unit basal and boluses 4 units  Her A1c in April was 7.8 Fructosamine is high now at 344  Non-insulin hyperglycemic drugs: Invokana 300 mg daily, metformin ER 1000 mg daily  Current management, blood sugar patterns and problems identified:  Since her last visit she says she has started being more active and is able to walk some with less back pain  However her control appears to be quite inadequate and her blood sugars averaging nearly 200 at home  However appears to be checking blood sugars mostly in the mornings and sporadically in the evening, previously had not been compliant with bring her monitor for review  Morning sugars are also variable; if she is delayed in taking her morning insulin until midday her blood sugar was 300  earlier this month on the Saturday  She says she cannot take more than 2 metformin tablets a day because of tendency to diarrhea   She was switched by insurance from [place] to [place] in April  She is asking about difficulty losing weight  She has been reluctant to take multiple injections and continues to take premixed insulin without any adjustment in doses before breakfast and supper  She has not seen a dietitian in a few years, generally tries to eat low fat meals however  Side effects from medications have been:?  Victoza caused pancreatitis  Compliance with the medical regimen: Good Hypoglycemia:    Glucose monitoring:  done 1-2 times a day         Glucometer:   Contour   Blood Glucose readings by   Mean values apply above for all meters except median for One Touch  PRE-MEAL Fasting Lunch Dinner Bedtime Overall  Glucose range:  98-330   146, 200  212    Mean/median:     196     :Self-care: The diet that the patient has been following is: tries to limit portions.      Typical meal intake: Breakfast is oatmeal, Breakfast wrap, lunch is a grilled chicken, evening meal is meat and 2 vegetables, will have snacks on yogurt, peanuts and grapes      Dinner 6 pm  Dietician visit, most recent:2014               Exercise: some walking upto 30 min    Weight history:   Wt Readings from Last 3 Encounters:  07/04/16 218 lb (98.9 kg)  04/15/16 219 lb (99.3 kg)  12/13/15 216 lb (98 kg)    Glycemic control:   Lab Results  Component Value Date   HGBA1C 7.8 04/15/2016   HGBA1C 7.4 (H) 12/04/2015   HGBA1C 7.8 (H) 08/27/2015   Lab Results  Component Value Date   MICROALBUR 2.6 (H) 12/13/2015   LDLCALC 110 (H) 02/07/2013   CREATININE 0.70 07/02/2016   Lab Results  Component Value Date   MICRALBCREAT 8.7 12/13/2015     Lab on 07/02/2016  Component Date Value Ref Range Status  . Sodium 07/02/2016 138  135 - 145 mEq/L Final  . Potassium 07/02/2016 3.2* 3.5  - 5.1 mEq/L Final  . Chloride 07/02/2016 101  96 - 112 mEq/L Final  . CO2 07/02/2016 28  19 - 32 mEq/L Final  . Glucose, Bld 07/02/2016 55* 70 - 99 mg/dL Final  . BUN 07/02/2016 12  6 - 23 mg/dL Final  . Creatinine, Ser 07/02/2016 0.70  0.40 - 1.20 mg/dL Final  . Calcium 07/02/2016 9.6  8.4 - 10.5 mg/dL Final  . GFR 07/02/2016 113.53  >60.00 mL/min Final  . Fructosamine 07/02/2016 344* 0 - 285 umol/L Final   Comment: Published reference interval for apparently healthy subjects between age 47 and 81 is 8 - 285 umol/L and in a poorly controlled diabetic population is 228 - 563 umol/L with a mean of 396 umol/L.       Allergies as of 07/04/2016      Reactions   Liraglutide    Other reaction(s): Pancreatitis      Medication List       Accurate as of 07/04/16 11:59 PM. Always use your most recent med list.          acetaminophen-codeine 300-30 MG tablet Commonly known as:  TYLENOL #3   amLODipine 5 MG tablet Commonly known as:  NORVASC Take 5 mg by mouth daily.   aspirin EC 81 MG tablet Take 81 mg by mouth every morning.   atorvastatin 10 MG tablet Commonly known as:  LIPITOR Take 1 tablet (10 mg total) by mouth daily at 6 PM.   canagliflozin 300 MG Tabs tablet Commonly known as:  INVOKANA Take 1 tablet (300 mg total) by mouth daily before breakfast.   cyclobenzaprine 10 MG tablet Commonly known as:  FLEXERIL Take 10 mg by mouth as needed.   diclofenac 75 MG EC tablet Commonly known as:  VOLTAREN TAKE 1 TABLET BY MOUTH TWICE A DAY AFTER MEALS FOR INFLAMMATION/PAIN/SWELLING TAKE AS NEEDED ONLY   dicyclomine 20 MG tablet Commonly known as:  BENTYL Take 1 tablet (20 mg total) by mouth 2 (two) times daily.   EX-LAX ULTRA 5 MG EC tablet Generic drug:  bisacodyl Take 5 mg by mouth daily as needed for mild constipation or moderate constipation.   gabapentin 300 MG capsule Commonly known as:  NEURONTIN Take 300 mg by mouth at bedtime. Takes 2 tablets at bedtime    glucose blood test strip Commonly known as:  ONETOUCH VERIO Use as instructed to check blood sugar 4 times per day dx code E11.9   GOODY PM PO Take 1 packet by mouth at bedtime as needed. For pain and sleep   meloxicam 7.5 MG tablet Commonly  known as:  MOBIC Take 7.5 mg by mouth daily.   metFORMIN 500 MG 24 hr tablet Commonly known as:  GLUCOPHAGE-XR TAKE 4 TABLETS (2,000 MG TOTAL) BY MOUTH DAILY WITH SUPPER.   methocarbamol 500 MG tablet Commonly known as:  ROBAXIN TAKE 1 TABLET BY MOUTH TWICE A DAY AS NEEDED FOR SPASM   multivitamin with minerals tablet Take 1 tablet by mouth every morning. Patient takes Flinstones complete with Vitamin D   NOVOLOG MIX 70/30 FLEXPEN (70-30) 100 UNIT/ML FlexPen Generic drug:  insulin aspart protamine - aspart 18 units in the morning and 16 units at night   ONETOUCH DELICA LANCETS 11N Misc Use to check sugar after meals and at bedtime   ONETOUCH VERIO FLEX SYSTEM w/Device Kit 1 each by Does not apply route 4 (four) times daily.   ramipril 5 MG capsule Commonly known as:  ALTACE Take 1 capsule (5 mg total) by mouth daily.   Semaglutide 0.25 or 0.5 MG/DOSE Sopn Commonly known as:  OZEMPIC Inject 0.25 mg into the skin once a week. After 4 weeks go to 0.86m   traMADol 50 MG tablet Commonly known as:  ULTRAM Take 50 mg by mouth every 12 (twelve) hours as needed.   V-GO 20 Kit Use one per day       Allergies:  Allergies  Allergen Reactions  . Liraglutide     Other reaction(s): Pancreatitis    Past Medical History:  Diagnosis Date  . Diabetes mellitus   . Hyperlipidemia   . Hypertension     Past Surgical History:  Procedure Laterality Date  . CESAREAN SECTION    . CHOLECYSTECTOMY    . GANGLION CYST EXCISION    . LAPAROSCOPY      Family History  Problem Relation Age of Onset  . Diabetes Mother   . Hypertension Mother   . Diabetes Maternal Aunt   . Diabetes Paternal Aunt   . Diabetes Maternal Grandmother   .  Heart disease Neg Hx     Social History:  reports that she quit smoking about 3 years ago. Her smoking use included Cigarettes. She smoked 0.50 packs per day. She has never used smokeless tobacco. She reports that she drinks alcohol. She reports that she does not use drugs.    Review of Systems    Lipid history:  on Lipitor 10 mg    Lab Results  Component Value Date   CHOL 192 08/27/2015   HDL 44.50 08/27/2015   LDLCALC 110 (H) 02/07/2013   LDLDIRECT 101.0 08/27/2015   TRIG 202.0 (H) 08/27/2015   CHOLHDL 4 08/27/2015           Hypertension: Present for about 2 years, followed by PCP, currently on ramipril ?  10 mg and on 545mamlodipine Previously on carvedilol Blood pressure with PCP in February was 142/78  BP Readings from Last 3 Encounters:  07/04/16 134/84  04/15/16 (!) 160/90  12/13/15 120/78    Most recent eye exam was 8/16  Most recent foot exam: 2/17  Review of Systems    Physical Examination:  BP 134/84   Pulse 96   Ht 5' 2"  (1.575 m)   Wt 218 lb (98.9 kg)   LMP 07/09/2011   SpO2 96%   BMI 39.87 kg/m    ASSESSMENT:  Diabetes type 2, uncontrolled See history of present illness for detailed discussion of current diabetes management, blood sugar patterns and problems identified  She has inadequate control with Fructosamine 344 Discussed in detail  her day-to-day management although she is trying to do fairly well with her activity level and diet compared to before she has not lost weight and her blood sugars are still high Since she is checking mostly fasting readings and these are not consistent difficult to know what her insulin regimen should be However in the past she has not required much basal insulin and likely needs improvement in her postprandial readings consistently  Discussed various treatment options including basal bolus insulin regimen with the kind of insulin Also discussed that even though she had pancreatitis in 2015 there is no  convincing evidence that an GLP-1 drugs cause pancreatitis She does need to lose weight and is not succeeding with Invokana and low-dose metformin  HYPERTENSION: Blood pressure higher today, likely to be from her increased pain   PLAN:   Increase insulin to 22 units in the morning and 18 at suppertime She must take this 15 minutes before eating Discussed the use of GLP-1 drugs, their benefits and role in helping her control Since she may do well with only low-dose Ozempic which is more effective will give her a trial of this Showed her how to use the Ozempic pen, discussed weekly dosage, titration after about 4 weeks Discussed checking blood sugars more consistently at all different times to help with evaluating her diet, insulin regimen and general planning She agrees to a consultation with dietitian, discussed that we need to update her knowledge of meal planning and help plan meals more consistently   Patient Instructions  Take 22 units in am and 18; 15 min before meals  Take some readings all time  Check blood sugars on waking up  3/7 days  Also check blood sugars about 2 hours after a meal and do this after different meals by rotation  Recommended blood sugar levels on waking up is 90-130 and about 2 hours after meal is 130-160  Please bring your blood sugar monitor to each visit, thank you  Low low carb meals  3 metformin   Counseling time on subjects discussed above is over 50% of today's 25 minute visit  Khristie Sak 07/06/2016, 9:20 PM   Note: This office note was prepared with Dragon voice recognition system technology. Any transcriptional errors that result from this process are unintentional.

## 2016-07-07 ENCOUNTER — Other Ambulatory Visit: Payer: Self-pay

## 2016-07-07 ENCOUNTER — Telehealth: Payer: Self-pay | Admitting: Endocrinology

## 2016-07-07 DIAGNOSIS — Z794 Long term (current) use of insulin: Secondary | ICD-10-CM

## 2016-07-07 DIAGNOSIS — E1165 Type 2 diabetes mellitus with hyperglycemia: Secondary | ICD-10-CM

## 2016-07-07 MED ORDER — SEMAGLUTIDE(0.25 OR 0.5MG/DOS) 2 MG/1.5ML ~~LOC~~ SOPN: 0.2500 mg | PEN_INJECTOR | SUBCUTANEOUS | 3 refills | Status: DC

## 2016-07-07 MED ORDER — METFORMIN HCL ER 500 MG PO TB24: 2000.0000 mg | ORAL_TABLET | Freq: Every day | ORAL | 0 refills | Status: DC

## 2016-07-07 NOTE — Telephone Encounter (Signed)
Called patient and left a voice message to let her know that her refills have been sent to the appropriate pharmacy ([facility]) and I have also faxed her last office visit notes to Dr. Melinda Crutch at [facility] on [address].

## 2016-07-07 NOTE — Telephone Encounter (Signed)
**  Remind patient they can make refill requests via MyChart**  Medication refill request (Name & Dosage): metFORMIN (GLUCOPHAGE-XR) 500 MG 24 hr tablet [[id number]]   Semaglutide (OZEMPIC) 0.25 or 0.5 MG/DOSE SOPN [[id number]]    Preferred pharmacy (Name & Address): [id number] [place], [place] [phone] (Phone) [phone] (Fax)       Other comments (if applicable):  Patient states prescriptions were sent to wrong pharmacy per patient.  Also asking for last visit notes to be sent to Dr Harle Battiest at [place] [facility].

## 2016-07-07 NOTE — Telephone Encounter (Signed)
I just resent the fax again. Hopefully they will receive it this time.

## 2016-07-07 NOTE — Telephone Encounter (Signed)
April from [facility] called to ask that the notes be resent for the last office visit note to [phone], the office did not get the fax yet.

## 2016-07-28 ENCOUNTER — Other Ambulatory Visit: Payer: Self-pay

## 2016-07-28 MED ORDER — CANAGLIFLOZIN 300 MG PO TABS: 300.0000 mg | ORAL_TABLET | Freq: Every day | ORAL | 2 refills | Status: DC

## 2016-08-13 ENCOUNTER — Other Ambulatory Visit (INDEPENDENT_AMBULATORY_CARE_PROVIDER_SITE_OTHER): Payer: 59

## 2016-08-13 DIAGNOSIS — E1165 Type 2 diabetes mellitus with hyperglycemia: Secondary | ICD-10-CM | POA: Diagnosis not present

## 2016-08-13 DIAGNOSIS — Z794 Long term (current) use of insulin: Secondary | ICD-10-CM | POA: Diagnosis not present

## 2016-08-13 LAB — LIPID PANEL
CHOL/HDL RATIO: 4
Cholesterol: 184 mg/dL (ref 0–200)
HDL: 41.9 mg/dL (ref >39.00)
LDL Cholesterol: 119 mg/dL — ABNORMAL HIGH (ref 0–99)
NONHDL: 141.8
Triglycerides: 112 mg/dL (ref 0.0–149.0)
VLDL: 22.4 mg/dL (ref 0.0–40.0)

## 2016-08-13 LAB — BASIC METABOLIC PANEL
BUN: 11 mg/dL (ref 6–23)
CALCIUM: 9.5 mg/dL (ref 8.4–10.5)
CO2: 29 meq/L (ref 19–32)
CREATININE: 0.69 mg/dL (ref 0.40–1.20)
Chloride: 102 mEq/L (ref 96–112)
GFR: 115.37 mL/min (ref >60.00)
GLUCOSE: 134 mg/dL — AB (ref 70–99)
Potassium: 3.6 mEq/L (ref 3.5–5.1)
Sodium: 139 mEq/L (ref 135–145)

## 2016-08-13 LAB — HEMOGLOBIN A1C: Hgb A1c MFr Bld: 8.5 % — ABNORMAL HIGH (ref 4.6–6.5)

## 2016-08-15 ENCOUNTER — Ambulatory Visit: Payer: 59 | Admitting: Endocrinology

## 2016-08-18 ENCOUNTER — Encounter: Payer: Self-pay | Admitting: Endocrinology

## 2016-08-18 ENCOUNTER — Ambulatory Visit (INDEPENDENT_AMBULATORY_CARE_PROVIDER_SITE_OTHER): Payer: 59 | Admitting: Endocrinology

## 2016-08-18 VITALS — BP 140/84 | HR 99 | Ht 62.0 in | Wt 210.2 lb

## 2016-08-18 DIAGNOSIS — E1165 Type 2 diabetes mellitus with hyperglycemia: Secondary | ICD-10-CM | POA: Diagnosis not present

## 2016-08-18 DIAGNOSIS — Z794 Long term (current) use of insulin: Secondary | ICD-10-CM

## 2016-08-18 NOTE — Patient Instructions (Signed)
Check blood sugars on waking up 3-4/7   Also check blood sugars about 2 hours after a meal and do this after different meals by rotation  Recommended blood sugar levels on waking up is 90-130 and about 2 hours after meal is 130-160  Please bring your blood sugar monitor to each visit, thank you   

## 2016-08-18 NOTE — Progress Notes (Signed)
Patient ID: Veronica Fletcher, female   DOB: 08/09/1965, 51 y.o.   MRN: [id number]           Reason for Appointment: Follow-up for Type 2 Diabetes  Referring physician: Harrington Challenger  History of Present Illness:          Date of diagnosis of type 2 diabetes mellitus: 2005       Background history:  She had been started on metformin initially and not clear what other treatment she had taken subsequently, records are being awaited today She apparently was doing very well with a combination of Victoza and metformin until 2015 and she thinks she was losing weight with this.  She had taken Victoza for several months at least However according to his lab records her best A1c in 2014 was only 7.9 Apparently she was taken off Victoza when she was admitted for abdominal pain presumed to be from pancreatitis in 01/2013 She was then started on premixed insulin and taken off metformin also on discharge. She was referred here for poorly controlled diabetes with A1c consistently around 10% On her initial consultation she was given metformin and Jardiance in addition to her insulin   Recent history:   INSULIN regimen is: Novolog Mix at meals: 22-18 Previously on V-go pump 20 unit basal and boluses 4 units  Non-insulin hyperglycemic drugs: Invokana 300 mg daily, metformin ER 1000 mg daily Ozempic 0.5 mg weekly  Her A1c in April was 7.8 and is now 8.5 Fructosamine previously was high at 344  Current management, blood sugar patterns and problems identified:  Since her last visit she has been able to take the [place] and has moved up to the 0.5 mg dose last week  She has not had any nausea or abdominal discomfort with this  She finds that she is able to cut back on her portions and have more satiety with this  With continued walking she has been able to lose 8 pounds  She has not been checking her blood sugars much at all because of family illness and has only a few readings in the morning over the last  month, has had only one unusually high reading of 307 but otherwise they are mostly below 150  Has not checked readings after meals  Side effects from medications have been:?  Victoza caused pancreatitis  Compliance with the medical regimen: Good Hypoglycemia:  None  Glucose monitoring:  done 1-2 times a day         Glucometer:  Verio   Blood Glucose readings by   Mean values apply above for all meters except median for One Touch  PRE-MEAL Fasting Lunch Dinner Bedtime Overall  Glucose range: 107-307      Mean/median:          :Self-care: The diet that the patient has been following is: tries to limit portions.      Typical meal intake: Breakfast is oatmeal/yogurt, lunch is a grilled chicken, evening meal is meat and 2 vegetables, will have snacks on yogurt, peanuts and grapes      Dinner at 6 pm           Dietician visit, most recent:2014               Exercise: some walking upto 60 min    Weight history:   Wt Readings from Last 3 Encounters:  08/18/16 210 lb 3.2 oz (95.3 kg)  07/04/16 218 lb (98.9 kg)  04/15/16 219 lb (99.3 kg)  Glycemic control:   Lab Results  Component Value Date   HGBA1C 8.5 (H) 08/13/2016   HGBA1C 7.8 04/15/2016   HGBA1C 7.4 (H) 12/04/2015   Lab Results  Component Value Date   MICROALBUR 2.6 (H) 12/13/2015   LDLCALC 119 (H) 08/13/2016   CREATININE 0.69 08/13/2016   Lab Results  Component Value Date   MICRALBCREAT 8.7 12/13/2015     Lab on 08/13/2016  Component Date Value Ref Range Status  . Hgb A1c MFr Bld 08/13/2016 8.5* 4.6 - 6.5 % Final   Glycemic Control Guidelines for People with Diabetes:Non Diabetic:  <6%Goal of Therapy: <7%Additional Action Suggested:  >8%   . Sodium 08/13/2016 139  135 - 145 mEq/L Final  . Potassium 08/13/2016 3.6  3.5 - 5.1 mEq/L Final  . Chloride 08/13/2016 102  96 - 112 mEq/L Final  . CO2 08/13/2016 29  19 - 32 mEq/L Final  . Glucose, Bld 08/13/2016 134* 70 - 99 mg/dL Final  . BUN 08/13/2016 11  6 -  23 mg/dL Final  . Creatinine, Ser 08/13/2016 0.69  0.40 - 1.20 mg/dL Final  . Calcium 08/13/2016 9.5  8.4 - 10.5 mg/dL Final  . GFR 08/13/2016 115.37  >60.00 mL/min Final  . Cholesterol 08/13/2016 184  0 - 200 mg/dL Final   ATP III Classification       Desirable:  < 200 mg/dL               Borderline High:  200 - 239 mg/dL          High:  > = 240 mg/dL  . Triglycerides 08/13/2016 112.0  0.0 - 149.0 mg/dL Final   Normal:  <150 mg/dLBorderline High:  150 - 199 mg/dL  . HDL 08/13/2016 41.90  >39.00 mg/dL Final  . VLDL 08/13/2016 22.4  0.0 - 40.0 mg/dL Final  . LDL Cholesterol 08/13/2016 119* 0 - 99 mg/dL Final  . Total CHOL/HDL Ratio 08/13/2016 4   Final                  Men          Women1/2 Average Risk     3.4          3.3Average Risk          5.0          4.42X Average Risk          9.6          7.13X Average Risk          15.0          11.0                      . NonHDL 08/13/2016 141.80   Final   NOTE:  Non-HDL goal should be 30 mg/dL higher than patient's LDL goal (i.e. LDL goal of < 70 mg/dL, would have non-HDL goal of < 100 mg/dL)      Allergies as of 08/18/2016      Reactions   Liraglutide    Other reaction(s): Pancreatitis      Medication List       Accurate as of 08/18/16  9:47 AM. Always use your most recent med list.          amLODipine 5 MG tablet Commonly known as:  NORVASC Take 5 mg by mouth daily.   aspirin EC 81 MG tablet Take 81 mg by mouth every morning.   atorvastatin 10 MG  tablet Commonly known as:  LIPITOR Take 1 tablet (10 mg total) by mouth daily at 6 PM.   canagliflozin 300 MG Tabs tablet Commonly known as:  INVOKANA Take 1 tablet (300 mg total) by mouth daily before breakfast.   cyclobenzaprine 10 MG tablet Commonly known as:  FLEXERIL Take 10 mg by mouth as needed.   diclofenac 75 MG EC tablet Commonly known as:  VOLTAREN TAKE 1 TABLET BY MOUTH TWICE A DAY AFTER MEALS FOR INFLAMMATION/PAIN/SWELLING TAKE AS NEEDED ONLY   dicyclomine 20 MG  tablet Commonly known as:  BENTYL Take 1 tablet (20 mg total) by mouth 2 (two) times daily.   EX-LAX ULTRA 5 MG EC tablet Generic drug:  bisacodyl Take 5 mg by mouth daily as needed for mild constipation or moderate constipation.   gabapentin 300 MG capsule Commonly known as:  NEURONTIN Take 300 mg by mouth at bedtime. Takes 2 tablets at bedtime   glucose blood test strip Commonly known as:  ONETOUCH VERIO Use as instructed to check blood sugar 4 times per day dx code E11.9   GOODY PM PO Take 1 packet by mouth at bedtime as needed. For pain and sleep   meloxicam 7.5 MG tablet Commonly known as:  MOBIC Take 7.5 mg by mouth daily.   metFORMIN 500 MG 24 hr tablet Commonly known as:  GLUCOPHAGE-XR Take 4 tablets (2,000 mg total) by mouth daily with supper.   methocarbamol 500 MG tablet Commonly known as:  ROBAXIN TAKE 1 TABLET BY MOUTH TWICE A DAY AS NEEDED FOR SPASM   multivitamin with minerals tablet Take 1 tablet by mouth every morning. Patient takes Flinstones complete with Vitamin D   NOVOLOG MIX 70/30 FLEXPEN (70-30) 100 UNIT/ML FlexPen Generic drug:  insulin aspart protamine - aspart 18 units in the morning and 16 units at night   ONETOUCH DELICA LANCETS 57X Misc Use to check sugar after meals and at bedtime   ONETOUCH VERIO FLEX SYSTEM w/Device Kit 1 each by Does not apply route 4 (four) times daily.   ramipril 5 MG capsule Commonly known as:  ALTACE Take 1 capsule (5 mg total) by mouth daily.   Semaglutide 0.25 or 0.5 MG/DOSE Sopn Commonly known as:  OZEMPIC Inject 0.25 mg into the skin once a week. After 4 weeks go to 0.69m   traMADol 50 MG tablet Commonly known as:  ULTRAM Take 50 mg by mouth every 12 (twelve) hours as needed.   V-GO 20 Kit Use one per day       Allergies:  Allergies  Allergen Reactions  . Liraglutide     Other reaction(s): Pancreatitis    Past Medical History:  Diagnosis Date  . Diabetes mellitus   . Hyperlipidemia   .  Hypertension     Past Surgical History:  Procedure Laterality Date  . CESAREAN SECTION    . CHOLECYSTECTOMY    . GANGLION CYST EXCISION    . LAPAROSCOPY      Family History  Problem Relation Age of Onset  . Diabetes Mother   . Hypertension Mother   . Diabetes Maternal Aunt   . Diabetes Paternal Aunt   . Diabetes Maternal Grandmother   . Heart disease Neg Hx     Social History:  reports that she quit smoking about 3 years ago. Her smoking use included Cigarettes. She smoked 0.50 packs per day. She has never used smokeless tobacco. She reports that she drinks alcohol. She reports that she does not use drugs.  Review of Systems    Lipid history:  on Lipitor 10 mg, Usually followed by PCP    Lab Results  Component Value Date   CHOL 184 08/13/2016   HDL 41.90 08/13/2016   LDLCALC 119 (H) 08/13/2016   LDLDIRECT 101.0 08/27/2015   TRIG 112.0 08/13/2016   CHOLHDL 4 08/13/2016           Hypertension: Present for about 2 years, followed by PCP, currently on ramipril ?  10 mg and on 25m amlodipine Previously on carvedilol Blood pressure with PCP in February was 142/78  BP Readings from Last 3 Encounters:  08/18/16 140/84  07/04/16 134/84  04/15/16 (!) 160/90    Last eye exam 04/2016  Most recent foot exam: 2/17  Review of Systems    Physical Examination:  BP 140/84   Pulse 99   Ht 5' 2" (1.575 m)   Wt 210 lb 3.2 oz (95.3 kg)   LMP 07/09/2011   SpO2 97%   BMI 38.45 kg/m    ASSESSMENT:  Diabetes type 2, uncontrolled See history of present illness for detailed discussion of current diabetes management, blood sugar patterns and problems identified  A1c is 8.5  This is unusual because of her recent blood sugars looking better and her losing weight as well as doing better with diet and exercise along with starting Ozempic  She may have her delaying improvement of her A1c But also discussed with the patient that she does need to check her readings after  meals which she has not been doing Previously was not able to lose weight even with continuing Invokana  HYPERTENSION: Blood pressure better today   PLAN:    No change in current regimen Increase glucose monitoring Discussed blood sugar targets and she will call if she has consistently abnormal readings  LIPIDS: Forward to PCP, probably needs a higher dose of Lipitor   Patient Instructions  Check blood sugars on waking up  3-4/7  Also check blood sugars about 2 hours after a meal and do this after different meals by rotation  Recommended blood sugar levels on waking up is 90-130 and about 2 hours after meal is 130-160  Please bring your blood sugar monitor to each visit, thank you   Counseling time on subjects discussed above is over 50% of today's 25 minute visit  Quincy Boy 08/18/2016, 9:47 AM   Note: This office note was prepared with [place] agent Any transcriptional errors that result from this process are unintentional.

## 2016-09-24 IMAGING — MG MM DIAGNOSTIC UNILATERAL L
2 series · 2 of 2 positions shown · non-contrast
Comparison: Baseline evaluation 06/14/2014

CLINICAL DATA: Patient recalled from screening for left breast
calcifications.

EXAM:
DIGITAL DIAGNOSTIC LEFT MAMMOGRAM WITH 3D TOMOSYNTHESIS

[L CC]
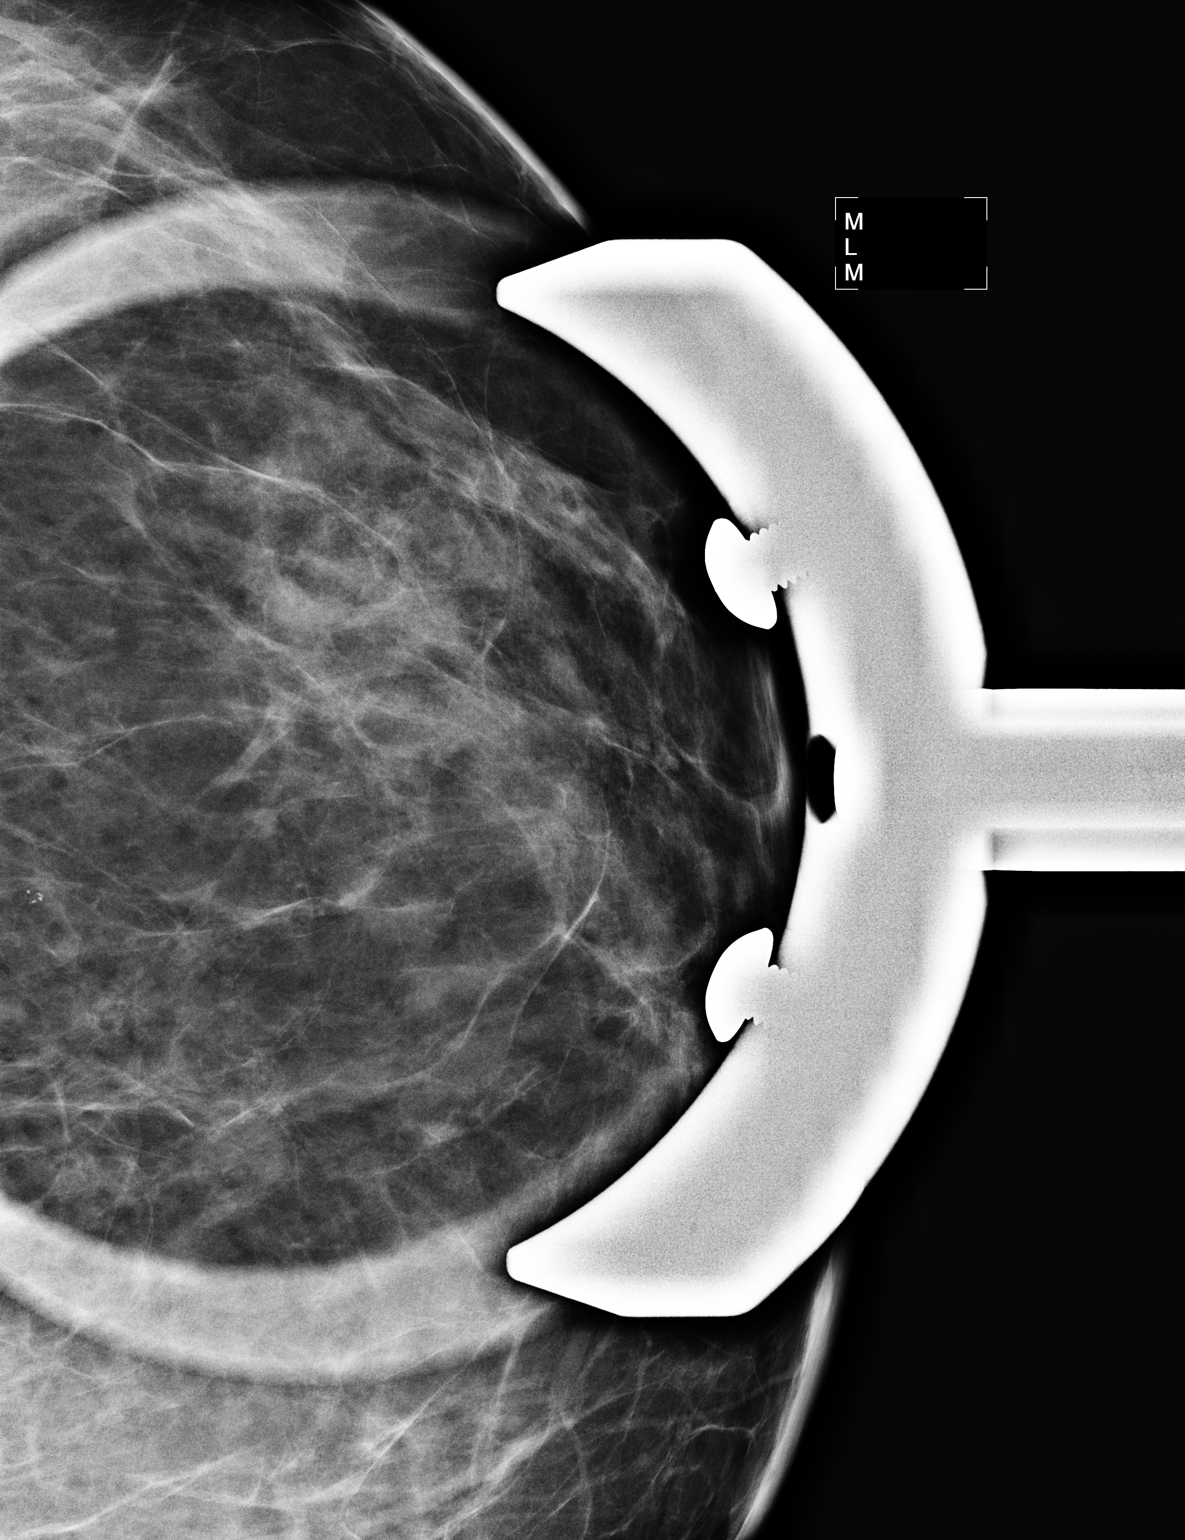

[L ML]
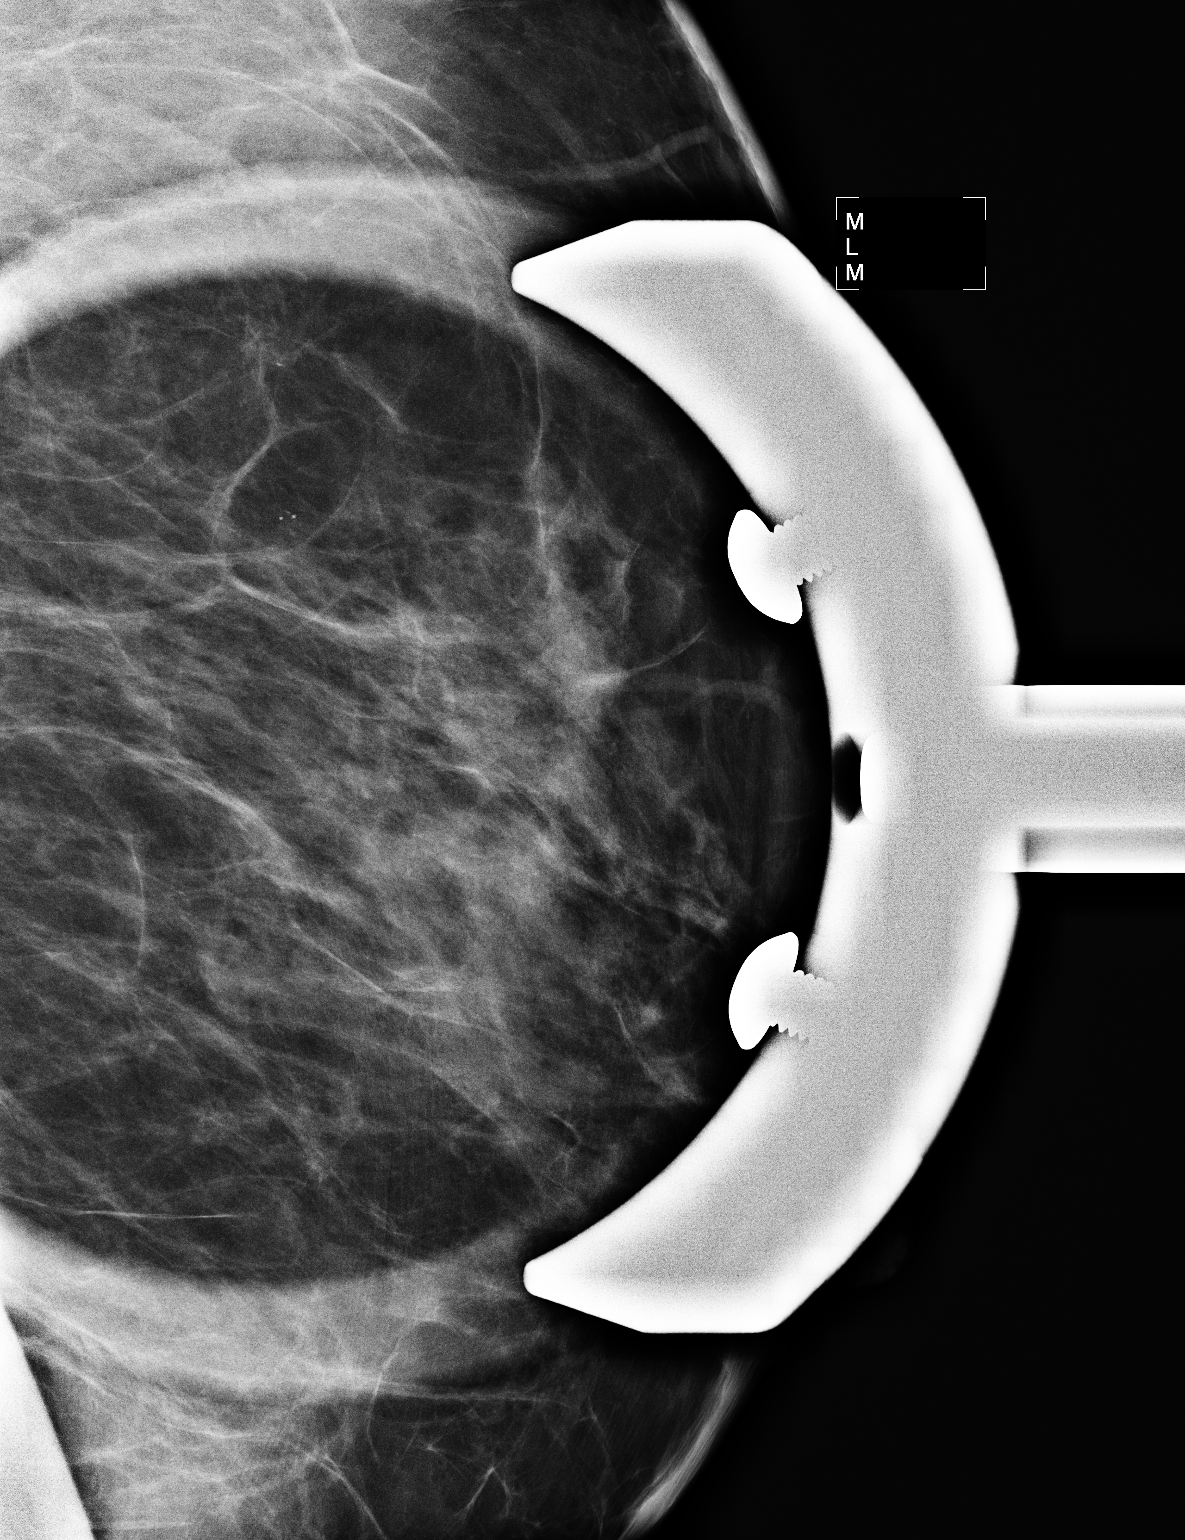

[2 of 2 positions shown; findings below may reference images not displayed]

ACR Breast Density Category b: There are scattered areas of
fibroglandular density.
FINDINGS: Magnification CC and true lateral views of the left breast
demonstrate a 3 mm group of calcifications within the upper-outer
left breast middle depth. Some these calcifications appear linear in
morphology.
IMPRESSION: Suspicious left breast calcifications.

RECOMMENDATION:
Left breast stereotactic guided core needle biopsy.

Patient scheduled for biopsy 07/04/2014 at 9 a.m..

I have discussed the findings and recommendations with the patient.
Results were also provided in writing at the conclusion of the
visit. If applicable, a reminder letter will be sent to the patient
regarding the next appointment.

BI-RADS CATEGORY  4: Suspicious.

## 2016-10-14 ENCOUNTER — Other Ambulatory Visit: Payer: 59

## 2016-10-17 ENCOUNTER — Ambulatory Visit: Payer: 59 | Admitting: Endocrinology

## 2016-10-20 ENCOUNTER — Other Ambulatory Visit: Payer: 59

## 2016-10-21 ENCOUNTER — Other Ambulatory Visit: Payer: 59

## 2016-10-22 ENCOUNTER — Other Ambulatory Visit (INDEPENDENT_AMBULATORY_CARE_PROVIDER_SITE_OTHER): Payer: 59

## 2016-10-22 ENCOUNTER — Ambulatory Visit: Payer: 59 | Admitting: Endocrinology

## 2016-10-22 DIAGNOSIS — Z794 Long term (current) use of insulin: Secondary | ICD-10-CM | POA: Diagnosis not present

## 2016-10-22 DIAGNOSIS — E1165 Type 2 diabetes mellitus with hyperglycemia: Secondary | ICD-10-CM

## 2016-10-22 LAB — GLUCOSE, RANDOM: Glucose, Bld: 88 mg/dL (ref 70–99)

## 2016-10-23 LAB — FRUCTOSAMINE: FRUCTOSAMINE: 284 umol/L (ref 0–285)

## 2016-10-27 ENCOUNTER — Encounter: Payer: Self-pay | Admitting: Endocrinology

## 2016-10-27 ENCOUNTER — Ambulatory Visit (INDEPENDENT_AMBULATORY_CARE_PROVIDER_SITE_OTHER): Payer: 59 | Admitting: Endocrinology

## 2016-10-27 VITALS — BP 128/82 | HR 100 | Ht 62.0 in | Wt 201.8 lb

## 2016-10-27 DIAGNOSIS — E1165 Type 2 diabetes mellitus with hyperglycemia: Secondary | ICD-10-CM | POA: Diagnosis not present

## 2016-10-27 DIAGNOSIS — Z794 Long term (current) use of insulin: Secondary | ICD-10-CM

## 2016-10-27 NOTE — Progress Notes (Signed)
Patient ID: Veronica Fletcher, female   DOB: [birth year], 51 y.o.   MRN: [id number]           Reason for Appointment: Follow-up for Type 2 Diabetes  Referring physician: Harrington Challenger  History of Present Illness:          Date of diagnosis of type 2 diabetes mellitus: 2005       Background history:  She had been started on metformin initially and not clear what other treatment she had taken subsequently, records are being awaited today She apparently was doing very well with a combination of Victoza and metformin until 2015 and she thinks she was losing weight with this.  She had taken Victoza for several months at least However according to his lab records her best A1c in 2014 was only 7.9 Apparently she was taken off Victoza when she was admitted for abdominal pain presumed to be from pancreatitis in 01/2013 She was then started on premixed insulin and taken off metformin also on discharge. She was referred here for poorly controlled diabetes with A1c consistently around 10% On her initial consultation she was given metformin and Jardiance in addition to her insulin   Recent history:   INSULIN regimen is: Novolog Mix at meals: 22-18 Previously on V-go pump 20 unit basal and boluses 4 units  Non-insulin hyperglycemic drugs: Invokana 300 mg daily, metformin ER 1000 mg daily Ozempic 0.5 mg weekly  Her A1c in August was 8.5 Fructosamine previously was high at 344 and now 284   Current management, blood sugar patterns and problems identified:  She has checked her blood sugar very sporadically  Most of her blood sugars are excellent except once on a _0 7   POST-MEAL PC Breakfast PC Lunch PC Dinner  Glucose range: 149    Mean/median:       :Self-care: The diet that the patient has been following is: tries to limit portions.      Typical meal intake: Breakfast is oatmeal/yogurt, lunch is a grilled chicken, evening meal is meat and 2 vegetables, will have snacks on yogurt    Dinner at 6 pm           Dietician visit, most recent:2014               Exercise:  walking upto 60 min  On most days  Weight history:   Wt Readings from Last 3 Encounters:  10/27/16 201 lb 12.8 oz (91.5 kg)  08/18/16 210 lb 3.2 oz (95.3 kg)  07/04/16 218 lb (98.9 kg)  Glycemic control:   Lab Results  Component Value Date   HGBA1C 8.5 (H) 08/13/2016   HGBA1C 7.8 04/15/2016   HGBA1C 7.4 (H) 12/04/2015   Lab Results  Component Value Date   MICROALBUR 2.6 (H) 12/13/2015   LDLCALC 119 (H) 08/13/2016   CREATININE 0.69 08/13/2016   Lab Results  Component Value Date   MICRALBCREAT 8.7 12/13/2015    Lab Results  Component Value Date   FRUCTOSAMINE 284 10/22/2016   FRUCTOSAMINE 344 (H) 07/02/2016   FRUCTOSAMINE 307 (H) 02/21/2016   FRUCTOSAMINE 358 (H) 03/28/2015     Lab on 10/22/2016  Component Date Value Ref Range Status  . Fructosamine 10/22/2016 284  0 - 285 umol/L Final   Comment: Published reference interval for apparently healthy subjects between age 17 and 14 is 8 - 285 umol/L and in a poorly controlled diabetic population is 228 - 563 umol/L with a mean of  396 umol/L.   Marland Kitchen Glucose, Bld 10/22/2016 88  70 - 99 mg/dL Final      Allergies as of 10/27/2016      Reactions   Liraglutide    Other reaction(s): Pancreatitis      Medication List       Accurate as of 10/27/16  3:10 PM. Always use your most recent med list.          amLODipine 5 MG tablet Commonly known as:  NORVASC Take 5 mg by mouth daily.   aspirin EC 81 MG tablet Take 81 mg by mouth every morning.   atorvastatin 10 MG tablet Commonly known as:  LIPITOR Take 1 tablet (10 mg total) by mouth daily at 6 PM.   canagliflozin 300 MG Tabs tablet Commonly known as:  INVOKANA Take 1 tablet (300 mg total) by mouth daily before breakfast.   cyclobenzaprine 10 MG tablet Commonly known as:  FLEXERIL Take 10 mg by mouth as needed.   diclofenac 75 MG EC tablet Commonly known as:  VOLTAREN TAKE 1 TABLET BY MOUTH TWICE A DAY AFTER MEALS FOR INFLAMMATION/PAIN/SWELLING TAKE AS NEEDED ONLY   dicyclomine 20 MG tablet Commonly known as:  BENTYL Take 1 tablet (20 mg total) by mouth 2 (two) times daily.   EX-LAX ULTRA 5 MG EC tablet Generic drug:  bisacodyl Take 5 mg by mouth daily as needed for mild constipation or moderate constipation.   gabapentin 300 MG capsule Commonly known as:  NEURONTIN Take 300 mg by mouth at bedtime. Takes 2 tablets at bedtime   glucose blood test strip Commonly known as:  ONETOUCH VERIO Use as instructed to check blood sugar 4 times per day dx code E11.9   GOODY PM PO Take 1 packet by mouth at bedtime as needed. For pain and sleep   meloxicam 7.5 MG tablet Commonly known as:  MOBIC Take 7.5 mg by mouth daily.   metFORMIN 500 MG 24 hr tablet Commonly known as:  GLUCOPHAGE-XR Take 4 tablets (2,000 mg total) by mouth daily with supper.   methocarbamol 500 MG tablet Commonly known as:  ROBAXIN TAKE 1 TABLET BY MOUTH TWICE A DAY AS NEEDED FOR SPASM   multivitamin with minerals tablet Take 1 tablet by mouth every morning. Patient takes  Flinstones complete with Vitamin D   NOVOLOG MIX 70/30 FLEXPEN (70-30) 100 UNIT/ML FlexPen Generic drug:  insulin aspart protamine - aspart 18 units in the morning and 16 units at night   ONETOUCH DELICA LANCETS 25Z Misc Use to check sugar after meals and at bedtime  [place] w/Device Kit 1 each by Does not apply route 4 (four) times daily.   ramipril 5 MG capsule Commonly known as:  ALTACE Take 1 capsule (5 mg total) by mouth daily.   Semaglutide 0.25 or 0.5 MG/DOSE Sopn Commonly known as:  OZEMPIC Inject 0.25 mg into the skin once a week. After 4 weeks go to 0.26m   traMADol 50 MG tablet Commonly known as:  ULTRAM Take 50 mg by mouth every 12 (twelve) hours as needed.   V-GO 20 Kit Use one per day       Allergies:  Allergies  Allergen Reactions  . Liraglutide     Other reaction(s): Pancreatitis    Past Medical History:  Diagnosis Date  . Diabetes mellitus   . Hyperlipidemia   . Hypertension     Past Surgical History:  Procedure Laterality Date  . CESAREAN SECTION    . CHOLECYSTECTOMY    . GANGLION CYST EXCISION    . LAPAROSCOPY      Family History  Problem Relation Age of Onset  . Diabetes Mother   . Hypertension Mother   . Diabetes Maternal Aunt   . Diabetes Paternal Aunt   . Diabetes Maternal Grandmother   . Heart disease Neg Hx     Social History:  reports that she quit smoking about 3 years ago. Her smoking use included Cigarettes. She smoked 0.50 packs per day. She has never used smokeless tobacco. She reports that she drinks alcohol. She reports that she does not use drugs.    Review of Systems    Lipid history:  on Lipitor 10 mg, Usually followed by PCP    Lab Results  Component Value Date   CHOL 184 08/13/2016   HDL 41.90 08/13/2016   LDLCALC 119 (H) 08/13/2016   LDLDIRECT 101.0 08/27/2015   TRIG 112.0 08/13/2016   CHOLHDL 4 08/13/2016           Hypertension:  followed by PCP, currently on 565mamlodipineAnd 5  mg ramipril   BP Readings from Last 3 Encounters:  10/27/16 128/82  08/18/16 140/84  07/04/16 134/84    Last eye exam 04/2016  Most recent foot exam: 2/17  Review of Systems    Physical Examination:  BP 128/82   Pulse 100   Ht 5' 2" (1.575 m)   Wt 201 lb 12.8 oz (91.5 kg)   LMP 07/09/2011   SpO2 98%   BMI 36.91 kg/m    ASSESSMENT:  Diabetes type 2, uncontrolled See history of present illness for detailed discussion of current diabetes management, blood sugar patterns and problems identified  Her fructosamine is 284 indicating good control She is doing very well with Ozempic and is tolerating this well However not checking blood sugars much Her lifestyle is significantly improved and she is losing weight Currently not checking blood sugars enough to help adjust her insulin doses but most likely needs a little less insulin in the morning that she has a couple of readings in the 70s and 80s during the day including in the lab   HYPERTENSION: Blood pressure Controlled today   PLAN:   She will reduce her morning doses by 2 units  Ozempic and Invokana, continue unchanged  Increase glucose monitoring frequency at various times   LIPIDS:  last LDL was high and she may have her PCP repeat the labs next month   Patient Instructions  Reduce to 20 units in am     [facility]/2018, 3:10  PM   Note: This office note was prepared with Estate agent. Any transcriptional errors that result from this process are unintentional.

## 2016-10-27 NOTE — Patient Instructions (Signed)
Reduce to 20 units in am

## 2016-12-02 ENCOUNTER — Other Ambulatory Visit: Payer: Self-pay | Admitting: Endocrinology

## 2017-01-23 ENCOUNTER — Other Ambulatory Visit (INDEPENDENT_AMBULATORY_CARE_PROVIDER_SITE_OTHER): Payer: 59

## 2017-01-23 DIAGNOSIS — E1165 Type 2 diabetes mellitus with hyperglycemia: Secondary | ICD-10-CM | POA: Diagnosis not present

## 2017-01-23 DIAGNOSIS — Z794 Long term (current) use of insulin: Secondary | ICD-10-CM

## 2017-01-23 LAB — COMPREHENSIVE METABOLIC PANEL
ALBUMIN: 4.3 g/dL (ref 3.5–5.2)
ALT: 12 U/L (ref 0–35)
AST: 16 U/L (ref 0–37)
Alkaline Phosphatase: 71 U/L (ref 39–117)
BUN: 16 mg/dL (ref 6–23)
CHLORIDE: 104 meq/L (ref 96–112)
CO2: 26 meq/L (ref 19–32)
CREATININE: 0.78 mg/dL (ref 0.40–1.20)
Calcium: 9.2 mg/dL (ref 8.4–10.5)
GFR: 99.97 mL/min (ref >60.00)
Glucose, Bld: 92 mg/dL (ref 70–99)
Potassium: 3.4 mEq/L — ABNORMAL LOW (ref 3.5–5.1)
Sodium: 137 mEq/L (ref 135–145)
Total Bilirubin: 0.5 mg/dL (ref 0.2–1.2)
Total Protein: 7.8 g/dL (ref 6.0–8.3)

## 2017-01-23 LAB — LIPID PANEL
CHOL/HDL RATIO: 4
CHOLESTEROL: 189 mg/dL (ref 0–200)
HDL: 47.4 mg/dL (ref >39.00)
LDL CALC: 124 mg/dL — AB (ref 0–99)
NonHDL: 141.54
Triglycerides: 87 mg/dL (ref 0.0–149.0)
VLDL: 17.4 mg/dL (ref 0.0–40.0)

## 2017-01-23 LAB — HEMOGLOBIN A1C: Hgb A1c MFr Bld: 7.8 % — ABNORMAL HIGH (ref 4.6–6.5)

## 2017-01-27 ENCOUNTER — Ambulatory Visit: Payer: 59 | Admitting: Endocrinology

## 2017-02-04 ENCOUNTER — Ambulatory Visit: Payer: 59 | Admitting: Endocrinology

## 2017-02-27 ENCOUNTER — Other Ambulatory Visit: Payer: Self-pay | Admitting: Endocrinology

## 2017-03-03 ENCOUNTER — Telehealth: Payer: Self-pay

## 2017-03-03 NOTE — Telephone Encounter (Signed)
Received PA for Invokana, called pt and she is aware to contact her insurance company to find the covered alternative.

## 2017-03-18 ENCOUNTER — Ambulatory Visit (INDEPENDENT_AMBULATORY_CARE_PROVIDER_SITE_OTHER): Payer: 59 | Admitting: Endocrinology

## 2017-03-18 ENCOUNTER — Encounter: Payer: Self-pay | Admitting: Endocrinology

## 2017-03-18 VITALS — BP 130/86 | HR 96 | Ht 62.0 in | Wt 181.6 lb

## 2017-03-18 DIAGNOSIS — E1165 Type 2 diabetes mellitus with hyperglycemia: Secondary | ICD-10-CM

## 2017-03-18 DIAGNOSIS — Z794 Long term (current) use of insulin: Secondary | ICD-10-CM

## 2017-03-18 NOTE — Progress Notes (Signed)
Patient ID: Veronica Fletcher, female   DOB: May 21, 1965, 52 y.o.   MRN: [id number]           Reason for Appointment: Follow-up for Type 2 Diabetes  Referring physician: Harrington Challenger  History of Present Illness:          Date of diagnosis of type 2 diabetes mellitus: 2005       Background history:  She had been started on metformin initially and not clear what other treatment she had taken subsequently, records are being awaited today She apparently was doing very well with a combination of Victoza and metformin until 2015 and she thinks she was losing weight with this.  She had taken Victoza for several months at least However according to his lab records her best A1c in 2014 was only 7.9 Apparently she was taken off Victoza when she was admitted for abdominal pain presumed to be from pancreatitis in 01/2013 She was then started on premixed insulin and taken off metformin also on discharge. She was referred here for poorly controlled diabetes with A1c consistently around 10% On her initial consultation she was given metformin and Jardiance in addition to her insulin  V-go pump 20 unit basal and boluses 4 units previously stopped  Recent history:   INSULIN regimen is: Novolog Mix at meals: 20-18    Non-insulin hyperglycemic drugs: Invokana 300 mg daily, metformin ER 1000 mg daily Ozempic 0.5 mg weekly  Her A1c in January was 7.8, previously in August was 8.5 Fructosamine previously was 284 in October   Current management, blood sugar patterns and problems identified:  She did not bring her monitor today  She thinks that her blood sugars are excellent although not clear when and how often she is checking  Since she was having low normal readings midday she was told to reduce her morning dose to 2 units in the last visit  No hypoglycemia now  This is despite losing about 20 pounds since last October  She is able to control her portions and carbohydrates, continues to benefit from [place]  which is not causing any nausea  Continues to be walking up to 45 minutes a day even while working  She takes her insulin consistently before meals  Side effects from medications have been:?  Victoza caused pancreatitis  Compliance with the medical regimen: Good Hypoglycemia:  None  Glucose monitoring:  done 0.3 times a day         Glucometer:  Verio   Blood Glucose readings by  Recall:85-150  :Self-care: The diet that the patient has been following is: tries to limit portions.      Typical meal intake: Breakfast is oatmeal/yogurt, lunch is a grilled chicken, evening meal is meat and 2 vegetables, will have snacks on yogurt    Dinner at 6 pm           Dietician visit, most recent:2014               Exercise:  walking upto 45 min. in 2 sitings  Weight history:   Wt Readings from Last 3 Encounters:  03/18/17 181 lb 9.6 oz (82.4 kg)  10/27/16 201 lb 12.8 oz (91.5 kg)  08/18/16 210 lb 3.2 oz (95.3 kg)    Glycemic control:   Lab Results  Component Value Date   HGBA1C 7.8 (H) 01/23/2017   HGBA1C 8.5 (H) 08/13/2016   HGBA1C 7.8 04/15/2016   Lab Results  Component Value Date   MICROALBUR 2.6 (H) 12/13/2015  LDLCALC 124 (H) 01/23/2017   CREATININE 0.78 01/23/2017   Lab Results  Component Value Date   MICRALBCREAT 8.7 12/13/2015    Lab Results  Component Value Date   FRUCTOSAMINE 284 10/22/2016   FRUCTOSAMINE 344 (H) 07/02/2016   FRUCTOSAMINE 307 (H) 02/21/2016   FRUCTOSAMINE 358 (H) 03/28/2015     No visits with results within 1 Week(s) from this visit.  Latest known visit with results is:  Lab on 01/23/2017  Component Date Value Ref Range Status  . Cholesterol 01/23/2017 189  0 - 200 mg/dL Final   ATP III Classification       Desirable:  < 200 mg/dL               Borderline High:  200 - 239 mg/dL          High:  > = 240 mg/dL  . Triglycerides 01/23/2017 87.0  0.0 - 149.0 mg/dL Final   Normal:  <150 mg/dLBorderline High:  150 - 199 mg/dL  . HDL 01/23/2017  47.40  >39.00 mg/dL Final  . VLDL 01/23/2017 17.4  0.0 - 40.0 mg/dL Final  . LDL Cholesterol 01/23/2017 124* 0 - 99 mg/dL Final  . Total CHOL/HDL Ratio 01/23/2017 4   Final                  Men          Women1/2 Average Risk     3.4          3.3Average Risk          5.0          4.42X Average Risk          9.6          7.13X Average Risk          15.0          11.0                      . NonHDL 01/23/2017 141.54   Final   NOTE:  Non-HDL goal should be 30 mg/dL higher than patient's LDL goal (i.e. LDL goal of < 70 mg/dL, would have non-HDL goal of < 100 mg/dL)  . Sodium 01/23/2017 137  135 - 145 mEq/L Final  . Potassium 01/23/2017 3.4* 3.5 - 5.1 mEq/L Final  . Chloride 01/23/2017 104  96 - 112 mEq/L Final  . CO2 01/23/2017 26  19 - 32 mEq/L Final  . Glucose, Bld 01/23/2017 92  70 - 99 mg/dL Final  . BUN 01/23/2017 16  6 - 23 mg/dL Final  . Creatinine, Ser 01/23/2017 0.78  0.40 - 1.20 mg/dL Final  . Total Bilirubin 01/23/2017 0.5  0.2 - 1.2 mg/dL Final  . Alkaline Phosphatase 01/23/2017 71  39 - 117 U/L Final  . AST 01/23/2017 16  0 - 37 U/L Final  . ALT 01/23/2017 12  0 - 35 U/L Final  . Total Protein 01/23/2017 7.8  6.0 - 8.3 g/dL Final  . Albumin 01/23/2017 4.3  3.5 - 5.2 g/dL Final  . Calcium 01/23/2017 9.2  8.4 - 10.5 mg/dL Final  . GFR 01/23/2017 99.97  >60.00 mL/min Final  . Hgb A1c MFr Bld 01/23/2017 7.8* 4.6 - 6.5 % Final   Glycemic Control Guidelines for People with Diabetes:Non Diabetic:  <6%Goal of Therapy: <7%Additional Action Suggested:  >8%       Allergies as of 03/18/2017      Reactions   Liraglutide  Other reaction(s): Pancreatitis      Medication List        Accurate as of 03/18/17  9:15 AM. Always use your most recent med list.          amLODipine 5 MG tablet Commonly known as:  NORVASC Take 5 mg by mouth daily.   aspirin EC 81 MG tablet Take 81 mg by mouth every morning.   atorvastatin 10 MG tablet Commonly known as:  LIPITOR Take 1 tablet (10 mg  total) by mouth daily at 6 PM.   cyclobenzaprine 10 MG tablet Commonly known as:  FLEXERIL Take 10 mg by mouth as needed.   diclofenac 75 MG EC tablet Commonly known as:  VOLTAREN TAKE 1 TABLET BY MOUTH TWICE A DAY AFTER MEALS FOR INFLAMMATION/PAIN/SWELLING TAKE AS NEEDED ONLY   dicyclomine 20 MG tablet Commonly known as:  BENTYL Take 1 tablet (20 mg total) by mouth 2 (two) times daily.   EX-LAX ULTRA 5 MG EC tablet Generic drug:  bisacodyl Take 5 mg by mouth daily as needed for mild constipation or moderate constipation.   gabapentin 300 MG capsule Commonly known as:  NEURONTIN Take 300 mg by mouth at bedtime. Takes 2 tablets at bedtime   glucose blood test strip Commonly known as:  ONETOUCH VERIO Use as instructed to check blood sugar 4 times per day dx code E11.9   GOODY PM PO Take 1 packet by mouth at bedtime as needed. For pain and sleep   INVOKANA 300 MG Tabs tablet Generic drug:  canagliflozin TAKE 1 TABLET BY MOUTH DAILY BEFORE BREAKFAST.   meloxicam 7.5 MG tablet Commonly known as:  MOBIC Take 7.5 mg by mouth daily.   metFORMIN 500 MG 24 hr tablet Commonly known as:  GLUCOPHAGE-XR Take 4 tablets (2,000 mg total) by mouth daily with supper.   methocarbamol 500 MG tablet Commonly known as:  ROBAXIN TAKE 1 TABLET BY MOUTH TWICE A DAY AS NEEDED FOR SPASM   multivitamin with minerals tablet Take 1 tablet by mouth every morning. Patient takes Flinstones complete with Vitamin D   NOVOLOG MIX 70/30 FLEXPEN (70-30) 100 UNIT/ML FlexPen Generic drug:  insulin aspart protamine - aspart 18 units in the morning and 16 units at night   ONETOUCH DELICA LANCETS 58K Misc Use to check sugar after meals and at bedtime   ONETOUCH VERIO FLEX SYSTEM w/Device Kit 1 each by Does not apply route 4 (four) times daily.   ramipril 5 MG capsule Commonly known as:  ALTACE Take 1 capsule (5 mg total) by mouth daily.   Semaglutide 0.25 or 0.5 MG/DOSE Sopn Commonly known as:   OZEMPIC Inject 0.25 mg into the skin once a week. After 4 weeks go to 0.33m   traMADol 50 MG tablet Commonly known as:  ULTRAM Take 50 mg by mouth every 12 (twelve) hours as needed.   V-GO 20 Kit Use one per day       Allergies:  Allergies  Allergen Reactions  . Liraglutide     Other reaction(s): Pancreatitis    Past Medical History:  Diagnosis Date  . Diabetes mellitus   . Hyperlipidemia   . Hypertension     Past Surgical History:  Procedure Laterality Date  . CESAREAN SECTION    . CHOLECYSTECTOMY    . GANGLION CYST EXCISION    . LAPAROSCOPY      Family History  Problem Relation Age of Onset  . Diabetes Mother   . Hypertension Mother   .  Diabetes Maternal Aunt   . Diabetes Paternal Aunt   . Diabetes Maternal Grandmother   . Heart disease Neg Hx     Social History:  reports that she quit smoking about 4 years ago. Her smoking use included cigarettes. She smoked 0.50 packs per day. she has never used smokeless tobacco. She reports that she drinks alcohol. She reports that she does not use drugs.    Review of Systems    Lipid history:  on Lipitor 10 mg, Usually followed by PCP    Lab Results  Component Value Date   CHOL 189 01/23/2017   HDL 47.40 01/23/2017   LDLCALC 124 (H) 01/23/2017   LDLDIRECT 101.0 08/27/2015   TRIG 87.0 01/23/2017   CHOLHDL 4 01/23/2017           Hypertension:  followed by PCP, currently on 70m amlodipine and 5 mg ramipril   BP Readings from Last 3 Encounters:  03/18/17 130/86  10/27/16 128/82  08/18/16 140/84    Last eye exam 04/2016  Most recent foot exam: March 2019  Review of Systems  No muscle cramps  Physical Examination:  BP 130/86 (BP Location: Left Arm, Patient Position: Sitting, Cuff Size: Normal)   Pulse 96   Ht 5' 2"  (1.575 m)   Wt 181 lb 9.6 oz (82.4 kg)   LMP 07/09/2011   SpO2 99%   BMI 33.22 kg/m   Diabetic Foot Exam - Simple   Simple Foot Form Diabetic Foot exam was performed with the  following findings:  Yes 03/18/2017  9:12 AM  Visual Inspection No deformities, no ulcerations, no other skin breakdown bilaterally:  Yes Sensation Testing Intact to touch and monofilament testing bilaterally:  Yes Pulse Check Posterior Tibialis and Dorsalis pulse intact bilaterally:  Yes Comments     ASSESSMENT:  Diabetes type 2, insulin-dependent See history of present illness for detailed discussion of current diabetes management, blood sugar patterns and problems identified  Her A1c continues to be high at 7.8 although this is almost 2 months old Fructosamine previously was  284 previously indicating good control and A1c may be falsely high Usually does not check blood sugars very often and not clear if she may be having high postprandial readings Did not bring her monitor  He has done very well with aggressive weight loss However does not appear to be requiring less insulin even with losing weight and trying to be very active with walking  She is doing very well with Ozempic also    HYPERTENSION: Blood pressure relatively higher but was better in January, she is stressed today    PLAN:   No change in medications or insulin as needed More consistent monitoring after meals  Follow-up in 3 months  LIPIDS:  last LDL was high and she will discuss with PCP Likely needs a higher dose of Lipitor    There are no Patient Instructions on file for this visit.    AElayne Snare3/06/2017, 9:15 AM   Note: This office note was prepared with Dragon voice recognition system technology. Any transcriptional errors that result from this process are unintentional.

## 2017-06-15 ENCOUNTER — Other Ambulatory Visit: Payer: 59

## 2017-06-16 ENCOUNTER — Other Ambulatory Visit (INDEPENDENT_AMBULATORY_CARE_PROVIDER_SITE_OTHER): Payer: 59

## 2017-06-16 ENCOUNTER — Telehealth: Payer: Self-pay

## 2017-06-16 DIAGNOSIS — E1165 Type 2 diabetes mellitus with hyperglycemia: Secondary | ICD-10-CM | POA: Diagnosis not present

## 2017-06-16 DIAGNOSIS — Z794 Long term (current) use of insulin: Secondary | ICD-10-CM | POA: Diagnosis not present

## 2017-06-16 LAB — COMPREHENSIVE METABOLIC PANEL
ALT: 11 U/L (ref 0–35)
AST: 14 U/L (ref 0–37)
Albumin: 4.5 g/dL (ref 3.5–5.2)
Alkaline Phosphatase: 82 U/L (ref 39–117)
BILIRUBIN TOTAL: 0.6 mg/dL (ref 0.2–1.2)
BUN: 9 mg/dL (ref 6–23)
CO2: 30 meq/L (ref 19–32)
Calcium: 9.5 mg/dL (ref 8.4–10.5)
Chloride: 103 mEq/L (ref 96–112)
Creatinine, Ser: 0.69 mg/dL (ref 0.40–1.20)
GFR: 114.99 mL/min (ref >60.00)
Glucose, Bld: 46 mg/dL — CL (ref 70–99)
Potassium: 3.5 mEq/L (ref 3.5–5.1)
Sodium: 142 mEq/L (ref 135–145)
Total Protein: 7.8 g/dL (ref 6.0–8.3)

## 2017-06-16 LAB — LIPID PANEL
CHOL/HDL RATIO: 4
Cholesterol: 198 mg/dL (ref 0–200)
HDL: 50.2 mg/dL (ref >39.00)
LDL Cholesterol: 130 mg/dL — ABNORMAL HIGH (ref 0–99)
NonHDL: 147.43
Triglycerides: 88 mg/dL (ref 0.0–149.0)
VLDL: 17.6 mg/dL (ref 0.0–40.0)

## 2017-06-16 LAB — MICROALBUMIN / CREATININE URINE RATIO
CREATININE, U: 156.7 mg/dL
MICROALB UR: 1.9 mg/dL (ref 0.0–1.9)
Microalb Creat Ratio: 1.2 mg/g (ref 0.0–30.0)

## 2017-06-16 LAB — HEMOGLOBIN A1C: Hgb A1c MFr Bld: 6.9 % — ABNORMAL HIGH (ref 4.6–6.5)

## 2017-06-16 NOTE — Telephone Encounter (Signed)
Pt was called and left detailed voicemail. Pt given MD message and urged to call with any questions.

## 2017-06-16 NOTE — Telephone Encounter (Signed)
Main lab just called with a critical glucose of 46.

## 2017-06-16 NOTE — Telephone Encounter (Signed)
Please check with the patient if she has had any significant symptoms and remind her to bring her monitor with frequent testing over the next 2 days

## 2017-06-18 ENCOUNTER — Encounter: Payer: Self-pay | Admitting: Endocrinology

## 2017-06-18 ENCOUNTER — Ambulatory Visit (INDEPENDENT_AMBULATORY_CARE_PROVIDER_SITE_OTHER): Payer: 59 | Admitting: Endocrinology

## 2017-06-18 VITALS — BP 144/88 | HR 93 | Ht 62.0 in | Wt 191.4 lb

## 2017-06-18 DIAGNOSIS — Z794 Long term (current) use of insulin: Secondary | ICD-10-CM

## 2017-06-18 DIAGNOSIS — E1165 Type 2 diabetes mellitus with hyperglycemia: Secondary | ICD-10-CM

## 2017-06-18 DIAGNOSIS — I10 Essential (primary) hypertension: Secondary | ICD-10-CM

## 2017-06-18 DIAGNOSIS — E7800 Pure hypercholesterolemia, unspecified: Secondary | ICD-10-CM | POA: Diagnosis not present

## 2017-06-18 LAB — FRUCTOSAMINE: FRUCTOSAMINE: 290 umol/L — AB (ref 0–285)

## 2017-06-18 MED ORDER — DAPAGLIFLOZIN PROPANEDIOL 5 MG PO TABS: 5.0000 mg | ORAL_TABLET | Freq: Every day | ORAL | 2 refills | Status: DC

## 2017-06-18 NOTE — Patient Instructions (Addendum)
Check blood sugars on waking up 2-3/7  Also check blood sugars about 2 hours after a meal and do this after different meals by rotation  Recommended blood sugar levels on waking up is 90-130 and about 2 hours after meal is 130-160  Please bring your blood sugar monitor to each visit, thank you  Call if lunch and dinner sugars stays >180

## 2017-06-18 NOTE — Progress Notes (Signed)
Patient ID: Veronica Fletcher, female   DOB: Apr 22, 1965, 52 y.o.   MRN: [id number]           Reason for Appointment: Follow-up for Type 2 Diabetes  Referring physician: Harrington Challenger  History of Present Illness:          Date of diagnosis of type 2 diabetes mellitus: 2005       Background history:  She had been started on metformin initially and not clear what other treatment she had taken subsequently, records are being awaited today She apparently was doing very well with a combination of Victoza and metformin until 2015 and she thinks she was losing weight with this.  She had taken Victoza for several months at least However according to his lab records her best A1c in 2014 was only 7.9 Apparently she was taken off Victoza when she was admitted for abdominal pain presumed to be from pancreatitis in 01/2013 She was then started on premixed insulin and taken off metformin also on discharge. She was referred here for poorly controlled diabetes with A1c consistently around 10% On her initial consultation she was given metformin and Jardiance in addition to her insulin  V-go pump 20 unit basal and boluses 4 units previously stopped  Recent history:   INSULIN regimen is: Novolog Mix at meals: 20-18   Non-insulin hyperglycemic drugs: / Invokana 300 mg daily, metformin ER 1000 mg daily Ozempic 0.5 mg weekly  Her A1c is much better at 6.9, previously 7.8 However fructosamine is high at 290   Current management, blood sugar patterns and problems identified:  She did bring her monitor today, did not have it last time  She has variable blood sugars but earlier was having problems with dental issues and was not able to eat much causing blood sugars will be low normal and once woke up with a 63 blood sugar  More recently however her blood sugars are trending higher and also high the last 2 afternoons  However has only 3 readings in the afternoon  Blood sugars are relatively better after supper but  not taking recently  Apparently her Invokana was not covered by her insurance and appears that she did not call for replacement  She is trying to take her insulin consistently  However does not understand that this needs to be taken 15 to 30 minutes before eating and not longer; when she had a lab work she was fasting for 2 hours after her insulin dose and her blood sugar dropped to 46 without much symptoms  Because of family issues she has not been able to do her walking, previously was walking up to 45 minutes almost daily  Her weight has gone up 10 pounds  Side effects from medications have been:?  Victoza caused pancreatitis  Compliance with the medical regimen: Good Hypoglycemia:     Glucose monitoring:  done 0.3 times a day         Glucometer:  Verio   Blood Glucose readings by download  Mean values apply above for all meters except median for One Touch  PRE-MEAL Fasting Lunch Dinner Bedtime Overall  Glucose range:  63-276  124-203    Mean/median:  138  74  190   120+/-58   POST-MEAL PC Breakfast PC Lunch PC Dinner  Glucose range:    79-130  Mean/median:          :Self-care: The diet that the patient has been following is: tries to limit portions.  Typical meal intake: Breakfast is oatmeal/yogurt, lunch is a grilled chicken, evening meal is meat and 2 vegetables, will have snacks on yogurt    Dinner at 6 pm           Dietician visit, most recent:2014               Exercise:  walking upto 25 min. in 2 sitings  Weight history:   Wt Readings from Last 3 Encounters:  06/18/17 191 lb 6.4 oz (86.8 kg)  03/18/17 181 lb 9.6 oz (82.4 kg)  10/27/16 201 lb 12.8 oz (91.5 kg)    Glycemic control:   Lab Results  Component Value Date   HGBA1C 6.9 (H) 06/16/2017   HGBA1C 7.8 (H) 01/23/2017   HGBA1C 8.5 (H) 08/13/2016   Lab Results  Component Value Date   MICROALBUR 1.9 06/16/2017   LDLCALC 130 (H) 06/16/2017   CREATININE 0.69 06/16/2017   Lab Results    Component Value Date   MICRALBCREAT 1.2 06/16/2017    Lab Results  Component Value Date   FRUCTOSAMINE 290 (H) 06/16/2017   FRUCTOSAMINE 284 10/22/2016   FRUCTOSAMINE 344 (H) 07/02/2016   FRUCTOSAMINE 307 (H) 02/21/2016     Lab on 06/16/2017  Component Date Value Ref Range Status  . Fructosamine 06/16/2017 290* 0 - 285 umol/L Final   Comment: Published reference interval for apparently healthy subjects between age 96 and 5 is 66 - 285 umol/L and in a poorly controlled diabetic population is 228 - 563 umol/L with a mean of 396 umol/L.   Marland Kitchen Microalb, Ur 06/16/2017 1.9  0.0 - 1.9 mg/dL Final  . Creatinine,U 06/16/2017 156.7  mg/dL Final  . Microalb Creat Ratio 06/16/2017 1.2  0.0 - 30.0 mg/g Final  . Cholesterol 06/16/2017 198  0 - 200 mg/dL Final   ATP III Classification       Desirable:  < 200 mg/dL               Borderline High:  200 - 239 mg/dL          High:  > = 240 mg/dL  . Triglycerides 06/16/2017 88.0  0.0 - 149.0 mg/dL Final   Normal:  <150 mg/dLBorderline High:  150 - 199 mg/dL  . HDL 06/16/2017 50.20  >39.00 mg/dL Final  . VLDL 06/16/2017 17.6  0.0 - 40.0 mg/dL Final  . LDL Cholesterol 06/16/2017 130* 0 - 99 mg/dL Final  . Total CHOL/HDL Ratio 06/16/2017 4   Final                  Men          Women1/2 Average Risk     3.4          3.3Average Risk          5.0          4.42X Average Risk          9.6          7.13X Average Risk          15.0          11.0                      . NonHDL 06/16/2017 147.43   Final   NOTE:  Non-HDL goal should be 30 mg/dL higher than patient's LDL goal (i.e. LDL goal of < 70 mg/dL, would have non-HDL goal of < 100 mg/dL)  . Sodium 06/16/2017 142  135 - 145 mEq/L Final  . Potassium 06/16/2017 3.5  3.5 - 5.1 mEq/L Final  . Chloride 06/16/2017 103  96 - 112 mEq/L Final  . CO2 06/16/2017 30  19 - 32 mEq/L Final  . Glucose, Bld 06/16/2017 46* 70 - 99 mg/dL Final  . BUN 06/16/2017 9  6 - 23 mg/dL Final  . Creatinine, Ser 06/16/2017 0.69   0.40 - 1.20 mg/dL Final  . Total Bilirubin 06/16/2017 0.6  0.2 - 1.2 mg/dL Final  . Alkaline Phosphatase 06/16/2017 82  39 - 117 U/L Final  . AST 06/16/2017 14  0 - 37 U/L Final  . ALT 06/16/2017 11  0 - 35 U/L Final  . Total Protein 06/16/2017 7.8  6.0 - 8.3 g/dL Final  . Albumin 06/16/2017 4.5  3.5 - 5.2 g/dL Final  . Calcium 06/16/2017 9.5  8.4 - 10.5 mg/dL Final  . GFR 06/16/2017 114.99  >60.00 mL/min Final  . Hgb A1c MFr Bld 06/16/2017 6.9* 4.6 - 6.5 % Final   Glycemic Control Guidelines for People with Diabetes:Non Diabetic:  <6%Goal of Therapy: <7%Additional Action Suggested:  >8%       Allergies as of 06/18/2017      Reactions   Liraglutide    Other reaction(s): Pancreatitis      Medication List        Accurate as of 06/18/17 10:47 AM. Always use your most recent med list.          amLODipine 5 MG tablet Commonly known as:  NORVASC Take 5 mg by mouth daily.   aspirin EC 81 MG tablet Take 81 mg by mouth every morning.   atorvastatin 10 MG tablet Commonly known as:  LIPITOR Take 1 tablet (10 mg total) by mouth daily at 6 PM.   cyclobenzaprine 10 MG tablet Commonly known as:  FLEXERIL Take 10 mg by mouth as needed.   dapagliflozin propanediol 5 MG Tabs tablet Commonly known as:  FARXIGA Take 5 mg by mouth daily.   diclofenac 75 MG EC tablet Commonly known as:  VOLTAREN TAKE 1 TABLET BY MOUTH TWICE A DAY AFTER MEALS FOR INFLAMMATION/PAIN/SWELLING TAKE AS NEEDED ONLY   dicyclomine 20 MG tablet Commonly known as:  BENTYL Take 1 tablet (20 mg total) by mouth 2 (two) times daily.   EX-LAX ULTRA 5 MG EC tablet Generic drug:  bisacodyl Take 5 mg by mouth daily as needed for mild constipation or moderate constipation.   gabapentin 300 MG capsule Commonly known as:  NEURONTIN Take 300 mg by mouth at bedtime. Takes 2 tablets at bedtime   glucose blood test strip Commonly known as:  ONETOUCH VERIO Use as instructed to check blood sugar 4 times per day dx code  E11.9   GOODY PM PO Take 1 packet by mouth at bedtime as needed. For pain and sleep   meloxicam 7.5 MG tablet Commonly known as:  MOBIC Take 7.5 mg by mouth daily.   metFORMIN 500 MG 24 hr tablet Commonly known as:  GLUCOPHAGE-XR Take 4 tablets (2,000 mg total) by mouth daily with supper.   methocarbamol 500 MG tablet Commonly known as:  ROBAXIN TAKE 1 TABLET BY MOUTH TWICE A DAY AS NEEDED FOR SPASM   multivitamin with minerals tablet Take 1 tablet by mouth every morning. Patient takes Flinstones complete with Vitamin D   NOVOLOG MIX 70/30 FLEXPEN (70-30) 100 UNIT/ML FlexPen Generic drug:  insulin aspart protamine - aspart 18 units in the morning and 16 units at  night   ONETOUCH DELICA LANCETS 58N Misc Use to check sugar after meals and at bedtime   ONETOUCH VERIO FLEX SYSTEM w/Device Kit 1 each by Does not apply route 4 (four) times daily.   ramipril 5 MG capsule Commonly known as:  ALTACE Take 1 capsule (5 mg total) by mouth daily.   Semaglutide 0.25 or 0.5 MG/DOSE Sopn Commonly known as:  OZEMPIC Inject 0.25 mg into the skin once a week. After 4 weeks go to 0.84m   traMADol 50 MG tablet Commonly known as:  ULTRAM Take 50 mg by mouth every 12 (twelve) hours as needed.       Allergies:  Allergies  Allergen Reactions  . Liraglutide     Other reaction(s): Pancreatitis    Past Medical History:  Diagnosis Date  . Diabetes mellitus   . Hyperlipidemia   . Hypertension     Past Surgical History:  Procedure Laterality Date  . CESAREAN SECTION    . CHOLECYSTECTOMY    . GANGLION CYST EXCISION    . LAPAROSCOPY      Family History  Problem Relation Age of Onset  . Diabetes Mother   . Hypertension Mother   . Diabetes Maternal Aunt   . Diabetes Paternal Aunt   . Diabetes Maternal Grandmother   . Heart disease Neg Hx     Social History:  reports that she quit smoking about 4 years ago. Her smoking use included cigarettes. She smoked 0.50 packs per day.  She has never used smokeless tobacco. She reports that she drinks alcohol. She reports that she does not use drugs.    Review of Systems    Lipid history: was on Lipitor 10 mg, Usually followed by PCP She says she is not taking this and does not like to take medications, she thinks she would like to control cholesterol with other natural ways    Lab Results  Component Value Date   CHOL 198 06/16/2017   HDL 50.20 06/16/2017   LDLCALC 130 (H) 06/16/2017   LDLDIRECT 101.0 08/27/2015   TRIG 88.0 06/16/2017   CHOLHDL 4 06/16/2017           Hypertension:  followed by PCP, currently on 581mamlodipine and 5 mg ramipril Blood pressure was 152 with PCP, no changes made  Home readings about 124/80-83   BP Readings from Last 3 Encounters:  06/18/17 (!) 144/88  03/18/17 130/86  10/27/16 128/82    Last eye exam 04/2016  Most recent foot exam: March 2019  Review of Systems    Physical Examination:  BP (!) 144/88 (BP Location: Left Arm, Patient Position: Sitting, Cuff Size: Normal)   Pulse 93   Ht 5' 2"  (1.575 m)   Wt 191 lb 6.4 oz (86.8 kg)   LMP 07/09/2011   SpO2 97%   BMI 35.01 kg/m   No ankle edema present   ASSESSMENT:  Diabetes type 2, insulin-dependent See history of present illness for detailed discussion of current diabetes management, blood sugar patterns and problems identified  Her A1c has finally come down below 7%  However she has gained back significant amount of weight Also recently fasting readings are trending higher and not clear why she has had a couple of high readings before dinner also She has been out of the Invokana for couple of months and this may be playing a role Fructosamine is relatively high indicating recent higher readings even though she had a few days where she did not eat much because  of dental issues and this was causing transiently low normal or low sugars  However has not been able to reduce her insulin dose with continuing  Ozempic and likely is still insulin deficient She usually trying to do fairly well with her diet including and lunch although can still do better  She has not been able to exercise consistently  OBESITY: Weight has gone up partly from not exercising, eating out some more and not taking Invokana  HYPERLIPIDEMIA: She has not taken her Lipitor daily and discussed the cardiovascular benefits of taking this along with controlling her lipid levels   HYPERTENSION: Blood pressure relatively higher but was better when she was on Invokana   PLAN:    Restart SGLT2 drugs She is not getting coverage for Invokana and will start her on Farxiga, discussed this is should have similar effects and will start with 5 mg daily in the morning No change in insulin as needed More readings after lunch and call if consistently high If fasting readings continue to stay high may need to change her to a basal bolus regimen Reminded her that she should take her insulin 15 to 30 minutes before each meal and not take the insulin if not planning to eat  HYPERTENSION: Will follow with starting Farxiga and no other changes To monitor potassium and renal function on this  LIPIDS: We will follow-up on next visit after restarting Lipitor  Counseling time on subjects discussed in assessment and plan sections is over 50% of today's 25 minute visit   Patient Instructions  Check blood sugars on waking up 2-3/7  Also check blood sugars about 2 hours after a meal and do this after different meals by rotation  Recommended blood sugar levels on waking up is 90-130 and about 2 hours after meal is 130-160  Please bring your blood sugar monitor to each visit, thank you  Call if lunch and dinner sugars stays >180     Elayne Snare 06/18/2017, 10:47 AM   Note: This office note was prepared with Dragon voice recognition system technology. Any transcriptional errors that result from this process are unintentional.

## 2017-08-05 ENCOUNTER — Other Ambulatory Visit: Payer: Self-pay | Admitting: Endocrinology

## 2017-08-12 ENCOUNTER — Other Ambulatory Visit (INDEPENDENT_AMBULATORY_CARE_PROVIDER_SITE_OTHER): Payer: 59

## 2017-08-12 DIAGNOSIS — Z794 Long term (current) use of insulin: Secondary | ICD-10-CM | POA: Diagnosis not present

## 2017-08-12 DIAGNOSIS — E7800 Pure hypercholesterolemia, unspecified: Secondary | ICD-10-CM | POA: Diagnosis not present

## 2017-08-12 DIAGNOSIS — E1165 Type 2 diabetes mellitus with hyperglycemia: Secondary | ICD-10-CM | POA: Diagnosis not present

## 2017-08-12 LAB — COMPREHENSIVE METABOLIC PANEL
ALBUMIN: 4.4 g/dL (ref 3.5–5.2)
ALK PHOS: 82 U/L (ref 39–117)
ALT: 10 U/L (ref 0–35)
AST: 14 U/L (ref 0–37)
BILIRUBIN TOTAL: 0.5 mg/dL (ref 0.2–1.2)
BUN: 13 mg/dL (ref 6–23)
CALCIUM: 9.5 mg/dL (ref 8.4–10.5)
CHLORIDE: 106 meq/L (ref 96–112)
CO2: 24 mEq/L (ref 19–32)
Creatinine, Ser: 0.8 mg/dL (ref 0.40–1.20)
GFR: 96.89 mL/min (ref >60.00)
Glucose, Bld: 141 mg/dL — ABNORMAL HIGH (ref 70–99)
Potassium: 3.7 mEq/L (ref 3.5–5.1)
Sodium: 139 mEq/L (ref 135–145)
TOTAL PROTEIN: 7.9 g/dL (ref 6.0–8.3)

## 2017-08-12 LAB — LIPID PANEL
CHOLESTEROL: 176 mg/dL (ref 0–200)
HDL: 45.2 mg/dL (ref >39.00)
LDL CALC: 111 mg/dL — AB (ref 0–99)
NonHDL: 130.49
TRIGLYCERIDES: 98 mg/dL (ref 0.0–149.0)
Total CHOL/HDL Ratio: 4
VLDL: 19.6 mg/dL (ref 0.0–40.0)

## 2017-08-13 LAB — FRUCTOSAMINE: Fructosamine: 250 umol/L (ref 0–285)

## 2017-08-18 ENCOUNTER — Encounter: Payer: Self-pay | Admitting: Endocrinology

## 2017-08-18 ENCOUNTER — Ambulatory Visit (INDEPENDENT_AMBULATORY_CARE_PROVIDER_SITE_OTHER): Payer: 59 | Admitting: Endocrinology

## 2017-08-18 VITALS — BP 144/82 | HR 92 | Ht 62.0 in | Wt 191.0 lb

## 2017-08-18 DIAGNOSIS — Z794 Long term (current) use of insulin: Secondary | ICD-10-CM | POA: Diagnosis not present

## 2017-08-18 DIAGNOSIS — E7800 Pure hypercholesterolemia, unspecified: Secondary | ICD-10-CM

## 2017-08-18 DIAGNOSIS — E1165 Type 2 diabetes mellitus with hyperglycemia: Secondary | ICD-10-CM

## 2017-08-18 NOTE — Progress Notes (Signed)
Patient ID: Veronica Fletcher, female   DOB: [birth year], 52 y.o.   MRN: [id number]           Reason for Appointment: Follow-up for Type 2 Diabetes  Referring physician: Harrington Challenger  History of Present Illness:          Date of diagnosis of type 2 diabetes mellitus: 2005       Background history:  She had been started on metformin initially and not clear what other treatment she had taken subsequently, records are being awaited today She apparently was doing very well with a combination of Victoza and metformin until 2015 and she thinks she was losing weight with this.  She had taken Victoza for several months at least However according to his lab records her best A1c in 2014 was only 7.9 Apparently she was taken off Victoza when she was admitted for abdominal pain presumed to be from pancreatitis in 01/2013 She was then started on premixed insulin and taken off metformin also on discharge. She was referred here for poorly controlled diabetes with A1c consistently around 10% On her initial consultation she was given metformin and Jardiance in addition to her insulin  V-go pump 20 unit basal and boluses 4 units previously stopped  Recent history:   INSULIN regimen is: Novolog Mix at meals: 20 a.m.-18 before dinner   Non-insulin hyperglycemic drugs: Farxiga 5 mg daily, metformin ER 1000 mg daily Ozempic 0.5 mg weekly  Her A1c is much better at 6.9, previously 7.8 Fructosamine improved at 250 compared to 290     Current management, blood sugar patterns and problems identified:  She has had relatively good blood sugars in the mornings but also does not check her sugars much later in the day  Because of her increased blood sugars on her last visit from not getting coverage for her Invokana she is now taking [place]  With this her blood sugars appear to be improved but she has not lost any weight that she had gained  Again because of family commitments and lack of time she has not been  planning her meals well and not exercising or walking as much as before  She has had one episode of hypoglycemia in the morning before breakfast but otherwise her blood sugars are reasonably good in the mornings, averaging about 125  She has only rarely checked her sugars later in the day with a high reading of 215 last night after supper but this is not consistent  Continues to be taking Ozempic without side effects  Side effects from medications have been:?  Victoza caused pancreatitis  Compliance with the medical regimen: Good Hypoglycemia:     Glucose monitoring:  done 0.3 times a day         Glucometer:  Verio   Blood Glucose readings by download  Mean values apply above for all meters except median for One Touch  PRE-MEAL Fasting Lunch Dinner Bedtime Overall  Glucose range:  48-145   60  3-215   Mean/median:  125  148   121   POST-MEAL PC Breakfast PC Lunch PC Dinner  Glucose range:  ?   Mean/median:       PREVIOUS readings:  PRE-MEAL Fasting Lunch Dinner Bedtime Overall  Glucose range:  63-276  124-203    Mean/median:  138  74  190   120+/-58   POST-MEAL PC Breakfast PC Lunch PC Dinner  Glucose range:    79-130  Mean/median:       :Self-care:  The diet that the patient has been following is: tries to limit portions.      Typical meal intake: Breakfast is oatmeal/yogurt, lunch is a grilled chicken, evening meal is meat and 2 vegetables, will have snacks on yogurt    Dinner at 6 pm           Dietician visit, most recent:2014               Exercise: not walking > 20 min  Weight history:   Wt Readings from Last 3 Encounters:  08/18/17 191 lb (86.6 kg)  06/18/17 191 lb 6.4 oz (86.8 kg)  03/18/17 181 lb 9.6 oz (82.4 kg)    Glycemic control:   Lab Results  Component Value Date   HGBA1C 6.9 (H) 06/16/2017   HGBA1C 7.8 (H) 01/23/2017   HGBA1C 8.5 (H) 08/13/2016   Lab Results  Component Value Date   MICROALBUR 1.9 06/16/2017   LDLCALC 111 (H) 08/12/2017     CREATININE 0.80 08/12/2017   Lab Results  Component Value Date   MICRALBCREAT 1.2 06/16/2017    Lab Results  Component Value Date   FRUCTOSAMINE 250 08/12/2017   FRUCTOSAMINE 290 (H) 06/16/2017   FRUCTOSAMINE 284 10/22/2016   FRUCTOSAMINE 344 (H) 07/02/2016     Lab on 08/12/2017  Component Date Value Ref Range Status  . Fructosamine 08/12/2017 250  0 - 285 umol/L Final   Comment: Published reference interval for apparently healthy subjects between age 38 and 64 is 7 - 285 umol/L and in a poorly controlled diabetic population is 228 - 563 umol/L with a mean of 396 umol/L.   Marland Kitchen Cholesterol 08/12/2017 176  0 - 200 mg/dL Final   ATP III Classification       Desirable:  < 200 mg/dL               Borderline High:  200 - 239 mg/dL          High:  > = 240 mg/dL  . Triglycerides 08/12/2017 98.0  0.0 - 149.0 mg/dL Final   Normal:  <150 mg/dLBorderline High:  150 - 199 mg/dL  . HDL 08/12/2017 45.20  >39.00 mg/dL Final  . VLDL 08/12/2017 19.6  0.0 - 40.0 mg/dL Final  . LDL Cholesterol 08/12/2017 111* 0 - 99 mg/dL Final  . Total CHOL/HDL Ratio 08/12/2017 4   Final                  Men          Women1/2 Average Risk     3.4          3.3Average Risk          5.0          4.42X Average Risk          9.6          7.13X Average Risk          15.0          11.0                      . NonHDL 08/12/2017 130.49   Final   NOTE:  Non-HDL goal should be 30 mg/dL higher than patient's LDL goal (i.e. LDL goal of < 70 mg/dL, would have non-HDL goal of < 100 mg/dL)  . Sodium 08/12/2017 139  135 - 145 mEq/L Final  . Potassium 08/12/2017 3.7  3.5 - 5.1 mEq/L Final  . Chloride  08/12/2017 106  96 - 112 mEq/L Final  . CO2 08/12/2017 24  19 - 32 mEq/L Final  . Glucose, Bld 08/12/2017 141* 70 - 99 mg/dL Final  . BUN 08/12/2017 13  6 - 23 mg/dL Final  . Creatinine, Ser 08/12/2017 0.80  0.40 - 1.20 mg/dL Final  . Total Bilirubin 08/12/2017 0.5  0.2 - 1.2 mg/dL Final  . Alkaline Phosphatase 08/12/2017 82  39  - 117 U/L Final  . AST 08/12/2017 14  0 - 37 U/L Final  . ALT 08/12/2017 10  0 - 35 U/L Final  . Total Protein 08/12/2017 7.9  6.0 - 8.3 g/dL Final  . Albumin 08/12/2017 4.4  3.5 - 5.2 g/dL Final  . Calcium 08/12/2017 9.5  8.4 - 10.5 mg/dL Final  . GFR 08/12/2017 96.89  >60.00 mL/min Final      Allergies as of 08/18/2017      Reactions   Liraglutide    Other reaction(s): Pancreatitis      Medication List        Accurate as of 08/18/17  3:01 PM. Always use your most recent med list.          amLODipine 5 MG tablet Commonly known as:  NORVASC Take 5 mg by mouth daily.   aspirin EC 81 MG tablet Take 81 mg by mouth every morning.   atorvastatin 10 MG tablet Commonly known as:  LIPITOR Take 1 tablet (10 mg total) by mouth daily at 6 PM.   cyclobenzaprine 10 MG tablet Commonly known as:  FLEXERIL Take 10 mg by mouth as needed.   dapagliflozin propanediol 5 MG Tabs tablet Commonly known as:  FARXIGA Take 5 mg by mouth daily.   diclofenac 75 MG EC tablet Commonly known as:  VOLTAREN TAKE 1 TABLET BY MOUTH TWICE A DAY AFTER MEALS FOR INFLAMMATION/PAIN/SWELLING TAKE AS NEEDED ONLY   dicyclomine 20 MG tablet Commonly known as:  BENTYL Take 1 tablet (20 mg total) by mouth 2 (two) times daily.   EX-LAX ULTRA 5 MG EC tablet Generic drug:  bisacodyl Take 5 mg by mouth daily as needed for mild constipation or moderate constipation.   gabapentin 300 MG capsule Commonly known as:  NEURONTIN Take 300 mg by mouth at bedtime. Takes 2 tablets at bedtime   glucose blood test strip Commonly known as:  ONETOUCH VERIO Use as instructed to check blood sugar 4 times per day dx code E11.9   GOODY PM PO Take 1 packet by mouth at bedtime as needed. For pain and sleep   meloxicam 7.5 MG tablet Commonly known as:  MOBIC Take 7.5 mg by mouth daily.   metFORMIN 500 MG 24 hr tablet Commonly known as:  GLUCOPHAGE-XR Take 4 tablets (2,000 mg total) by mouth daily with supper.     methocarbamol 500 MG tablet Commonly known as:  ROBAXIN TAKE 1 TABLET BY MOUTH TWICE A DAY AS NEEDED FOR SPASM   multivitamin with minerals tablet Take 1 tablet by mouth every morning. Patient takes Flinstones complete with Vitamin D   NOVOLOG MIX 70/30 FLEXPEN (70-30) 100 UNIT/ML FlexPen Generic drug:  insulin aspart protamine - aspart 18 units in the morning and 16 units at night   ONETOUCH DELICA LANCETS 19T Misc Use to check sugar after meals and at bedtime   ONETOUCH VERIO FLEX SYSTEM w/Device Kit 1 each by Does not apply route 4 (four) times daily.   ramipril 5 MG capsule Commonly known as:  ALTACE Take 1 capsule (  5 mg total) by mouth daily.   Semaglutide 0.25 or 0.5 MG/DOSE Sopn Commonly known as:  OZEMPIC Inject 0.25 mg into the skin once a week. After 4 weeks go to 0.72m   traMADol 50 MG tablet Commonly known as:  ULTRAM Take 50 mg by mouth every 12 (twelve) hours as needed.       Allergies:  Allergies  Allergen Reactions  . Liraglutide     Other reaction(s): Pancreatitis    Past Medical History:  Diagnosis Date  . Diabetes mellitus   . Hyperlipidemia   . Hypertension     Past Surgical History:  Procedure Laterality Date  . CESAREAN SECTION    . CHOLECYSTECTOMY    . GANGLION CYST EXCISION    . LAPAROSCOPY      Family History  Problem Relation Age of Onset  . Diabetes Mother   . Hypertension Mother   . Diabetes Maternal Aunt   . Diabetes Paternal Aunt   . Diabetes Maternal Grandmother   . Heart disease Neg Hx     Social History:  reports that she quit smoking about 4 years ago. Her smoking use included cigarettes. She smoked 0.50 packs per day. She has never used smokeless tobacco. She reports that she drinks alcohol. She reports that she does not use drugs.    Review of Systems    Lipid history: was on Lipitor 10 mg, followed by PCP Previously had not wanted to take her medication but now is agreeable to taking this She has not been  regular with this recently     Lab Results  Component Value Date   CHOL 176 08/12/2017   HDL 45.20 08/12/2017   LDLCALC 111 (H) 08/12/2017   LDLDIRECT 101.0 08/27/2015   TRIG 98.0 08/12/2017   CHOLHDL 4 08/12/2017           Hypertension:  followed by PCP, currently on 547mamlodipine and 5 mg ramipril  This is followed by PCP, blood pressure appears to be better with starting Farxiga   BP Readings from Last 3 Encounters:  08/18/17 (!) 144/82  06/18/17 (!) 144/88  03/18/17 130/86    Last eye exam 04/2016  Most recent foot exam: March 2019  Review of Systems    Physical Examination:  BP (!) 144/82 (BP Location: Left Arm, Patient Position: Sitting, Cuff Size: Normal)   Pulse 92   Ht 5' 2"  (1.575 m)   Wt 191 lb (86.6 kg)   LMP 07/09/2011   SpO2 99%   BMI 34.93 kg/m     ASSESSMENT:  Diabetes type 2, insulin-dependent  See history of present illness for detailed discussion of current diabetes management, blood sugar patterns and problems identified  Her A1c has been below 7%   With starting Farxiga in addition to her insulin and other medications her blood sugars are not fluctuating as much Also weight has stabilized  However she is not taking enough readings after meals Currently not able to find the time for doing enough exercise but she thinks she can start doing this now  Also she think she can start planning her meals better   HYPERLIPIDEMIA: She has not taken her Lipitor daily and she will do so now     HYPERTENSION: Blood pressure improving with starting Farxiga    PLAN:    Start monitoring blood sugars at various times as discussed If her sugars start getting low consistently either morning or afternoon may need to reduce her insulin by 2 to 4  units She will try to work on her weight loss as discussed above  HYPERTENSION: Will stay on same regimen  LIPIDS: We will follow-up on next visit after restarting Lipitor  consistently     Patient Instructions  Check blood sugars on waking up  3/7   Also check blood sugars about 2 hours after a meal and do this after different meals by rotation  Recommended blood sugar levels on waking up is 90-130 and about 2 hours after meal is 130-160  Please bring your blood sugar monitor to each visit, thank you      Elayne Snare 08/18/2017, 3:01 PM   Note: This office note was prepared with Dragon voice recognition system technology. Any transcriptional errors that result from this process are unintentional.

## 2017-08-18 NOTE — Patient Instructions (Signed)
Check blood sugars on waking up 3/7   Also check blood sugars about 2 hours after a meal and do this after different meals by rotation  Recommended blood sugar levels on waking up is 90-130 and about 2 hours after meal is 130-160  Please bring your blood sugar monitor to each visit, thank you  

## 2017-08-25 ENCOUNTER — Other Ambulatory Visit: Payer: Self-pay | Admitting: Endocrinology

## 2017-08-27 ENCOUNTER — Other Ambulatory Visit: Payer: Self-pay

## 2017-08-27 ENCOUNTER — Telehealth: Payer: Self-pay | Admitting: Endocrinology

## 2017-08-27 MED ORDER — ATORVASTATIN CALCIUM 10 MG PO TABS: 10.0000 mg | ORAL_TABLET | Freq: Every day | ORAL | 2 refills | Status: DC

## 2017-08-27 MED ORDER — DAPAGLIFLOZIN PROPANEDIOL 5 MG PO TABS: 5.0000 mg | ORAL_TABLET | Freq: Every day | ORAL | 2 refills | Status: DC

## 2017-08-27 NOTE — Telephone Encounter (Signed)
done

## 2017-08-27 NOTE — Telephone Encounter (Signed)
Please fill prescription for atorvastatin

## 2017-08-27 NOTE — Telephone Encounter (Signed)
Patient stated Dr Dwyane Dee was going to call in  atorvastatin (LIPITOR) 10 MG tablet and she has not received a call from the pharmacy that it has been recieved      [id number] - [place], [place]

## 2017-08-27 NOTE — Telephone Encounter (Signed)
Will you be filling pts Lipitor instead of PCP

## 2017-09-18 ENCOUNTER — Telehealth: Payer: Self-pay | Admitting: Endocrinology

## 2017-09-18 DIAGNOSIS — Z794 Long term (current) use of insulin: Secondary | ICD-10-CM

## 2017-09-18 DIAGNOSIS — E1165 Type 2 diabetes mellitus with hyperglycemia: Secondary | ICD-10-CM

## 2017-09-18 MED ORDER — METFORMIN HCL ER 500 MG PO TB24: 2000.0000 mg | ORAL_TABLET | Freq: Every day | ORAL | 0 refills | Status: DC

## 2017-09-18 NOTE — Telephone Encounter (Signed)
Sent!

## 2017-09-18 NOTE — Telephone Encounter (Signed)
Patient needs RX for Metformin [facility] to [facility] on [address]

## 2017-10-17 ENCOUNTER — Other Ambulatory Visit: Payer: Self-pay | Admitting: Endocrinology

## 2017-11-23 ENCOUNTER — Other Ambulatory Visit: Payer: 59

## 2017-11-26 ENCOUNTER — Ambulatory Visit: Payer: 59 | Admitting: Endocrinology

## 2017-12-16 ENCOUNTER — Other Ambulatory Visit: Payer: Self-pay | Admitting: Endocrinology

## 2017-12-16 DIAGNOSIS — Z794 Long term (current) use of insulin: Secondary | ICD-10-CM

## 2017-12-16 DIAGNOSIS — E1165 Type 2 diabetes mellitus with hyperglycemia: Secondary | ICD-10-CM

## 2018-01-12 ENCOUNTER — Ambulatory Visit: Payer: 59 | Admitting: Endocrinology

## 2018-01-19 ENCOUNTER — Other Ambulatory Visit (INDEPENDENT_AMBULATORY_CARE_PROVIDER_SITE_OTHER): Payer: 59

## 2018-01-19 DIAGNOSIS — E7800 Pure hypercholesterolemia, unspecified: Secondary | ICD-10-CM | POA: Diagnosis not present

## 2018-01-19 DIAGNOSIS — Z794 Long term (current) use of insulin: Secondary | ICD-10-CM

## 2018-01-19 DIAGNOSIS — E1165 Type 2 diabetes mellitus with hyperglycemia: Secondary | ICD-10-CM

## 2018-01-19 LAB — COMPREHENSIVE METABOLIC PANEL
ALBUMIN: 4 g/dL (ref 3.5–5.2)
ALK PHOS: 80 U/L (ref 39–117)
ALT: 12 U/L (ref 0–35)
AST: 13 U/L (ref 0–37)
BILIRUBIN TOTAL: 0.4 mg/dL (ref 0.2–1.2)
BUN: 11 mg/dL (ref 6–23)
CALCIUM: 9.3 mg/dL (ref 8.4–10.5)
CO2: 25 mEq/L (ref 19–32)
CREATININE: 0.74 mg/dL (ref 0.40–1.20)
Chloride: 106 mEq/L (ref 96–112)
GFR: 105.83 mL/min (ref >60.00)
GLUCOSE: 153 mg/dL — AB (ref 70–99)
Potassium: 4 mEq/L (ref 3.5–5.1)
Sodium: 139 mEq/L (ref 135–145)
Total Protein: 7.1 g/dL (ref 6.0–8.3)

## 2018-01-19 LAB — LIPID PANEL
CHOLESTEROL: 203 mg/dL — AB (ref 0–200)
HDL: 49.6 mg/dL (ref >39.00)
LDL Cholesterol: 135 mg/dL — ABNORMAL HIGH (ref 0–99)
NonHDL: 153.56
TRIGLYCERIDES: 93 mg/dL (ref 0.0–149.0)
Total CHOL/HDL Ratio: 4
VLDL: 18.6 mg/dL (ref 0.0–40.0)

## 2018-01-19 LAB — HEMOGLOBIN A1C: Hgb A1c MFr Bld: 7.9 % — ABNORMAL HIGH (ref 4.6–6.5)

## 2018-01-20 NOTE — Progress Notes (Signed)
Patient ID: Veronica Fletcher, female   DOB: Sep 03, 1965, 53 y.o.   MRN: [id number]           Reason for Appointment: Follow-up for Type 2 Diabetes  Referring physician: Harrington Challenger  History of Present Illness:          Date of diagnosis of type 2 diabetes mellitus: 2005       Background history:  She had been started on metformin initially and not clear what other treatment she had taken subsequently, records are being awaited today She apparently was doing very well with a combination of Victoza and metformin until 2015 and she thinks she was losing weight with this.  She had taken Victoza for several months at least However according to his lab records her best A1c in 2014 was only 7.9 Apparently she was taken off Victoza when she was admitted for abdominal pain presumed to be from pancreatitis in 01/2013 She was then started on premixed insulin and taken off metformin also on discharge. She was referred here for poorly controlled diabetes with A1c consistently around 10% On her initial consultation she was given metformin and Jardiance in addition to her insulin  V-go pump 20 unit basal and boluses 4 units previously stopped  Recent history:   INSULIN regimen is: Novolog Mix at meals: 18a.m.-16 before dinner   Non-insulin hypoglycemic drugs: Farxiga 5 mg daily, metformin ER 1000 mg daily Ozempic 0.5 mg weekly  Her A1c is back up to 7.9 and was down to 6.9 last June  She has not been seen in follow-up since 08/2017  Current management, blood sugar patterns and problems identified:  She has not brought her monitor for download  She thinks that because of her job situation and other personal issues that she has not been walking, checking sugars much more paying attention to her diabetes as much  She is also taking 2 units less on her insulin than previously discussed  She is however saying that she is taking her Ozempic and [place] regularly even though she did not have enough refills on  the last prescription for Ozempic  Blood sugars are reportedly higher at dinnertime in the afternoon although not as high in the morning  She does not check readings after meals  Has gained weight  No hypoglycemic symptoms reported  Does not think she needs to see the dietitian  She can try to improve her diet on her own  Side effects from medications have been:?  Victoza caused pancreatitis  Compliance with the medical regimen: Good Hypoglycemia:     Glucose monitoring:  done 0.3 times a day         Glucometer:  Verio   Blood Glucose readings by recall   PRE-MEAL Fasting Lunch Dinner Bedtime Overall  Glucose range: 110-150  200 <160   Mean/median:          :Self-care: The diet that the patient has been following is: tries to limit portions.      Typical meal intake: Breakfast is oatmeal/yogurt, lunch is a grilled chicken, evening meal is meat and 2 vegetables, will have snacks on yogurt    Dinner at 6 pm           Dietician visit, most recent:2014               Exercise: not walking   Weight history:   Wt Readings from Last 3 Encounters:  01/21/18 195 lb 3.2 oz (88.5 kg)  08/18/17 191 lb (86.6 kg)  06/18/17 191 lb 6.4 oz (86.8 kg)    Glycemic control:   Lab Results  Component Value Date   HGBA1C 7.9 (H) 01/19/2018   HGBA1C 6.9 (H) 06/16/2017   HGBA1C 7.8 (H) 01/23/2017   Lab Results  Component Value Date   MICROALBUR 1.9 06/16/2017   LDLCALC 135 (H) 01/19/2018   CREATININE 0.74 01/19/2018   Lab Results  Component Value Date   MICRALBCREAT 1.2 06/16/2017    Lab Results  Component Value Date   FRUCTOSAMINE 250 08/12/2017   FRUCTOSAMINE 290 (H) 06/16/2017   FRUCTOSAMINE 284 10/22/2016   FRUCTOSAMINE 344 (H) 07/02/2016     Lab on 01/19/2018  Component Date Value Ref Range Status  . Cholesterol 01/19/2018 203* 0 - 200 mg/dL Final   ATP III Classification       Desirable:  < 200 mg/dL               Borderline High:  200 - 239 mg/dL           High:  > = 240 mg/dL  . Triglycerides 01/19/2018 93.0  0.0 - 149.0 mg/dL Final   Normal:  <150 mg/dLBorderline High:  150 - 199 mg/dL  . HDL 01/19/2018 49.60  >39.00 mg/dL Final  . VLDL 01/19/2018 18.6  0.0 - 40.0 mg/dL Final  . LDL Cholesterol 01/19/2018 135* 0 - 99 mg/dL Final  . Total CHOL/HDL Ratio 01/19/2018 4   Final                  Men          Women1/2 Average Risk     3.4          3.3Average Risk          5.0          4.42X Average Risk          9.6          7.13X Average Risk          15.0          11.0                      . NonHDL 01/19/2018 153.56   Final   NOTE:  Non-HDL goal should be 30 mg/dL higher than patient's LDL goal (i.e. LDL goal of < 70 mg/dL, would have non-HDL goal of < 100 mg/dL)  . Sodium 01/19/2018 139  135 - 145 mEq/L Final  . Potassium 01/19/2018 4.0  3.5 - 5.1 mEq/L Final  . Chloride 01/19/2018 106  96 - 112 mEq/L Final  . CO2 01/19/2018 25  19 - 32 mEq/L Final  . Glucose, Bld 01/19/2018 153* 70 - 99 mg/dL Final  . BUN 01/19/2018 11  6 - 23 mg/dL Final  . Creatinine, Ser 01/19/2018 0.74  0.40 - 1.20 mg/dL Final  . Total Bilirubin 01/19/2018 0.4  0.2 - 1.2 mg/dL Final  . Alkaline Phosphatase 01/19/2018 80  39 - 117 U/L Final  . AST 01/19/2018 13  0 - 37 U/L Final  . ALT 01/19/2018 12  0 - 35 U/L Final  . Total Protein 01/19/2018 7.1  6.0 - 8.3 g/dL Final  . Albumin 01/19/2018 4.0  3.5 - 5.2 g/dL Final  . Calcium 01/19/2018 9.3  8.4 - 10.5 mg/dL Final  . GFR 01/19/2018 105.83  >60.00 mL/min Final  . Hgb A1c MFr Bld 01/19/2018 7.9* 4.6 - 6.5 % Final   Glycemic Control Guidelines  for People with Diabetes:Non Diabetic:  <6%Goal of Therapy: <7%Additional Action Suggested:  >8%       Allergies as of 01/21/2018      Reactions   Liraglutide    Other reaction(s): Pancreatitis      Medication List       Accurate as of January 21, 2018  9:44 AM. Always use your most recent med list.        amLODipine 5 MG tablet Commonly known as:  NORVASC Take 5 mg by  mouth daily.   aspirin EC 81 MG tablet Take 81 mg by mouth every morning.   atorvastatin 10 MG tablet Commonly known as:  LIPITOR TAKE 1 TABLET (10 MG TOTAL) BY MOUTH DAILY AT 6 PM.   cyclobenzaprine 10 MG tablet Commonly known as:  FLEXERIL Take 10 mg by mouth as needed.   dapagliflozin propanediol 10 MG Tabs tablet Commonly known as:  FARXIGA Take 10 mg by mouth daily.   diclofenac 75 MG EC tablet Commonly known as:  VOLTAREN TAKE 1 TABLET BY MOUTH TWICE A DAY AFTER MEALS FOR INFLAMMATION/PAIN/SWELLING TAKE AS NEEDED ONLY   dicyclomine 20 MG tablet Commonly known as:  BENTYL Take 1 tablet (20 mg total) by mouth 2 (two) times daily.   EX-LAX ULTRA 5 MG EC tablet Generic drug:  bisacodyl Take 5 mg by mouth daily as needed for mild constipation or moderate constipation.   gabapentin 300 MG capsule Commonly known as:  NEURONTIN Take 300 mg by mouth at bedtime. Takes 2 tablets at bedtime   glucose blood test strip Commonly known as:  ONETOUCH VERIO Use as instructed to check blood sugar 4 times per day dx code E11.9   GOODY PM PO Take 1 packet by mouth at bedtime as needed. For pain and sleep   meloxicam 7.5 MG tablet Commonly known as:  MOBIC Take 7.5 mg by mouth daily.   metFORMIN 500 MG 24 hr tablet Commonly known as:  GLUCOPHAGE-XR TAKE 4 TABLETS (2,000 MG TOTAL) BY MOUTH DAILY WITH SUPPER.   methocarbamol 500 MG tablet Commonly known as:  ROBAXIN TAKE 1 TABLET BY MOUTH TWICE A DAY AS NEEDED FOR SPASM   multivitamin with minerals tablet Take 1 tablet by mouth every morning. Patient takes Flinstones complete with Vitamin D   NOVOLOG MIX 70/30 FLEXPEN (70-30) 100 UNIT/ML FlexPen Generic drug:  insulin aspart protamine - aspart 18 units in the morning and 16 units at night   ONETOUCH DELICA LANCETS 18E Misc Use to check sugar after meals and at bedtime   ONETOUCH VERIO FLEX SYSTEM w/Device Kit 1 each by Does not apply route 4 (four) times daily.     ramipril 5 MG capsule Commonly known as:  ALTACE Take 1 capsule (5 mg total) by mouth daily.   Semaglutide (1 MG/DOSE) 2 MG/1.5ML Sopn Commonly known as:  OZEMPIC (1 MG/DOSE) Inject 1 mg into the skin once a week.   traMADol 50 MG tablet Commonly known as:  ULTRAM Take 50 mg by mouth every 12 (twelve) hours as needed.       Allergies:  Allergies  Allergen Reactions  . Liraglutide     Other reaction(s): Pancreatitis    Past Medical History:  Diagnosis Date  . Diabetes mellitus   . Hyperlipidemia   . Hypertension     Past Surgical History:  Procedure Laterality Date  . CESAREAN SECTION    . CHOLECYSTECTOMY    . GANGLION CYST EXCISION    . LAPAROSCOPY  Family History  Problem Relation Age of Onset  . Diabetes Mother   . Hypertension Mother   . Diabetes Maternal Aunt   . Diabetes Paternal Aunt   . Diabetes Maternal Grandmother   . Heart disease Neg Hx     Social History:  reports that she quit smoking about 4 years ago. Her smoking use included cigarettes. She smoked 0.50 packs per day. She has never used smokeless tobacco. She reports current alcohol use. She reports that she does not use drugs.    Review of Systems    Lipid history: was on Lipitor 10 mg, followed by PCP Lipids are not controlled She has not been regular with this recently as she forgets despite reminding her on the last visit     Lab Results  Component Value Date   CHOL 203 (H) 01/19/2018   HDL 49.60 01/19/2018   LDLCALC 135 (H) 01/19/2018   LDLDIRECT 101.0 08/27/2015   TRIG 93.0 01/19/2018   CHOLHDL 4 01/19/2018           Hypertension:  followed by PCP, currently on 62m amlodipine and 5 mg ramipril  This is followed by PCP    BP Readings from Last 3 Encounters:  01/21/18 (!) 150/90  08/18/17 (!) 144/82  06/18/17 (!) 144/88    Last eye exam 04/2016  Most recent foot exam: March 2019  Review of Systems    Physical Examination:  BP (!) 150/90 (BP Location: Left  Arm, Patient Position: Sitting, Cuff Size: Normal)   Pulse 80   Ht 5' 2"  (1.575 m)   Wt 195 lb 3.2 oz (88.5 kg)   LMP 07/09/2011   SpO2 98%   BMI 35.70 kg/m     ASSESSMENT:  Diabetes type 2, insulin-dependent  See history of present illness for detailed discussion of current diabetes management, blood sugar patterns and problems identified  Her A1c has been below 7% but now back up to 7.9  Her sugars are probably higher from inconsistent lifestyle, lack of exercise, stress and other factors not clear Also weight has gone up Unable to see her blood sugar readings from home monitor as she did not bring it and most likely is not taking enough including after meals Offered consultation with the dietitian but she does not want to do this Currently taking only relatively small doses of insulin  HYPERLIPIDEMIA: She has been prescribed Lipitor 10 mg   HYPERTENSION: Blood pressure not controlled Blood pressure is relatively higher partly because of being in pain today   PLAN:    Start walking regularly She needs to bring her monitor for download at next visit She will increase her morning insulin by 4 units for now Increase both Farxiga and Ozempic to double the doses New prescription for 10 mg Farxiga and 1 mg Ozempic has been sent We will call if she has any nausea with Ozempic Consider consultation with dietitian on the next visit Need to check more readings after meals  HYPERTENSION:  Will stay on same regimen and follow-up with PCP, also may benefit from increased FHolstein Not controlled and she is still noncompliant with her treatment We will follow-up on next visit after taking Lipitor consistently, may consider increasing the dose  Counseling time on subjects discussed in assessment and plan sections is over 50% of today's 25 minute visit    Patient Instructions  Check blood sugars on waking up 3 days a week  Also check blood sugars about 2 hours after  meals and do this after different meals by rotation  Recommended blood sugar levels on waking up are 90-130 and about 2 hours after meal is 130-160  Please bring your blood sugar monitor to each visit, thank you  Insulin 22 units in am and 16 in pm  Take 2 pills of Farxiga 39m then Rx 139m Take Lipitor daily  Walk daily     AjElayne Snare/09/2018, 9:44 AM   Note: This office note was prepared with Dragon voice recognition system technology. Any transcriptional errors that result from this process are unintentional.

## 2018-01-21 ENCOUNTER — Ambulatory Visit (INDEPENDENT_AMBULATORY_CARE_PROVIDER_SITE_OTHER): Payer: 59 | Admitting: Endocrinology

## 2018-01-21 ENCOUNTER — Encounter: Payer: Self-pay | Admitting: Endocrinology

## 2018-01-21 VITALS — BP 150/90 | HR 80 | Ht 62.0 in | Wt 195.2 lb

## 2018-01-21 DIAGNOSIS — Z794 Long term (current) use of insulin: Secondary | ICD-10-CM | POA: Diagnosis not present

## 2018-01-21 DIAGNOSIS — I10 Essential (primary) hypertension: Secondary | ICD-10-CM | POA: Diagnosis not present

## 2018-01-21 DIAGNOSIS — E1165 Type 2 diabetes mellitus with hyperglycemia: Secondary | ICD-10-CM | POA: Diagnosis not present

## 2018-01-21 DIAGNOSIS — E7800 Pure hypercholesterolemia, unspecified: Secondary | ICD-10-CM | POA: Diagnosis not present

## 2018-01-21 MED ORDER — SEMAGLUTIDE (1 MG/DOSE) 2 MG/1.5ML ~~LOC~~ SOPN: 1.0000 mg | PEN_INJECTOR | SUBCUTANEOUS | 3 refills | Status: DC

## 2018-01-21 MED ORDER — DAPAGLIFLOZIN PROPANEDIOL 10 MG PO TABS: 10.0000 mg | ORAL_TABLET | Freq: Every day | ORAL | 3 refills | Status: DC

## 2018-01-21 NOTE — Patient Instructions (Addendum)
Check blood sugars on waking up 3 days a week  Also check blood sugars about 2 hours after meals and do this after different meals by rotation  Recommended blood sugar levels on waking up are 90-130 and about 2 hours after meal is 130-160  Please bring your blood sugar monitor to each visit, thank you  Insulin 22 units in am and 16 in pm  Take 2 pills of Farxiga 5mg  then Rx 10mg   Take Lipitor daily  Walk daily

## 2018-03-22 ENCOUNTER — Other Ambulatory Visit (INDEPENDENT_AMBULATORY_CARE_PROVIDER_SITE_OTHER): Payer: 59

## 2018-03-22 DIAGNOSIS — E1165 Type 2 diabetes mellitus with hyperglycemia: Secondary | ICD-10-CM

## 2018-03-22 DIAGNOSIS — E7800 Pure hypercholesterolemia, unspecified: Secondary | ICD-10-CM

## 2018-03-22 DIAGNOSIS — Z794 Long term (current) use of insulin: Secondary | ICD-10-CM

## 2018-03-22 LAB — BASIC METABOLIC PANEL
BUN: 12 mg/dL (ref 6–23)
CO2: 24 mEq/L (ref 19–32)
Calcium: 9.5 mg/dL (ref 8.4–10.5)
Chloride: 105 mEq/L (ref 96–112)
Creatinine, Ser: 0.78 mg/dL (ref 0.40–1.20)
GFR: 93.64 mL/min (ref >60.00)
Glucose, Bld: 58 mg/dL — ABNORMAL LOW (ref 70–99)
Potassium: 3.9 mEq/L (ref 3.5–5.1)
SODIUM: 138 meq/L (ref 135–145)

## 2018-03-22 LAB — LDL CHOLESTEROL, DIRECT: Direct LDL: 75 mg/dL

## 2018-03-23 LAB — FRUCTOSAMINE: Fructosamine: 293 umol/L — ABNORMAL HIGH (ref 0–285)

## 2018-03-25 ENCOUNTER — Ambulatory Visit: Payer: 59 | Admitting: Endocrinology

## 2018-04-01 ENCOUNTER — Other Ambulatory Visit: Payer: Self-pay

## 2018-04-01 ENCOUNTER — Ambulatory Visit (INDEPENDENT_AMBULATORY_CARE_PROVIDER_SITE_OTHER): Payer: 59 | Admitting: Endocrinology

## 2018-04-01 ENCOUNTER — Encounter: Payer: Self-pay | Admitting: Endocrinology

## 2018-04-01 VITALS — BP 140/78 | HR 96 | Temp 98.3°F | Ht 62.0 in | Wt 200.8 lb

## 2018-04-01 DIAGNOSIS — Z23 Encounter for immunization: Secondary | ICD-10-CM

## 2018-04-01 DIAGNOSIS — Z794 Long term (current) use of insulin: Secondary | ICD-10-CM | POA: Diagnosis not present

## 2018-04-01 DIAGNOSIS — E1165 Type 2 diabetes mellitus with hyperglycemia: Secondary | ICD-10-CM

## 2018-04-01 NOTE — Patient Instructions (Signed)
Check blood sugars on waking up 5  days a week  Also check blood sugars about 2 hours after meals and do this after different meals by rotation  Recommended blood sugar levels on waking up are 90-130 and about 2 hours after meal is 130-160  Please bring your blood sugar monitor to each visit, thank you  

## 2018-04-01 NOTE — Progress Notes (Signed)
Patient ID: Veronica Fletcher, female   DOB: 1965/09/11, 53 y.o.   MRN: [id number]           Reason for Appointment: Follow-up for Type 2 Diabetes  Referring physician: Harrington Challenger  History of Present Illness:          Date of diagnosis of type 2 diabetes mellitus: 2005       Background history:  She had been started on metformin initially and not clear what other treatment she had taken subsequently, records are being awaited today She apparently was doing very well with a combination of Victoza and metformin until 2015 and she thinks she was losing weight with this.  She had taken Victoza for several months at least However according to his lab records her best A1c in 2014 was only 7.9 Apparently she was taken off Victoza when she was admitted for abdominal pain presumed to be from pancreatitis in 01/2013 She was then started on premixed insulin and taken off metformin also on discharge. She was referred here for poorly controlled diabetes with A1c consistently around 10% On her initial consultation she was given metformin and Jardiance in addition to her insulin  V-go pump 20 unit basal and boluses 4 units previously stopped  Recent history:   INSULIN regimen is: Novolog Mix at meals: 18a.m.-16 before dinner   Non-insulin hypoglycemic drugs: Farxiga 10 mg daily, metformin ER 1000 mg daily Ozempic 1.0 mg weekly  Her A1c is last up to 7.9 and fructosamine is 293   Current management, blood sugar patterns and problems identified:  She has brought her monitor for download  She was told to increase her [place] and Ozempic to the highest doses  However she still has gained 5 pounds  No nausea from increase Ozempic  She did not have any consistently high readings but did not have her meter on her last visit to compare  She is still stating that she is trying to cut back on high fat foods and overall not eating as much especially recently with decreased appetite but cannot explain her  weight gain  Previously has been reluctant to see the dietitian  As before she has not exercised  No consistent pattern of blood sugars and she has variable readings in the mornings  Not checking enough readings in the evenings to evaluate postprandial readings also  She did have a low sugar of 46 one night possibly from decreased intake  Side effects from medications have been:?  Victoza caused pancreatitis  Compliance with the medical regimen: Good Hypoglycemia:     Glucose monitoring:  done 0.5 times a day         Glucometer:  Verio   Blood Glucose readings by    PRE-MEAL Fasting Lunch Dinner Bedtime Overall  Glucose range:  122-204  70-175  137, 198  46   Mean/median:  144  169   130   POST-MEAL PC Breakfast PC Lunch PC Dinner  Glucose range:    103, 116  Mean/median:        :Self-care: The diet that the patient has been following is: tries to limit portions.      Typical meal intake: Breakfast is oatmeal/yogurt, lunch is a grilled chicken, evening meal is meat and 2 vegetables, will have snacks on yogurt    Dinner at 6 pm           Dietician visit, most recent:2014  Exercise: not walking   Weight history:   Wt Readings from Last 3 Encounters:  04/01/18 200 lb 12.8 oz (91.1 kg)  01/21/18 195 lb 3.2 oz (88.5 kg)  08/18/17 191 lb (86.6 kg)    Glycemic control:   Lab Results  Component Value Date   HGBA1C 7.9 (H) 01/19/2018   HGBA1C 6.9 (H) 06/16/2017   HGBA1C 7.8 (H) 01/23/2017   Lab Results  Component Value Date   MICROALBUR 1.9 06/16/2017   LDLCALC 135 (H) 01/19/2018   CREATININE 0.78 03/22/2018   Lab Results  Component Value Date   MICRALBCREAT 1.2 06/16/2017    Lab Results  Component Value Date   FRUCTOSAMINE 293 (H) 03/22/2018   FRUCTOSAMINE 250 08/12/2017   FRUCTOSAMINE 290 (H) 06/16/2017   FRUCTOSAMINE 284 10/22/2016     No visits with results within 1 Week(s) from this visit.  Latest known visit with results is:   Lab on 03/22/2018  Component Date Value Ref Range Status  . Direct LDL 03/22/2018 75.0  mg/dL Final   Optimal:  <100 mg/dLNear or Above Optimal:  100-129 mg/dLBorderline High:  130-159 mg/dLHigh:  160-189 mg/dLVery High:  >190 mg/dL  . Fructosamine 03/22/2018 293* 0 - 285 umol/L Final   Comment: Published reference interval for apparently healthy subjects between age 80 and 97 is 56 - 285 umol/L and in a poorly controlled diabetic population is 228 - 563 umol/L with a mean of 396 umol/L.   Marland Kitchen Sodium 03/22/2018 138  135 - 145 mEq/L Final  . Potassium 03/22/2018 3.9  3.5 - 5.1 mEq/L Final  . Chloride 03/22/2018 105  96 - 112 mEq/L Final  . CO2 03/22/2018 24  19 - 32 mEq/L Final  . Glucose, Bld 03/22/2018 58* 70 - 99 mg/dL Final  . BUN 03/22/2018 12  6 - 23 mg/dL Final  . Creatinine, Ser 03/22/2018 0.78  0.40 - 1.20 mg/dL Final  . Calcium 03/22/2018 9.5  8.4 - 10.5 mg/dL Final  . GFR 03/22/2018 93.64  >60.00 mL/min Final      Allergies as of 04/01/2018      Reactions   Liraglutide    Other reaction(s): Pancreatitis      Medication List       Accurate as of April 01, 2018  2:04 PM. Always use your most recent med list.        amLODipine 5 MG tablet Commonly known as:  NORVASC Take 5 mg by mouth daily.   aspirin EC 81 MG tablet Take 81 mg by mouth every morning.   atorvastatin 10 MG tablet Commonly known as:  LIPITOR TAKE 1 TABLET (10 MG TOTAL) BY MOUTH DAILY AT 6 PM.   cyclobenzaprine 10 MG tablet Commonly known as:  FLEXERIL Take 10 mg by mouth as needed.   dapagliflozin propanediol 10 MG Tabs tablet Commonly known as:  Farxiga Take 10 mg by mouth daily.   diclofenac 75 MG EC tablet Commonly known as:  VOLTAREN TAKE 1 TABLET BY MOUTH TWICE A DAY AFTER MEALS FOR INFLAMMATION/PAIN/SWELLING TAKE AS NEEDED ONLY   dicyclomine 20 MG tablet Commonly known as:  BENTYL Take 1 tablet (20 mg total) by mouth 2 (two) times daily.   Ex-Lax Ultra 5 MG EC tablet Generic  drug:  bisacodyl Take 5 mg by mouth daily as needed for mild constipation or moderate constipation.   gabapentin 300 MG capsule Commonly known as:  NEURONTIN Take 300 mg by mouth at bedtime. Takes 2 tablets at bedtime  glucose blood test strip Commonly known as:  OneTouch Verio Use as instructed to check blood sugar 4 times per day dx code E11.9   GOODY PM PO Take 1 packet by mouth at bedtime as needed. For pain and sleep   meloxicam 7.5 MG tablet Commonly known as:  MOBIC Take 7.5 mg by mouth daily.   metFORMIN 500 MG 24 hr tablet Commonly known as:  GLUCOPHAGE-XR TAKE 4 TABLETS (2,000 MG TOTAL) BY MOUTH DAILY WITH SUPPER.   methocarbamol 500 MG tablet Commonly known as:  ROBAXIN TAKE 1 TABLET BY MOUTH TWICE A DAY AS NEEDED FOR SPASM   multivitamin with minerals tablet Take 1 tablet by mouth every morning. Patient takes Flinstones complete with Vitamin D   NovoLOG Mix 70/30 FlexPen (70-30) 100 UNIT/ML FlexPen Generic drug:  insulin aspart protamine - aspart 18 units in the morning and 16 units at night   OneTouch Delica Lancets 08U Misc Use to check sugar after meals and at bedtime   OneTouch Verio Flex System w/Device Kit 1 each by Does not apply route 4 (four) times daily.   ramipril 5 MG capsule Commonly known as:  ALTACE Take 1 capsule (5 mg total) by mouth daily.   Semaglutide (1 MG/DOSE) 2 MG/1.5ML Sopn Commonly known as:  Ozempic (1 MG/DOSE) Inject 1 mg into the skin once a week.   traMADol 50 MG tablet Commonly known as:  ULTRAM Take 50 mg by mouth every 12 (twelve) hours as needed.       Allergies:  Allergies  Allergen Reactions  . Liraglutide     Other reaction(s): Pancreatitis    Past Medical History:  Diagnosis Date  . Diabetes mellitus   . Hyperlipidemia   . Hypertension     Past Surgical History:  Procedure Laterality Date  . CESAREAN SECTION    . CHOLECYSTECTOMY    . GANGLION CYST EXCISION    . LAPAROSCOPY      Family  History  Problem Relation Age of Onset  . Diabetes Mother   . Hypertension Mother   . Diabetes Maternal Aunt   . Diabetes Paternal Aunt   . Diabetes Maternal Grandmother   . Heart disease Neg Hx     Social History:  reports that she quit smoking about 5 years ago. Her smoking use included cigarettes. She smoked 0.50 packs per day. She has never used smokeless tobacco. She reports current alcohol use. She reports that she does not use drugs.    Review of Systems    Lipid history: was on Lipitor 10 mg, followed by PCP Lipids are controlled    Lab Results  Component Value Date   CHOL 203 (H) 01/19/2018   HDL 49.60 01/19/2018   LDLCALC 135 (H) 01/19/2018   LDLDIRECT 75.0 03/22/2018   TRIG 93.0 01/19/2018   CHOLHDL 4 01/19/2018           Hypertension:  followed by PCP, currently on 8m amlodipine and 5 mg ramipril  This is followed by PCP    BP Readings from Last 3 Encounters:  04/01/18 140/78  01/21/18 (!) 150/90  08/18/17 (!) 144/82    Last eye exam 04/2016  Most recent foot exam: March 2019  Review of Systems    Physical Examination:  BP 140/78 (BP Location: Left Arm, Patient Position: Sitting, Cuff Size: Normal)   Pulse 96   Temp 98.3 F (36.8 C) (Oral)   Ht _0  (1.575 m)   Wt 200 lb 12.8 oz (91.1 kg)  LMP 07/09/2011   SpO2 97%   BMI 36.73 kg/m     ASSESSMENT:  Diabetes type 2, insulin-dependent  See history of present illness for detailed discussion of current diabetes management, blood sugar patterns and problems identified  Her A1c has been higher this year at 7.9  However fructosamine of 293 indicates better control This is from increased [place] and [place] Also she may be doing better with overall caloric intake However not clear why her weight has gone up As before her blood sugars fluctuate with using premixed insulin including occasional hypoglycemia  HYPERLIPIDEMIA: She has been taking her Lipitor 10 mg regularly and LDL is  significantly better   HYPERTENSION: Blood pressure appears better controlled     PLAN:    Start walking regularly when she is able to Call if starting to have any significant hypoglycemia more consistently and may need to reduce her evening dose Bedtime snack if eating only a small evening meal Need to check more readings during the day and evening  HYPERTENSION:  Appears somewhat better controlled  LIPIDS: Now better controlled with her taking Lipitor consistently and recommended that she continue to do the same dose as well as watch her diet consistently     There are no Patient Instructions on file for this visit.    Elayne Snare 04/01/2018, 2:04 PM   Note: This office note was prepared with Dragon voice recognition system technology. Any transcriptional errors that result from this process are unintentional.

## 2018-04-07 IMAGING — CR DG ABDOMEN ACUTE W/ 1V CHEST
3 series · 3 of 3 positions shown · non-contrast
Comparison: No recent prior.

CLINICAL DATA: Nausea and vomiting.

EXAM:
DG ABDOMEN ACUTE W/ 1V CHEST

[w chest pa]
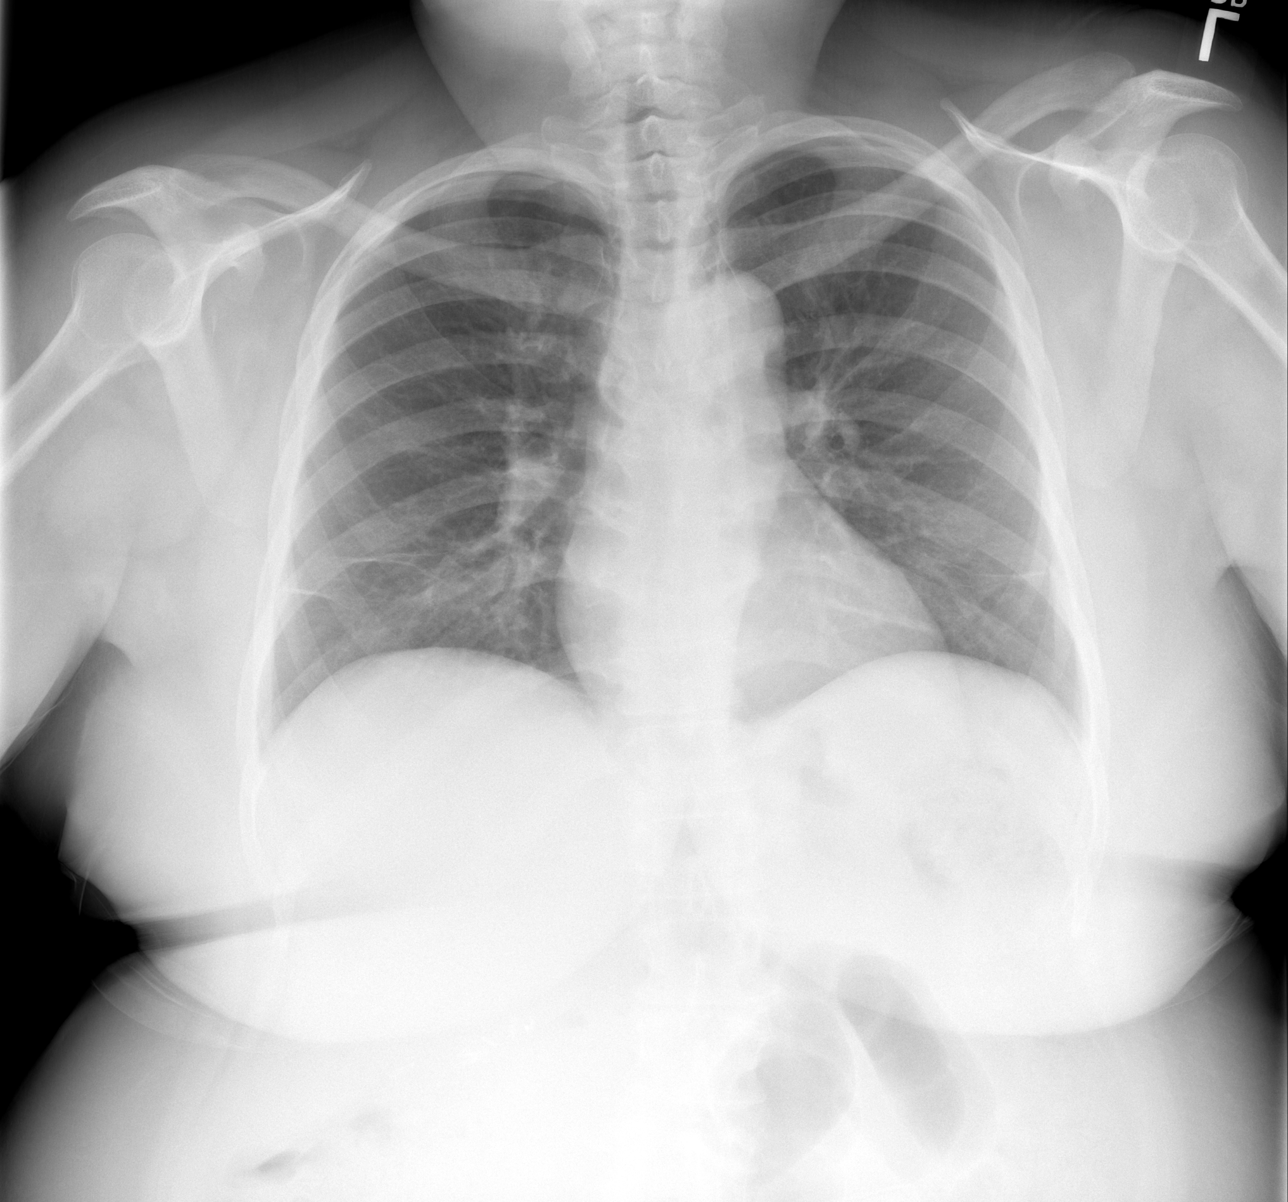

[w abdomen upright *]
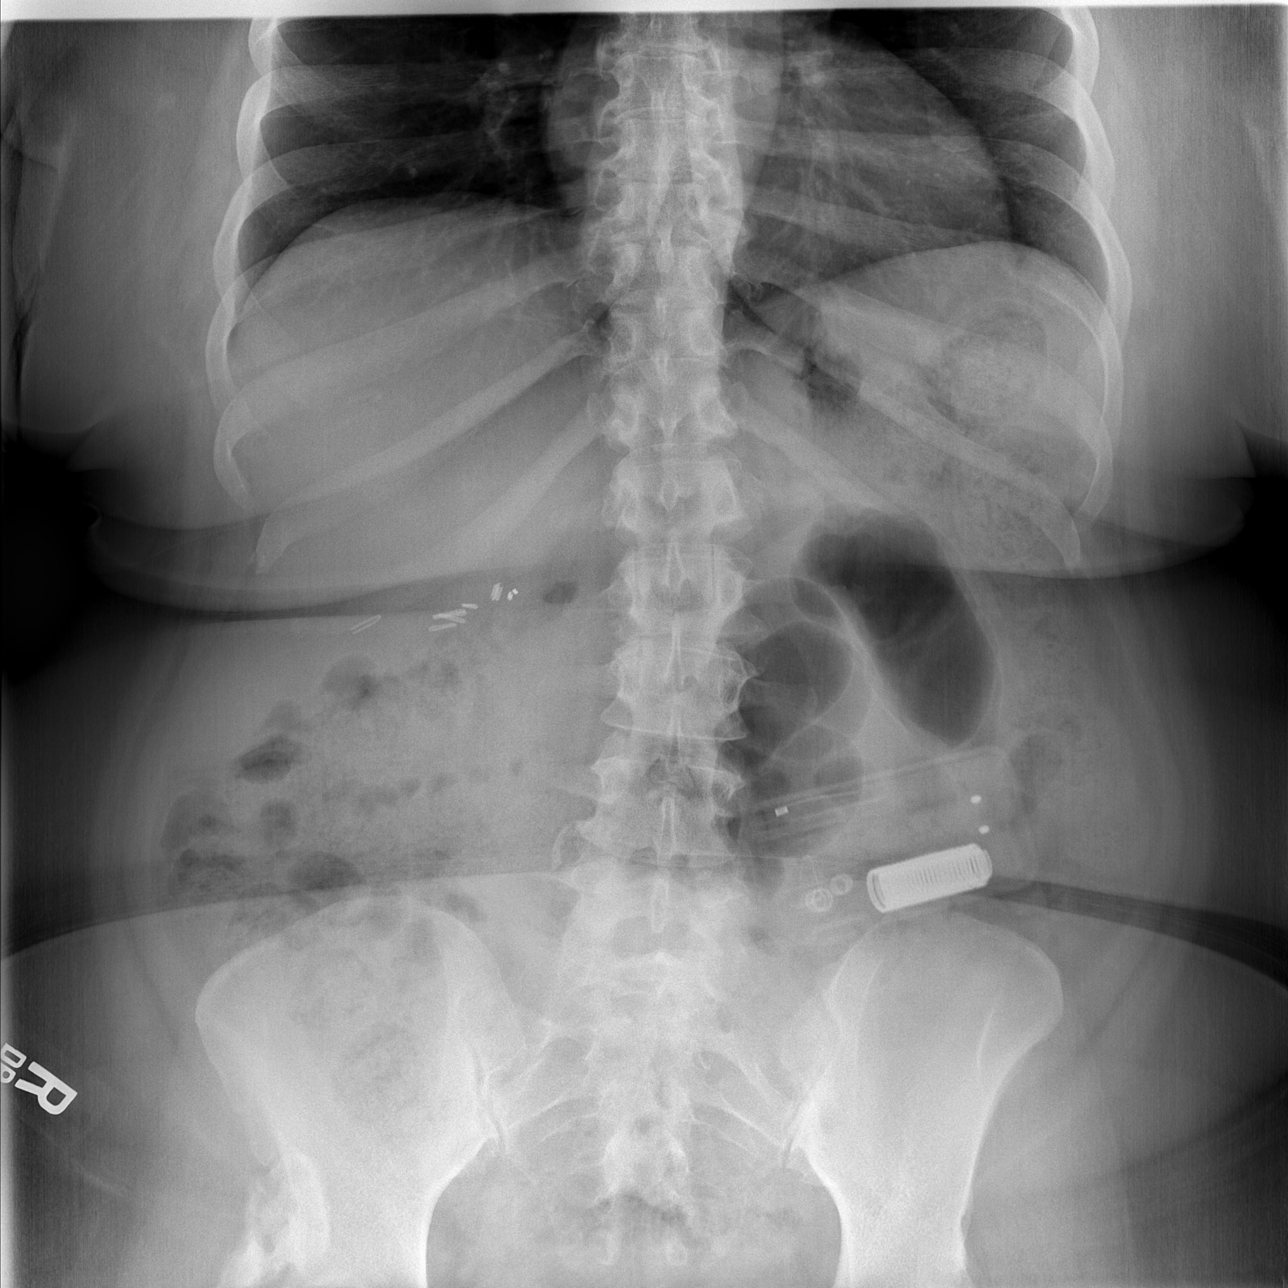

[t abdomen supine]
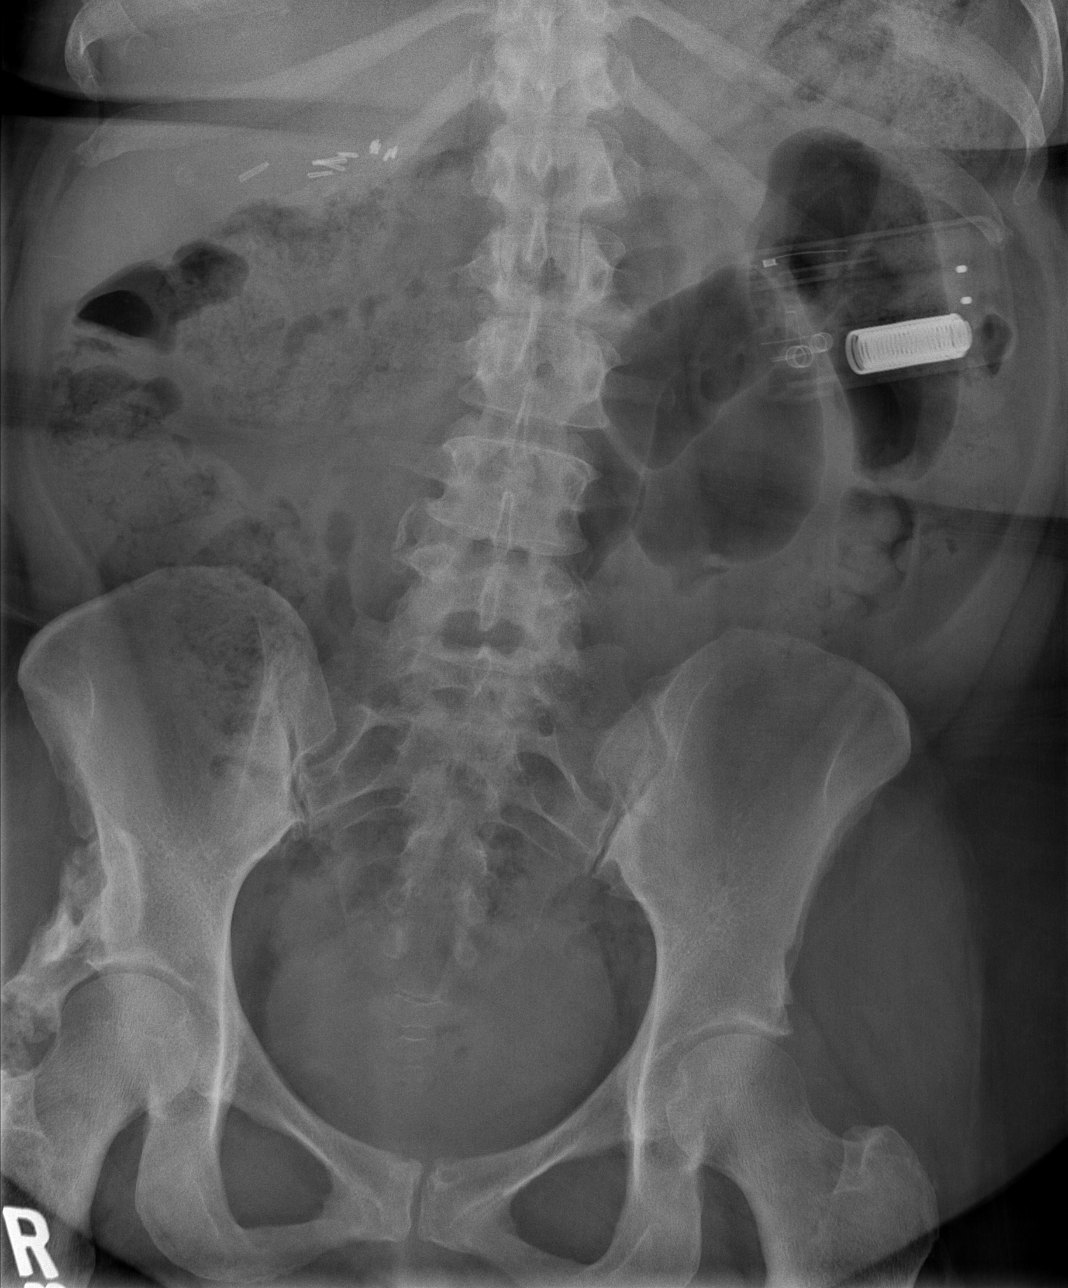

[3 of 3 positions shown; findings below may reference images not displayed]

FINDINGS: Mediastinum and hilar structures normal. Heart size normal. Mild
bibasilar subsegmental atelectasis. No pleural effusion or
pneumothorax. Soft tissue structures of the abdomen are
unremarkable. Surgical clips right upper quadrant. Moderate amount
of stool noted throughout the colon. Densities noted over the left
abdomen, this may be extrinsic to the patient. Stable deformity
noted of the right iliac wing.
IMPRESSION: 1.  Mild bibasilar subsegmental atelectasis.

2.  No acute intra- abdominal abnormality identified.

## 2018-04-08 ENCOUNTER — Other Ambulatory Visit: Payer: Self-pay | Admitting: Endocrinology

## 2018-04-08 NOTE — Telephone Encounter (Signed)
No longer on active med list. Refill or deny?

## 2018-04-08 NOTE — Telephone Encounter (Signed)
Has been on Farxiga, not clear why Invokana is being requested

## 2018-04-09 ENCOUNTER — Other Ambulatory Visit: Payer: Self-pay | Admitting: Endocrinology

## 2018-04-12 ENCOUNTER — Telehealth: Payer: Self-pay | Admitting: Endocrinology

## 2018-04-12 ENCOUNTER — Encounter: Payer: Self-pay | Admitting: Endocrinology

## 2018-04-12 NOTE — Telephone Encounter (Signed)
Patient called about message via MyChart "Hi Dr. Dwyane Dee, I need a note to be able to return to work/work from home effective 04/15/2018. I work for [employer], all High [employer] employees were sent home for 80 hrs, we now have the option to work from home but I need a medical note to return back due to Diabetes. I discussed this with you during our last appointment. I will pick the note up as soon as it is available so I can go into the office for training. Thanks [employer]"

## 2018-04-13 ENCOUNTER — Telehealth: Payer: Self-pay | Admitting: Endocrinology

## 2018-04-13 ENCOUNTER — Other Ambulatory Visit: Payer: Self-pay | Admitting: Endocrinology

## 2018-04-13 ENCOUNTER — Other Ambulatory Visit: Payer: Self-pay

## 2018-04-13 MED ORDER — GLUCOSE BLOOD VI STRP: ORAL_STRIP | 2 refills | Status: DC

## 2018-04-13 NOTE — Telephone Encounter (Signed)
Please advise 

## 2018-04-13 NOTE — Telephone Encounter (Signed)
Patient stated that she believes she is having some Symptoms from her Ozempic. She has been having pains in her stomach (lower right side and back) and can not keep anything down. Not even any liquids   Please advise

## 2018-04-13 NOTE — Telephone Encounter (Signed)
Notified patient of message from Dr. Dwyane Dee, patient expressed understanding and agreement. No further questions.  Patient stated she will call her PCP now.

## 2018-04-13 NOTE — Telephone Encounter (Signed)
She has been on Ozempic for quite some time so it is unlikely that this will cause GI symptoms suddenly.  She needs to call her PCP today regarding her symptoms

## 2018-04-13 NOTE — Telephone Encounter (Signed)
She can of course hold off the next injection of Ozempic until she is back to normal and then use 0.5 mg for couple of weeks before going back to 1 mg

## 2018-04-14 ENCOUNTER — Emergency Department (HOSPITAL_BASED_OUTPATIENT_CLINIC_OR_DEPARTMENT_OTHER): Payer: 59

## 2018-04-14 ENCOUNTER — Other Ambulatory Visit: Payer: Self-pay

## 2018-04-14 ENCOUNTER — Emergency Department (HOSPITAL_BASED_OUTPATIENT_CLINIC_OR_DEPARTMENT_OTHER)
Admission: EM | Admit: 2018-04-14 | Discharge: 2018-04-14 | Disposition: A | Discharge status: Home / Self Care | Payer: 59 | Attending: Emergency Medicine | Admitting: Emergency Medicine

## 2018-04-14 ENCOUNTER — Encounter (HOSPITAL_BASED_OUTPATIENT_CLINIC_OR_DEPARTMENT_OTHER): Payer: Self-pay

## 2018-04-14 DIAGNOSIS — Z7984 Long term (current) use of oral hypoglycemic drugs: Secondary | ICD-10-CM | POA: Insufficient documentation

## 2018-04-14 DIAGNOSIS — I10 Essential (primary) hypertension: Secondary | ICD-10-CM | POA: Insufficient documentation

## 2018-04-14 DIAGNOSIS — E119 Type 2 diabetes mellitus without complications: Secondary | ICD-10-CM | POA: Diagnosis not present

## 2018-04-14 DIAGNOSIS — Z87891 Personal history of nicotine dependence: Secondary | ICD-10-CM | POA: Diagnosis not present

## 2018-04-14 DIAGNOSIS — R1011 Right upper quadrant pain: Secondary | ICD-10-CM

## 2018-04-14 DIAGNOSIS — R109 Unspecified abdominal pain: Secondary | ICD-10-CM

## 2018-04-14 DIAGNOSIS — Z7982 Long term (current) use of aspirin: Secondary | ICD-10-CM | POA: Diagnosis not present

## 2018-04-14 DIAGNOSIS — R112 Nausea with vomiting, unspecified: Secondary | ICD-10-CM | POA: Insufficient documentation

## 2018-04-14 DIAGNOSIS — J45909 Unspecified asthma, uncomplicated: Secondary | ICD-10-CM | POA: Diagnosis not present

## 2018-04-14 DIAGNOSIS — Z79899 Other long term (current) drug therapy: Secondary | ICD-10-CM | POA: Insufficient documentation

## 2018-04-14 HISTORY — DX: Acute pancreatitis without necrosis or infection, unspecified: K85.90

## 2018-04-14 HISTORY — DX: Unspecified asthma, uncomplicated: J45.909

## 2018-04-14 LAB — URINALYSIS, ROUTINE W REFLEX MICROSCOPIC
Bilirubin Urine: NEGATIVE
Glucose, UA: >=500 mg/dL — AB
Ketones, ur: >80 mg/dL — AB
Leukocytes,Ua: NEGATIVE
Nitrite: NEGATIVE
Protein, ur: NEGATIVE mg/dL
Specific Gravity, Urine: <1.005 — ABNORMAL LOW (ref 1.005–1.030)
pH: 6 (ref 5.0–8.0)

## 2018-04-14 LAB — CBC WITH DIFFERENTIAL/PLATELET
Abs Immature Granulocytes: 0.03 10*3/uL (ref 0.00–0.07)
Basophils Absolute: 0 10*3/uL (ref 0.0–0.1)
Basophils Relative: 0 %
Eosinophils Absolute: 0 10*3/uL (ref 0.0–0.5)
Eosinophils Relative: 0 %
HCT: 48 % — ABNORMAL HIGH (ref 36.0–46.0)
Hemoglobin: 14.8 g/dL (ref 12.0–15.0)
Immature Granulocytes: 0 %
Lymphocytes Relative: 24 %
Lymphs Abs: 2.9 10*3/uL (ref 0.7–4.0)
MCH: 27.8 pg (ref 26.0–34.0)
MCHC: 30.8 g/dL (ref 30.0–36.0)
MCV: 90.2 fL (ref 80.0–100.0)
Monocytes Absolute: 0.8 10*3/uL (ref 0.1–1.0)
Monocytes Relative: 6 %
Neutro Abs: 8.5 10*3/uL — ABNORMAL HIGH (ref 1.7–7.7)
Neutrophils Relative %: 70 %
Platelets: ADEQUATE 10*3/uL (ref 150–400)
RBC: 5.32 MIL/uL — ABNORMAL HIGH (ref 3.87–5.11)
RDW: 13.6 % (ref 11.5–15.5)
Smear Review: NORMAL
WBC: 12.3 10*3/uL — ABNORMAL HIGH (ref 4.0–10.5)
nRBC: 0 % (ref 0.0–0.2)

## 2018-04-14 LAB — COMPREHENSIVE METABOLIC PANEL
ALT: 16 U/L (ref 0–44)
AST: 24 U/L (ref 15–41)
Albumin: 4.8 g/dL (ref 3.5–5.0)
Alkaline Phosphatase: 91 U/L (ref 38–126)
Anion gap: 16 — ABNORMAL HIGH (ref 5–15)
BUN: 23 mg/dL — ABNORMAL HIGH (ref 6–20)
CO2: 24 mmol/L (ref 22–32)
Calcium: 9.4 mg/dL (ref 8.9–10.3)
Chloride: 94 mmol/L — ABNORMAL LOW (ref 98–111)
Creatinine, Ser: 0.85 mg/dL (ref 0.44–1.00)
GFR calc Af Amer: >60 mL/min (ref >60)
GFR calc non Af Amer: >60 mL/min (ref >60)
Glucose, Bld: 158 mg/dL — ABNORMAL HIGH (ref 70–99)
Potassium: 4 mmol/L (ref 3.5–5.1)
Sodium: 134 mmol/L — ABNORMAL LOW (ref 135–145)
Total Bilirubin: 1.7 mg/dL — ABNORMAL HIGH (ref 0.3–1.2)
Total Protein: 9 g/dL — ABNORMAL HIGH (ref 6.5–8.1)

## 2018-04-14 LAB — URINALYSIS, MICROSCOPIC (REFLEX)

## 2018-04-14 LAB — LIPASE, BLOOD: Lipase: 29 U/L (ref 11–51)

## 2018-04-14 MED ORDER — SODIUM CHLORIDE 0.9 % IV BOLUS
500.0000 mL | Freq: Once | INTRAVENOUS | Status: AC
  Administered 2018-04-14: 500 mL via INTRAVENOUS

## 2018-04-14 MED ORDER — ONDANSETRON HCL 4 MG/2ML IJ SOLN
4.0000 mg | Freq: Once | INTRAMUSCULAR | Status: AC
  Administered 2018-04-14: 18:00:00 4 mg via INTRAVENOUS
  Filled 2018-04-14: qty 2

## 2018-04-14 MED ORDER — ONDANSETRON HCL 4 MG PO TABS: 4.0000 mg | ORAL_TABLET | Freq: Four times a day (QID) | ORAL | 0 refills | Status: DC

## 2018-04-14 MED ORDER — HYDROCODONE-ACETAMINOPHEN 5-325 MG PO TABS: 1.0000 | ORAL_TABLET | Freq: Four times a day (QID) | ORAL | 0 refills | Status: DC | PRN

## 2018-04-14 MED ORDER — ONDANSETRON HCL 4 MG/2ML IJ SOLN
4.0000 mg | Freq: Once | INTRAMUSCULAR | Status: AC
  Administered 2018-04-14: 4 mg via INTRAVENOUS
  Filled 2018-04-14: qty 2

## 2018-04-14 MED ORDER — IOHEXOL 300 MG/ML  SOLN
100.0000 mL | Freq: Once | INTRAMUSCULAR | Status: AC | PRN
  Administered 2018-04-14: 100 mL via INTRAVENOUS

## 2018-04-14 NOTE — Telephone Encounter (Signed)
Called pt and left detailed voicemail for pt with MD message.

## 2018-04-14 NOTE — ED Provider Notes (Signed)
[facility] Provider Note   CSN: [id number] Arrival date & time: 04/14/18  1313    History   Chief Complaint Chief Complaint  Patient presents with   Abdominal Pain    HPI Veronica Fletcher is a 53 y.o. female with history of hypertension, hyperlipidemia, diabetes, pancreatitis who presents with a 2-day history of abdominal pain, nausea, and vomiting.  She has pain to the right side of her abdomen and right flank.  She has not been able to keep food down and only tolerating sips of water.   She has not had a bowel movement since symptoms started.  She is not passing gas.  Reports she has been on Ozempic and last dose was 9 days ago.  She denies any fever, chest pain, shortness of breath, diarrhea, bloody stools, abnormal vaginal bleeding or discharge, or urinary symptoms.  Patient was sent from her PCP, [facility], for further evaluation and treatment.  Patient denies any saddle anesthesia, bowel or bladder incontinence, numbness or tingling to the lower extremities.     HPI  Past Medical History:  Diagnosis Date   Asthma    Diabetes mellitus    Hyperlipidemia    Hypertension    Pancreatitis     Patient Active Problem List   Diagnosis Date Noted   HTN (hypertension) 02/09/2013   Type II or unspecified type diabetes mellitus with unspecified complication, uncontrolled 02/09/2013   DKA (diabetic ketoacidoses) ([place]) 02/06/2013   Acute pancreatitis 02/06/2013   Menorrhagia 08/13/2011   Fibroids, submucosal 08/13/2011   Anemia 08/13/2011    Past Surgical History:  Procedure Laterality Date   ABDOMINAL HYSTERECTOMY     CESAREAN SECTION     CHOLECYSTECTOMY     GANGLION CYST EXCISION     LAPAROSCOPY       OB History   No obstetric history on file.      Home Medications    Prior to Admission medications   Medication Sig Start Date End Date Taking? Authorizing Provider  Multiple Vitamins-Minerals (MULTIVITAMIN WITH MINERALS)  tablet Take 1 tablet by mouth every morning. Patient takes Flinstones complete with Vitamin D   Yes [provider]  NOVOLOG MIX 70/30 FLEXPEN (70-30) 100 UNIT/ML FlexPen 18 units in the morning and 16 units at night 02/13/16  Yes [provider]  amLODipine (NORVASC) 5 MG tablet Take 5 mg by mouth daily. 12/02/15   [provider]  aspirin EC 81 MG tablet Take 81 mg by mouth every morning.    [provider]  atorvastatin (LIPITOR) 10 MG tablet TAKE 1 TABLET (10 MG TOTAL) BY MOUTH DAILY AT 6 PM. 10/19/17   Elayne Snare, MD  bisacodyl (EX-LAX ULTRA) 5 MG EC tablet Take 5 mg by mouth daily as needed for mild constipation or moderate constipation.    [provider]  Blood Glucose Monitoring Suppl ([place]) w/Device KIT 1 each by Does not apply route 4 (four) times daily. 10/04/15   Elayne Snare, MD  cyclobenzaprine (FLEXERIL) 10 MG tablet Take 10 mg by mouth as needed. 11/16/15   [provider]  dapagliflozin propanediol (FARXIGA) 10 MG TABS tablet Take 10 mg by mouth daily. 01/21/18   Elayne Snare, MD  diclofenac (VOLTAREN) 75 MG EC tablet TAKE 1 TABLET BY MOUTH TWICE A DAY AFTER MEALS FOR INFLAMMATION/PAIN/SWELLING TAKE AS NEEDED ONLY 03/28/16   [provider]  dicyclomine (BENTYL) 20 MG tablet Take 1 tablet (20 mg total) by mouth 2 (two) times  daily. 07/10/14   Charlann Lange, PA-C  Diphenhydramine-APAP, sleep, (GOODY PM PO) Take 1 packet by mouth at bedtime as needed. For pain and sleep    [provider]  gabapentin (NEURONTIN) 300 MG capsule Take 300 mg by mouth at bedtime. Takes 2 tablets at bedtime    [provider]  glucose blood (ONETOUCH VERIO) test strip Use as instructed to check blood sugar 4 times per day dx code E11.9 04/13/18   Elayne Snare, MD  HYDROcodone-acetaminophen (NORCO/VICODIN) 5-325 MG tablet Take 1 tablet by mouth every 6 (six) hours as needed for severe pain. 04/14/18   Marcel Sorter, Bea Graff,  PA-C  meloxicam (MOBIC) 7.5 MG tablet Take 7.5 mg by mouth daily.    [provider]  metFORMIN (GLUCOPHAGE-XR) 500 MG 24 hr tablet TAKE 4 TABLETS (2,000 MG TOTAL) BY MOUTH DAILY WITH SUPPER. 12/16/17   Elayne Snare, MD  methocarbamol (ROBAXIN) 500 MG tablet TAKE 1 TABLET BY MOUTH TWICE A DAY AS NEEDED FOR SPASM 03/28/16   [provider]  ondansetron (ZOFRAN) 4 MG tablet Take 1 tablet (4 mg total) by mouth every 6 (six) hours. 04/14/18   Dayton Kenley, Bea Graff, PA-C  ONETOUCH DELICA LANCETS 54S MISC Use to check sugar after meals and at bedtime 10/04/15   Elayne Snare, MD  OZEMPIC, 1 MG/DOSE, 2 MG/1.5ML SOPN INJECT 1 MG INTO THE SKIN ONCE A WEEK. 04/13/18   Elayne Snare, MD  ramipril (ALTACE) 5 MG capsule Take 1 capsule (5 mg total) by mouth daily. 10/05/15   Elayne Snare, MD  traMADol (ULTRAM) 50 MG tablet Take 50 mg by mouth every 12 (twelve) hours as needed.    [provider]    Family History Family History  Problem Relation Age of Onset   Diabetes Mother    Hypertension Mother    Diabetes Maternal Aunt    Diabetes Paternal Aunt    Diabetes Maternal Grandmother    Heart disease Neg Hx     Social History Social History   Tobacco Use   Smoking status: Former Smoker    Packs/day: 0.50    Types: Cigarettes    Last attempt to quit: 01/23/2013    Years since quitting: 5.2   Smokeless tobacco: Never Used  Substance Use Topics   Alcohol use: Yes    Comment: occ   Drug use: No     Allergies   Liraglutide   Review of Systems Review of Systems  Constitutional: Negative for chills and fever.  HENT: Negative for facial swelling and sore throat.   Respiratory: Negative for shortness of breath.   Cardiovascular: Negative for chest pain.  Gastrointestinal: Positive for abdominal pain, constipation, nausea and vomiting. Negative for blood in stool and diarrhea.  Genitourinary: Negative for dysuria.  Musculoskeletal: Positive for back pain.  Skin: Negative  for rash and wound.  Neurological: Negative for headaches.  Psychiatric/Behavioral: The patient is not nervous/anxious.      Physical Exam Updated Vital Signs BP (!) 151/66 (BP Location: Right Arm)    Pulse 99    Temp 98.7 F (37.1 C) (Oral)    Resp 16    Ht _0  (1.575 m)    Wt 85.3 kg    LMP 07/09/2011    SpO2 97%    BMI 34.39 kg/m   Physical Exam Vitals signs and nursing note reviewed.  Constitutional:      General: She is not in acute distress.    Appearance: She is well-developed. She is not  diaphoretic.  HENT:     Head: Normocephalic and atraumatic.     Mouth/Throat:     Pharynx: No oropharyngeal exudate.  Eyes:     General: No scleral icterus.       Right eye: No discharge.        Left eye: No discharge.     Conjunctiva/sclera: Conjunctivae normal.     Pupils: Pupils are equal, round, and reactive to light.  Neck:     Musculoskeletal: Normal range of motion and neck supple.     Thyroid: No thyromegaly.  Cardiovascular:     Rate and Rhythm: Normal rate and regular rhythm.     Heart sounds: Normal heart sounds. No murmur. No friction rub. No gallop.   Pulmonary:     Effort: Pulmonary effort is normal. No respiratory distress.     Breath sounds: Normal breath sounds. No stridor. No wheezing or rales.  Abdominal:     General: Bowel sounds are normal. There is no distension.     Palpations: Abdomen is soft.     Tenderness: There is abdominal tenderness in the right upper quadrant and right lower quadrant. There is no right CVA tenderness, left CVA tenderness, guarding or rebound. Positive signs include McBurney's sign.  Lymphadenopathy:     Cervical: No cervical adenopathy.  Skin:    General: Skin is warm and dry.     Coloration: Skin is not pale.     Findings: No rash.  Neurological:     Mental Status: She is alert.     Coordination: Coordination normal.      ED Treatments / Results  Labs (all labs ordered are listed, but only abnormal results are  displayed) Labs Reviewed  COMPREHENSIVE METABOLIC PANEL - Abnormal; Notable for the following components:      Result Value   Sodium 134 (*)    Chloride 94 (*)    Glucose, Bld 158 (*)    BUN 23 (*)    Total Protein 9.0 (*)    Total Bilirubin 1.7 (*)    Anion gap 16 (*)    All other components within normal limits  CBC WITH DIFFERENTIAL/PLATELET - Abnormal; Notable for the following components:   WBC 12.3 (*)    RBC 5.32 (*)    HCT 48.0 (*)    Neutro Abs 8.5 (*)    All other components within normal limits  URINALYSIS, ROUTINE W REFLEX MICROSCOPIC - Abnormal; Notable for the following components:   Specific Gravity, Urine <1.005 (*)    Glucose, UA >=500 (*)    Hgb urine dipstick SMALL (*)    Ketones, ur >80 (*)    All other components within normal limits  URINALYSIS, MICROSCOPIC (REFLEX) - Abnormal; Notable for the following components:   Bacteria, UA RARE (*)    All other components within normal limits  LIPASE, BLOOD    EKG None  Radiology Ct Abdomen Pelvis W Contrast  Result Date: 04/14/2018 CLINICAL DATA:  Acute right-sided abdominal pain. EXAM: CT ABDOMEN AND PELVIS WITH CONTRAST TECHNIQUE: Multidetector CT imaging of the abdomen and pelvis was performed using the standard protocol following bolus administration of intravenous contrast. CONTRAST:  115m OMNIPAQUE IOHEXOL 300 MG/ML  SOLN COMPARISON:  CT scan of July 21, 2014. FINDINGS: Lower chest: No acute abnormality. Hepatobiliary: No focal liver abnormality is seen. Status post cholecystectomy. No biliary dilatation. Pancreas: Unremarkable. No pancreatic ductal dilatation or surrounding inflammatory changes. Spleen: Normal in size without focal abnormality. Adrenals/Urinary Tract: Adrenal glands are unremarkable.  Kidneys are normal, without renal calculi, focal lesion, or hydronephrosis. Bladder is unremarkable. Stomach/Bowel: The stomach appears normal. There is no evidence of bowel obstruction or inflammation. The appendix  is not clearly visualized. However, no inflammatory changes are noted in the right lower quadrant. Vascular/Lymphatic: No significant vascular findings are present. No enlarged abdominal or pelvic lymph nodes. Reproductive: Status post hysterectomy. No adnexal masses. Other: No abdominal wall hernia or abnormality. No abdominopelvic ascites. Musculoskeletal: No acute or significant osseous findings. IMPRESSION: No acute abnormality seen in the abdomen or pelvis. Electronically Signed   By: Marijo Conception, M.D.   On: 04/14/2018 15:56    Procedures Procedures (including critical care time)  Medications Ordered in ED Medications  ondansetron (ZOFRAN) injection 4 mg (4 mg Intravenous Given 04/14/18 1433)  iohexol (OMNIPAQUE) 300 MG/ML solution 100 mL (100 mLs Intravenous Contrast Given 04/14/18 1527)  sodium chloride 0.9 % bolus 500 mL (0 mLs Intravenous Stopped 04/14/18 1818)  ondansetron (ZOFRAN) injection 4 mg (4 mg Intravenous Given 04/14/18 1809)     Initial Impression / Assessment and Plan / ED Course  I have reviewed the triage vital signs and the nursing notes.  Pertinent labs & imaging results that were available during my care of the patient were reviewed by me and considered in my medical decision making (see chart for details).        Patient presenting with a 2-day history of right-sided abdominal pain and flank pain as well as nausea and vomiting.  Patient denies any fever, cough, shortness of breath.  CT abdomen pelvis is negative, however appendix was not completely visualized however no signs of appendicitis.  CBC shows WBC 12.3.  Lipase 29.  CMP shows sodium 134, chloride 94, BUN 23, anion gap 16, total bilirubin 1.7.  Patient given IV fluids in the ED.  UA shows no infection, but greater than 80 ketones.  No other signs of DKA, CO2 WNL.  Glucose 158. Suspect dehydration as cause. Patient is tolerating oral fluids in the ED and would like to go home and drink oral fluids instead of  continue IV fluids here.  Suspect viral gastroenteritis with possible musculoskeletal component, as patient states she was raking the day before she developed symptoms.  Patient will be discharged home with Zofran and short course of Norco for pain control.  Patient advised to follow-up with PCP if symptoms are not improving over the next 2 days.  Return precautions discussed including intractable vomiting, persistent fever of 100.4, worsening, localized abdominal pain, or any other new or concerning symptoms.  She understands and agrees with plan.  Patient vitals stable and discharged in satisfactory condition. I discussed patient case with Dr. Tyrone Nine who guided the patient's management and agrees with plan.   Final Clinical Impressions(s) / ED Diagnoses   Final diagnoses:  Right upper quadrant abdominal pain  Non-intractable vomiting with nausea, unspecified vomiting type  Right flank pain    ED Discharge Orders         Ordered    ondansetron (ZOFRAN) 4 MG tablet  Every 6 hours     04/14/18 1808    HYDROcodone-acetaminophen (NORCO/VICODIN) 5-325 MG tablet  Every 6 hours PRN     04/14/18 1808           Frederica Kuster, PA-C 04/14/18 Tamala Fothergill, DO 04/14/18 1910

## 2018-04-14 NOTE — Discharge Instructions (Addendum)
Take Zofran every 6 hours as needed for nausea or vomiting.  For severe pain, take 1 Norco every 6 hours.  Do not drive or operate machinery while taking this medication.  Is important that you hydrate well.  Please touch base with your primary care provider in 2 days if your symptoms are not resolving.  Please return to the emergency department you develop any persistent fever 100.4, intractable vomiting, worsening localized abdominal pain, or any other new or concerning symptoms.  Do not drink alcohol, drive, operate machinery or participate in any other potentially dangerous activities while taking opiate pain medication as it may make you sleepy. Do not take this medication with any other sedating medications, either prescription or over-the-counter. If you were prescribed Percocet or Vicodin, do not take these with acetaminophen (Tylenol) as it is already contained within these medications and overdose of Tylenol is dangerous.   This medication is an opiate (or narcotic) pain medication and can be habit forming.  Use it as little as possible to achieve adequate pain control.  Do not use or use it with extreme caution if you have a history of opiate abuse or dependence. This medication is intended for your use only - do not give any to anyone else and keep it in a secure place where nobody else, especially children, have access to it. It will also cause or worsen constipation, so you may want to consider taking an over-the-counter stool softener while you are taking this medication.

## 2018-04-14 NOTE — ED Triage Notes (Signed)
C/o abd pain, n/v x 3 days-NAD-steady gait

## 2018-04-14 NOTE — ED Notes (Signed)
ED Provider at bedside. 

## 2018-04-15 ENCOUNTER — Other Ambulatory Visit: Payer: 59

## 2018-04-20 ENCOUNTER — Other Ambulatory Visit: Payer: Self-pay

## 2018-04-20 ENCOUNTER — Ambulatory Visit: Payer: 59 | Admitting: Endocrinology

## 2018-05-06 ENCOUNTER — Other Ambulatory Visit: Payer: Self-pay | Admitting: Endocrinology

## 2018-05-06 ENCOUNTER — Other Ambulatory Visit: Payer: Self-pay

## 2018-05-06 MED ORDER — SEMAGLUTIDE (1 MG/DOSE) 2 MG/1.5ML ~~LOC~~ SOPN: 1.0000 mg | PEN_INJECTOR | SUBCUTANEOUS | 1 refills | Status: DC

## 2018-05-27 ENCOUNTER — Encounter: Payer: Self-pay | Admitting: Physical Therapy

## 2018-05-27 ENCOUNTER — Other Ambulatory Visit: Payer: Self-pay

## 2018-05-27 ENCOUNTER — Ambulatory Visit: Payer: 59 | Attending: Family Medicine | Admitting: Physical Therapy

## 2018-05-27 DIAGNOSIS — M25511 Pain in right shoulder: Secondary | ICD-10-CM | POA: Diagnosis present

## 2018-05-27 DIAGNOSIS — M6281 Muscle weakness (generalized): Secondary | ICD-10-CM | POA: Diagnosis present

## 2018-05-27 DIAGNOSIS — R293 Abnormal posture: Secondary | ICD-10-CM | POA: Insufficient documentation

## 2018-05-27 DIAGNOSIS — M542 Cervicalgia: Secondary | ICD-10-CM | POA: Insufficient documentation

## 2018-05-27 NOTE — Therapy (Signed)
[facility] [address], [place], [place], [address] Phone: [phone]   Fax:  [phone]  Physical Therapy Evaluation  Patient Details  Name: Veronica Fletcher MRN: [id number] Date of Birth: 1965/01/15 Referring Provider (PT): Dr. Lawerance Cruel   Encounter Date: 05/27/2018  PT End of Session - 05/27/18 1433    Visit Number  1    Date for PT Re-Evaluation  07/22/18    Authorization Type  [employer]    Authorization - Visit Number  1    Authorization - Number of Visits  30    PT Start Time  4196    PT Stop Time  1430    PT Time Calculation (min)  45 min    Activity Tolerance  Patient tolerated treatment well;No increased pain    Behavior During Therapy  WFL for tasks assessed/performed       Past Medical History:  Diagnosis Date  . Asthma   . Diabetes mellitus   . Hyperlipidemia   . Hypertension   . Pancreatitis     Past Surgical History:  Procedure Laterality Date  . ABDOMINAL HYSTERECTOMY    . CESAREAN SECTION    . CHOLECYSTECTOMY    . GANGLION CYST EXCISION    . LAPAROSCOPY      There were no vitals filed for this visit.   Subjective Assessment - 05/27/18 1353    Subjective  Patient reports her right ear has been bothering her. Last Sunday she had trouble lifting her head up and pain started from right ear to right shoulder. 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     Patient Stated Goals  move the cervical more freely and reduce pain    Currently in Pain?  Yes    Pain Score  7     Pain Location  Neck   right ear and shoulder   Pain Orientation  Right    Pain Descriptors / Indicators  Shooting;Sharp;Aching    Pain Type  Acute pain    Pain Onset  More than a month ago    Pain Frequency  Intermittent    Aggravating Factors   typing on computer, laying down on right side, turning her head, look upward, reaching with right arm, sleeping    Pain Relieving Factors  heat, laying  on back    Multiple Pain Sites  No         [facility] PT Assessment - 05/27/18 0001      Assessment   Medical Diagnosis  M54.2 Cervicalgia    Referring Provider (PT)  Dr. Lawerance Cruel    Onset Date/Surgical Date  02/13/18    Prior Therapy  none      Precautions   Precautions  None      Restrictions   Weight Bearing Restrictions  No      Balance Screen   Has the patient fallen in the past 6 months  Yes    How many times?  1   tripped over cord in her room   Has the patient had a decrease in activity level because of a fear of falling?   No    Is the patient reluctant to leave their home because of a fear of falling?   No      Home Film/video editor residence      Prior Function   Level of Independence  Independent    Vocation  Full time employment    Vocation Requirements  typing and talking    Leisure  walks 3 times per week      Cognition   Overall Cognitive Status  Within Functional Limits for tasks assessed      Observation/Other Assessments   Focus on Therapeutic Outcomes (FOTO)   53% limitation; goal is 34% limitation      Posture/Postural Control   Posture/Postural Control  Postural limitations    Postural Limitations  Rounded Shoulders;Forward head;Increased thoracic kyphosis      ROM / Strength   AROM / PROM / Strength  AROM;PROM;Strength      AROM   Right Shoulder Flexion  150 Degrees    Right Shoulder ABduction  150 Degrees    Cervical Flexion  20    Cervical Extension  20   mostly subocciptial   Cervical - Right Side Bend  15    Cervical - Left Side Bend  15    Cervical - Right Rotation  27    Cervical - Left Rotation  32      Strength   Right Shoulder Flexion  3+/5    Right Shoulder ABduction  4-/5      Palpation   Spinal mobility  decreased C2-T5 vertebrae mobility; decreased right rib cage mobiliyt    Palpation comment  tenderness located on the right lateral pterygoid, bil. suboccipitals, cervical paraspinals,  upper trap, levator scapulae, right pectoralis                Objective measurements completed on examination: See above findings.      [place] Adult PT Treatment/Exercise - 05/27/18 0001      Therapeutic Activites    Therapeutic Activities  ADL's    ADL's  sitting posture to elongate her neck and keep her shoulders down, getting in and out of bed with rolling on her side      Manual Therapy   Manual Therapy  Soft tissue mobilization;Joint mobilization    Joint Mobilization  right rib cage; P-A mobilization of T2-T5    Soft tissue mobilization  right pectoralis, suboccipitals, upper trap, right lateral pterygoid, interscapula      Neck Exercises: Stretches   Upper Trapezius Stretch  Right;2 reps;20 seconds    Corner Stretch  2 reps;20 seconds    Other Neck Stretches  cervical retraction       Trigger Point Dry Needling - 05/27/18 0001    Consent Given?  Yes    Education Handout Provided  Yes    Muscles Treated Head and Neck  Upper trapezius;Suboccipitals;Cervical multifidi   right side          PT Education - 05/27/18 1432    Education Details  Access Code: [email or web address] ; information on dry needling    Person(s) Educated  Patient    Methods  Explanation;Demonstration;Verbal cues;Handout    Comprehension  Verbalized understanding;Returned demonstration       PT Short Term Goals - 05/27/18 1656      PT SHORT TERM GOAL #1   Title  independent with initial HEP     Time  4    Period  Weeks    Status  New    Target Date  06/24/18      PT SHORT TERM GOAL #2   Title  able to sit erect with shoulders depressed due to reduction of tissue tightness    Time  4    Period  Weeks    Status  New  Target Date  06/24/18      PT SHORT TERM GOAL #3   Title  able to turn her head with 25% greater ease due to improved mobility    Time  4    Period  Weeks    Status  New    Target Date  06/24/18      PT SHORT TERM GOAL #4   Title  able to work with 25% less pain due  to improved posture    Time  4    Period  Weeks    Status  New    Target Date  06/24/18        PT Long Term Goals - 05/27/18 1659      PT LONG TERM GOAL #1   Title  independent with HEP and understand how to progress herself    Time  8    Period  Weeks    Status  New    Target Date  07/22/18      PT LONG TERM GOAL #2   Title  able to work with pain decreased >/= 75% due to improved posture and ROM of cervical    Time  8    Period  Weeks    Status  New    Target Date  07/22/18      PT LONG TERM GOAL #3   Title  able to reach overhead with pain decreased >/= 75% and right shoulder strength 5/5    Time  8    Period  Days    Status  Achieved    Target Date  07/22/18      PT LONG TERM GOAL #4   Title  FOTO </= 34% limitation    Time  8    Period  Weeks    Status  New    Target Date  07/22/18      PT LONG TERM GOAL #5   Title  able to sleep through the night due to full cervical ROM and reduction of muscle spasms    Time  8    Period  Weeks    Status  New    Target Date  07/22/18             Plan - 05/27/18 1433    Clinical Impression Statement  Patient is a 53 year old female with sudden onset of right ear pain traveling to the right shoulder for the past 3 months. Patient reports her right cervical intermittent pain is level 7/10 with cervical movement, reaching with right arm and laying on right side. Cervical ROM is limited with pain. Patient has full right shoulder A/ROM but strength for flexion is 3+/5 and abduction is 4-/5. Patient has decreased mobility of thoracic and cervical vertebrae. Palpable tenderness in the right cervical musculature, lateral pterygoid on the right, right interscapular muscles, and right pectoralis. Patient posture consists of rounded shoulders, increased thoracic kyphosis, and forward head. Patient types on the computer during work with keeping her shoulders raised up and tensing the cervical muscles. Patient will benefit from skilled  therapy to cervical ROM, reduce muscle spasms and improve postural strength.     Personal Factors and Comorbidities  Comorbidity 1;Profession    Comorbidities  Diabetes, hypertension    Examination-Activity Limitations  Reach Overhead    Examination-Participation Restrictions  Driving    Clinical Decision Making  Moderate    Rehab Potential  Excellent    PT Frequency  2x / week  PT Duration  8 weeks    PT Treatment/Interventions  Cryotherapy;Electrical Stimulation;Moist Heat;Traction;Ultrasound;Therapeutic activities;Therapeutic exercise;Neuromuscular re-education;Manual techniques;Patient/family education;Passive range of motion;Dry needling;Taping;Spinal Manipulations    PT Next Visit Plan  dry needling to the right lateral pterygoid, suboccipitals, cervical multifidi, upper thoracic multifidi; cervical mobilizations, interscapular strength, cervical elongation and stretches    PT Home Exercise Plan  Access Code: [email or web address]     Consulted and Agree with Plan of Care  Patient       Patient will benefit from skilled therapeutic intervention in order to improve the following deficits and impairments:  Increased fascial restricitons, Pain, Decreased coordination, Decreased mobility, Increased muscle spasms, Decreased activity tolerance, Decreased endurance, Decreased range of motion, Impaired flexibility  Visit Diagnosis: Cervicalgia - Plan: PT plan of care cert/re-cert  Acute pain of right shoulder - Plan: PT plan of care cert/re-cert  Muscle weakness (generalized) - Plan: PT plan of care cert/re-cert  Abnormal posture - Plan: PT plan of care cert/re-cert     Problem List Patient Active Problem List   Diagnosis Date Noted  . HTN (hypertension) 02/09/2013  . Type II or unspecified type diabetes mellitus with unspecified complication, uncontrolled 02/09/2013  . DKA (diabetic ketoacidoses) ([place]) 02/06/2013  . Acute pancreatitis 02/06/2013  . Menorrhagia 08/13/2011  . Fibroids,  submucosal 08/13/2011  . Anemia 08/13/2011    Earlie Counts, PT 05/27/18 5:05 PM   [facility] [address] [place], [place], [address] Phone: [phone]   Fax:  [phone]  Name: Della Scrivener MRN: [id number] Date of Birth: 04/05/1965

## 2018-05-27 NOTE — Patient Instructions (Signed)
Access Code: [email or web address]  URL: [email or web address]  Date: 05/27/2018  Prepared by: Earlie Counts   Exercises  Neck Retraction - 1 reps - 1 sets - 1x daily - 7x weekly  Upper Trapezius Stretch - 1 reps - 1 sets - 15 sec hold - 3x daily - 7x weekly  Doorway Pec Stretch at 90 Degrees Abduction - 1 reps - 1 sets - 15 hold - 3x daily - 7x weekly  Patient Education  [facility] [address] [place], [place] [address] Phone # [phone] Fax [phone]

## 2018-06-14 ENCOUNTER — Other Ambulatory Visit: Payer: Self-pay

## 2018-06-14 ENCOUNTER — Ambulatory Visit: Payer: 59 | Attending: Family Medicine

## 2018-06-14 DIAGNOSIS — M25511 Pain in right shoulder: Secondary | ICD-10-CM | POA: Diagnosis present

## 2018-06-14 DIAGNOSIS — M542 Cervicalgia: Secondary | ICD-10-CM | POA: Diagnosis not present

## 2018-06-14 DIAGNOSIS — M6281 Muscle weakness (generalized): Secondary | ICD-10-CM | POA: Diagnosis present

## 2018-06-14 DIAGNOSIS — R293 Abnormal posture: Secondary | ICD-10-CM

## 2018-06-14 NOTE — Therapy (Addendum)
[facility] [address] [place], [place], [address] Phone: [phone]   Fax:  [phone]  Physical Therapy Treatment  Patient Details  Name: Veronica Fletcher MRN: [id number] Date of Birth: February 19, 1965 Referring Provider (PT): Dr. Lawerance Cruel   Encounter Date: 06/14/2018  PT End of Session - 06/14/18 1622    Visit Number  2    Date for PT Re-Evaluation  07/22/18    Authorization Type  [employer]    Authorization - Visit Number  2    Authorization - Number of Visits  30    PT Start Time  8466    PT Stop Time  5993    PT Time Calculation (min)  44 min    Activity Tolerance  Patient tolerated treatment well;No increased pain    Behavior During Therapy  WFL for tasks assessed/performed       Past Medical History:  Diagnosis Date  . Asthma   . Diabetes mellitus   . Hyperlipidemia   . Hypertension   . Pancreatitis     Past Surgical History:  Procedure Laterality Date  . ABDOMINAL HYSTERECTOMY    . CESAREAN SECTION    . CHOLECYSTECTOMY    . GANGLION CYST EXCISION    . LAPAROSCOPY      There were no vitals filed for this visit.  Subjective Assessment - 06/14/18 1536    Subjective  I felt better after dry needling last session.      Currently in Pain?  Yes    Pain Score  4     Pain Location  Neck    Pain Orientation  Right    Pain Descriptors / Indicators  Dull;Aching    Pain Onset  More than a month ago    Pain Frequency  Intermittent    Aggravating Factors   turning head, computer work, reaching up with Rt UE    Pain Relieving Factors  heat, pain medication         [facility] PT Assessment - 06/14/18 0001      AROM   Cervical Flexion  40    Cervical Extension  45    Cervical - Right Side Bend  30    Cervical - Left Side Bend  35    Cervical - Right Rotation  55    Cervical - Left Rotation  50                   [facility] Adult PT Treatment/Exercise - 06/14/18 0001      Exercises   Exercises  Neck;Shoulder      Manual Therapy   Manual Therapy  Soft tissue mobilization;Joint mobilization    Manual therapy comments  elongation to bil upper traps, cervical suboccipitals and paraspinals      Neck Exercises: Stretches   Upper Trapezius Stretch  Right;2 reps;20 seconds    Corner Stretch  2 reps;20 seconds    Other Neck Stretches  cervical retraction       Trigger Point Dry Needling - 06/14/18 0001    Consent Given?  Yes    Muscles Treated Head and Neck  Upper trapezius;Suboccipitals;Cervical multifidi   bilateral   Upper Trapezius Response  Twitch reponse elicited;Palpable increased muscle length    Cervical multifidi Response  Twitch reponse elicited;Palpable increased muscle length             PT Short Term Goals - 06/14/18 1538      PT SHORT TERM GOAL #1  Title  independent with initial HEP     Baseline  independent in HEP issued at evaluation    Time  4    Status  On-going        PT Long Term Goals - 05/27/18 1659      PT LONG TERM GOAL #1   Title  independent with HEP and understand how to progress herself    Time  8    Period  Weeks    Status  New    Target Date  07/22/18      PT LONG TERM GOAL #2   Title  able to work with pain decreased >/= 75% due to improved posture and ROM of cervical    Time  8    Period  Weeks    Status  New    Target Date  07/22/18      PT LONG TERM GOAL #3   Title  able to reach overhead with pain decreased >/= 75% and right shoulder strength 5/5    Time  8    Period  Days    Status  Achieved    Target Date  07/22/18      PT LONG TERM GOAL #4   Title  FOTO </= 34% limitation    Time  8    Period  Weeks    Status  New    Target Date  07/22/18      PT LONG TERM GOAL #5   Title  able to sleep through the night due to full cervical ROM and reduction of muscle spasms    Time  8    Period  Weeks    Status  New    Target Date  07/22/18            Plan - 06/14/18 1619    Clinical Impression  Statement  Pt with first time follow-up after evaluation.  Pt required max verbal and moderate tactile cues for correct demonstration of HEP.  Pt demonstrated substitutions and scapular elevation with flexibility exercises.  Pt with significant tension and trigger points in neck and upper traps. Pt demonstrated improved tissue mobility and cervical A/ROM after manual therapy today.  Pt was also able to perform cervical retractions correctly after manual therapy.  Pt will continue to benefit from skilled PT for strength, flexibility and manual therapy to address tension, trigger points and A/ROM.      Rehab Potential  Excellent    PT Frequency  2x / week    PT Duration  8 weeks    PT Treatment/Interventions  Cryotherapy;Electrical Stimulation;Moist Heat;Traction;Ultrasound;Therapeutic activities;Therapeutic exercise;Neuromuscular re-education;Manual techniques;Patient/family education;Passive range of motion;Dry needling;Taping;Spinal Manipulations    PT Next Visit Plan  assess response to dry needling, cervical mobilizations, begin postural strength, cervical stretches    PT Home Exercise Plan  Access Code: [email or web address]     Recommended Other Services  initial certification is signed    Consulted and Agree with Plan of Care  Patient       Patient will benefit from skilled therapeutic intervention in order to improve the following deficits and impairments:  Increased fascial restricitons, Pain, Decreased coordination, Decreased mobility, Increased muscle spasms, Decreased activity tolerance, Decreased endurance, Decreased range of motion, Impaired flexibility  Visit Diagnosis: Cervicalgia  Acute pain of right shoulder  Muscle weakness (generalized)  Abnormal posture     Problem List Patient Active Problem List   Diagnosis Date Noted  . HTN (hypertension) 02/09/2013  . Type II or unspecified  type diabetes mellitus with unspecified complication, uncontrolled 02/09/2013  . DKA (diabetic  ketoacidoses) ([place]) 02/06/2013  . Acute pancreatitis 02/06/2013  . Menorrhagia 08/13/2011  . Fibroids, submucosal 08/13/2011  . Anemia 08/13/2011   Sigurd Sos, PT 06/14/18 4:23 PM PHYSICAL THERAPY DISCHARGE SUMMARY  Visits from Start of Care: 2  Current functional level related to goals / functional outcomes: See above for current PT status.  Pt didn't return to PT after this session.  Pt was contacted and she is not able to come back to PT at this time.  She will contact the MD when she is able to return.   Remaining deficits: See above for most current status.     Education / Equipment: HEP Plan: Patient agrees to discharge.  Patient goals were not met. Patient is being discharged due to not returning since the last visit.  ?????        Sigurd Sos, PT 07/22/18 7:28 AM [facility] [address] [place], [place], [address] Phone: [phone]   Fax:  [phone]  Name: Veronica Fletcher MRN: [id number] Date of Birth: 1965-11-24

## 2018-06-17 ENCOUNTER — Ambulatory Visit: Payer: 59 | Admitting: Physical Therapy

## 2018-06-28 ENCOUNTER — Other Ambulatory Visit (INDEPENDENT_AMBULATORY_CARE_PROVIDER_SITE_OTHER): Payer: 59

## 2018-06-28 ENCOUNTER — Other Ambulatory Visit: Payer: Self-pay

## 2018-06-28 DIAGNOSIS — Z794 Long term (current) use of insulin: Secondary | ICD-10-CM | POA: Diagnosis not present

## 2018-06-28 DIAGNOSIS — E1165 Type 2 diabetes mellitus with hyperglycemia: Secondary | ICD-10-CM | POA: Diagnosis not present

## 2018-06-28 LAB — COMPREHENSIVE METABOLIC PANEL
ALT: 10 U/L (ref 0–35)
AST: 15 U/L (ref 0–37)
Albumin: 4.1 g/dL (ref 3.5–5.2)
Alkaline Phosphatase: 74 U/L (ref 39–117)
BUN: 9 mg/dL (ref 6–23)
CO2: 26 mEq/L (ref 19–32)
Calcium: 9 mg/dL (ref 8.4–10.5)
Chloride: 104 mEq/L (ref 96–112)
Creatinine, Ser: 0.76 mg/dL (ref 0.40–1.20)
GFR: 96.39 mL/min (ref >60.00)
Glucose, Bld: 115 mg/dL — ABNORMAL HIGH (ref 70–99)
Potassium: 3.8 mEq/L (ref 3.5–5.1)
Sodium: 140 mEq/L (ref 135–145)
Total Bilirubin: 0.7 mg/dL (ref 0.2–1.2)
Total Protein: 6.8 g/dL (ref 6.0–8.3)

## 2018-06-28 LAB — MICROALBUMIN / CREATININE URINE RATIO
Creatinine,U: 127.6 mg/dL
Microalb Creat Ratio: 0.8 mg/g (ref 0.0–30.0)
Microalb, Ur: 1.1 mg/dL (ref 0.0–1.9)

## 2018-06-28 LAB — HEMOGLOBIN A1C: Hgb A1c MFr Bld: 7.7 % — ABNORMAL HIGH (ref 4.6–6.5)

## 2018-07-01 ENCOUNTER — Other Ambulatory Visit: Payer: Self-pay

## 2018-07-01 ENCOUNTER — Encounter: Payer: Self-pay | Admitting: Endocrinology

## 2018-07-01 ENCOUNTER — Ambulatory Visit (INDEPENDENT_AMBULATORY_CARE_PROVIDER_SITE_OTHER): Payer: 59 | Admitting: Endocrinology

## 2018-07-01 VITALS — BP 140/72 | HR 105 | Ht 62.0 in | Wt 193.4 lb

## 2018-07-01 DIAGNOSIS — E1165 Type 2 diabetes mellitus with hyperglycemia: Secondary | ICD-10-CM | POA: Diagnosis not present

## 2018-07-01 DIAGNOSIS — Z794 Long term (current) use of insulin: Secondary | ICD-10-CM

## 2018-07-01 MED ORDER — ACCU-CHEK GUIDE W/DEVICE KIT: 1.0000 | PACK | Freq: Two times a day (BID) | 0 refills | Status: AC

## 2018-07-01 MED ORDER — ACCU-CHEK FASTCLIX LANCETS MISC: 1.0000 | Freq: Two times a day (BID) | 3 refills | Status: DC

## 2018-07-01 MED ORDER — ACCU-CHEK GUIDE VI STRP: ORAL_STRIP | 3 refills | Status: DC

## 2018-07-01 NOTE — Progress Notes (Signed)
Patient ID: Veronica Fletcher, female   DOB: 21-Apr-1965, 53 y.o.   MRN: [id number]           Reason for Appointment: Follow-up for Type 2 Diabetes  Referring physician: Harrington Challenger  History of Present Illness:          Date of diagnosis of type 2 diabetes mellitus: 2005       Background history:  She had been started on metformin initially and not clear what other treatment she had taken subsequently, records are being awaited today She apparently was doing very well with a combination of Victoza and metformin until 2015 and she thinks she was losing weight with this.  She had taken Victoza for several months at least However according to his lab records her best A1c in 2014 was only 7.9 Apparently she was taken off Victoza when she was admitted for abdominal pain presumed to be from pancreatitis in 01/2013 She was then started on premixed insulin and taken off metformin also on discharge. She was referred here for poorly controlled diabetes with A1c consistently around 10% On her initial consultation she was given metformin and Jardiance in addition to her insulin  V-go pump 20 unit basal and boluses 4 units previously stopped  Recent history:   INSULIN regimen is: Novolog Mix at meals: 18a.m.-16 before dinner   Non-insulin hypoglycemic drugs: Farxiga 10 mg daily, metformin ER 1000 mg daily Ozempic 1.0 mg weekly  Her A1c is last up to 7.9 and fructosamine is 293   Current management, blood sugar patterns and problems identified:  She has not used her One Touch Verio monitor because of lack of strips and possible new insurance denial and using a [employer] meter but unable to download this  She had some GI problems a couple of months ago and may not have taken Ozempic consistently at that time  However has gone back on her  Appears to have lost some weight  However difficult to know what her blood sugar patterns are since she has not kept a diary of her blood sugars and the generic monitor  did not have date and time  She thinks her blood sugars may have been higher at times when she had bronchitis, ear pain and also periodic back pain with readings as high as 300  However recently highest reading has been 211, not clear what time  She thinks she is trying to plan her meals well and eating healthy  Not able to do any exercise currently  Side effects from medications have been:?  Victoza caused pancreatitis  Compliance with the medical regimen: Good Hypoglycemia: None recently   Glucose monitoring:  done 0.5 times a day         Glucometer:  Verio   Blood Glucose readings by review of monitor showed a recent blood sugar range 107-211  Previous readings:  PRE-MEAL Fasting Lunch Dinner Bedtime Overall  Glucose range:  122-204  70-175  137, 198  46   Mean/median:  144  169   130   POST-MEAL PC Breakfast PC Lunch PC Dinner  Glucose range:    103, 116  Mean/median:        :Self-care: The diet that the patient has been following is: tries to limit portions.      Typical meal intake: Breakfast is oatmeal/yogurt, lunch is a grilled chicken, evening meal is meat and 2 vegetables, will have snacks on yogurt    Dinner at 6 pm  Dietician visit, most recent:2014               Exercise: not walking   Weight history:   Wt Readings from Last 3 Encounters:  07/01/18 193 lb 6.4 oz (87.7 kg)  04/14/18 188 lb (85.3 kg)  04/01/18 200 lb 12.8 oz (91.1 kg)    Glycemic control:   Lab Results  Component Value Date   HGBA1C 7.7 (H) 06/28/2018   HGBA1C 7.9 (H) 01/19/2018   HGBA1C 6.9 (H) 06/16/2017   Lab Results  Component Value Date   MICROALBUR 1.1 06/28/2018   LDLCALC 135 (H) 01/19/2018   CREATININE 0.76 06/28/2018   Lab Results  Component Value Date   MICRALBCREAT 0.8 06/28/2018    Lab Results  Component Value Date   FRUCTOSAMINE 293 (H) 03/22/2018   FRUCTOSAMINE 250 08/12/2017   FRUCTOSAMINE 290 (H) 06/16/2017   FRUCTOSAMINE 284 10/22/2016      Lab on 06/28/2018  Component Date Value Ref Range Status  . Microalb, Ur 06/28/2018 1.1  0.0 - 1.9 mg/dL Final  . Creatinine,U 06/28/2018 127.6  mg/dL Final  . Microalb Creat Ratio 06/28/2018 0.8  0.0 - 30.0 mg/g Final  . Sodium 06/28/2018 140  135 - 145 mEq/L Final  . Potassium 06/28/2018 3.8  3.5 - 5.1 mEq/L Final  . Chloride 06/28/2018 104  96 - 112 mEq/L Final  . CO2 06/28/2018 26  19 - 32 mEq/L Final  . Glucose, Bld 06/28/2018 115* 70 - 99 mg/dL Final  . BUN 06/28/2018 9  6 - 23 mg/dL Final  . Creatinine, Ser 06/28/2018 0.76  0.40 - 1.20 mg/dL Final  . Total Bilirubin 06/28/2018 0.7  0.2 - 1.2 mg/dL Final  . Alkaline Phosphatase 06/28/2018 74  39 - 117 U/L Final  . AST 06/28/2018 15  0 - 37 U/L Final  . ALT 06/28/2018 10  0 - 35 U/L Final  . Total Protein 06/28/2018 6.8  6.0 - 8.3 g/dL Final  . Albumin 06/28/2018 4.1  3.5 - 5.2 g/dL Final  . Calcium 06/28/2018 9.0  8.4 - 10.5 mg/dL Final  . GFR 06/28/2018 96.39  >60.00 mL/min Final  . Hgb A1c MFr Bld 06/28/2018 7.7* 4.6 - 6.5 % Final   Glycemic Control Guidelines for People with Diabetes:Non Diabetic:  <6%Goal of Therapy: <7%Additional Action Suggested:  >8%       Allergies as of 07/01/2018      Reactions   Liraglutide    Other reaction(s): Pancreatitis      Medication List       Accurate as of July 01, 2018  3:38 PM. If you have any questions, ask your nurse or doctor.        amLODipine 5 MG tablet Commonly known as: NORVASC Take 5 mg by mouth daily.   aspirin EC 81 MG tablet Take 81 mg by mouth every morning.   atorvastatin 10 MG tablet Commonly known as: LIPITOR TAKE 1 TABLET (10 MG TOTAL) BY MOUTH DAILY AT 6 PM.   cyclobenzaprine 10 MG tablet Commonly known as: FLEXERIL Take 10 mg by mouth as needed.   dapagliflozin propanediol 10 MG Tabs tablet Commonly known as: Farxiga Take 10 mg by mouth daily.   diclofenac 75 MG EC tablet Commonly known as: VOLTAREN TAKE 1 TABLET BY MOUTH TWICE A DAY AFTER  MEALS FOR INFLAMMATION/PAIN/SWELLING TAKE AS NEEDED ONLY   dicyclomine 20 MG tablet Commonly known as: BENTYL Take 1 tablet (20 mg total) by mouth 2 (two) times daily.  Ex-Lax Ultra 5 MG EC tablet Generic drug: bisacodyl Take 5 mg by mouth daily as needed for mild constipation or moderate constipation.   gabapentin 300 MG capsule Commonly known as: NEURONTIN Take 300 mg by mouth at bedtime. Takes 2 tablets at bedtime   glucose blood test strip Commonly known as: OneTouch Verio Use as instructed to check blood sugar 4 times per day dx code E11.9   GOODY PM PO Take 1 packet by mouth at bedtime as needed. For pain and sleep   HYDROcodone-acetaminophen 5-325 MG tablet Commonly known as: NORCO/VICODIN Take 1 tablet by mouth every 6 (six) hours as needed for severe pain.   meloxicam 7.5 MG tablet Commonly known as: MOBIC Take 7.5 mg by mouth daily.   metFORMIN 500 MG 24 hr tablet Commonly known as: GLUCOPHAGE-XR TAKE 4 TABLETS (2,000 MG TOTAL) BY MOUTH DAILY WITH SUPPER.   methocarbamol 500 MG tablet Commonly known as: ROBAXIN TAKE 1 TABLET BY MOUTH TWICE A DAY AS NEEDED FOR SPASM   multivitamin with minerals tablet Take 1 tablet by mouth every morning. Patient takes Flinstones complete with Vitamin D   NovoLOG Mix 70/30 FlexPen (70-30) 100 UNIT/ML FlexPen Generic drug: insulin aspart protamine - aspart 18 units in the morning and 16 units at night   ondansetron 4 MG tablet Commonly known as: ZOFRAN Take 1 tablet (4 mg total) by mouth every 6 (six) hours.   OneTouch Delica Lancets 90Z Misc Use to check sugar after meals and at bedtime   OneTouch Verio Flex System w/Device Kit 1 each by Does not apply route 4 (four) times daily.   ramipril 5 MG capsule Commonly known as: ALTACE Take 1 capsule (5 mg total) by mouth daily.   Semaglutide (1 MG/DOSE) 2 MG/1.5ML Sopn Commonly known as: Ozempic (1 MG/DOSE) Inject 1 mg into the skin once a week.   traMADol 50 MG  tablet Commonly known as: ULTRAM Take 50 mg by mouth every 12 (twelve) hours as needed.       Allergies:  Allergies  Allergen Reactions  . Liraglutide     Other reaction(s): Pancreatitis    Past Medical History:  Diagnosis Date  . Asthma   . Diabetes mellitus   . Hyperlipidemia   . Hypertension   . Pancreatitis     Past Surgical History:  Procedure Laterality Date  . ABDOMINAL HYSTERECTOMY    . CESAREAN SECTION    . CHOLECYSTECTOMY    . GANGLION CYST EXCISION    . LAPAROSCOPY      Family History  Problem Relation Age of Onset  . Diabetes Mother   . Hypertension Mother   . Diabetes Maternal Aunt   . Diabetes Paternal Aunt   . Diabetes Maternal Grandmother   . Heart disease Neg Hx     Social History:  reports that she quit smoking about 5 years ago. Her smoking use included cigarettes. She smoked 0.50 packs per day. She has never used smokeless tobacco. She reports current alcohol use. She reports that she does not use drugs.    Review of Systems    Lipid history: on Lipitor 10 mg, followed by PCP Last LDL was 75 done in 3/20    Lab Results  Component Value Date   CHOL 203 (H) 01/19/2018   HDL 49.60 01/19/2018   LDLCALC 135 (H) 01/19/2018   LDLDIRECT 75.0 03/22/2018   TRIG 93.0 01/19/2018   CHOLHDL 4 01/19/2018           Hypertension:  followed by PCP, currently on 3m amlodipine and 5 mg ramipril  This is followed by PCP    BP Readings from Last 3 Encounters:  07/01/18 140/72  04/14/18 (!) 151/66  04/01/18 140/78    Last eye exam 04/2016 and is due for follow-up  Most recent foot exam: March 2019  Review of Systems    Physical Examination:  BP 140/72 (BP Location: Left Arm, Patient Position: Sitting, Cuff Size: Normal)   Pulse (!) 105   Ht 5' 2" (1.575 m)   Wt 193 lb 6.4 oz (87.7 kg)   LMP 07/09/2011   SpO2 98%   BMI 35.37 kg/m     ASSESSMENT:  Diabetes type 2, insulin-dependent  See history of present illness for detailed  discussion of current diabetes management, blood sugar patterns and problems identified  Her A1c has been higher this year and still relatively high at 7.7  As discussed above difficult to know when her blood sugars have been high but some of her high readings have been due to intercurrent illnesses Without a proper blood sugar monitor download difficult to know her blood sugar patterns also May have some postprandial high readings at times especially with variability of action of her premixed insulin More recently has been regular with her Ozempic and this may be helping improve her sugars again  HYPERLIPIDEMIA: She has good control   HYPERTENSION: Blood pressure recently better controlled    PLAN:    She will start using an Accu-Chek meter which probably will be covered by her insurance Discussed blood sugar monitoring by rotation either fasting or 2 hours after meals and blood sugar targets Restart walking when able to Call if blood sugars are persistently high No change in insulin regimen or use of Ozempic, metformin or Farxiga  Her blood sugars are variably high may consider switching to basal bolus insulin regimen with 2 insulins    There are no Patient Instructions on file for this visit.    AElayne Snare6/18/2020, 3:38 PM   Note: This office note was prepared with Dragon voice recognition system technology. Any transcriptional errors that result from this process are unintentional.

## 2018-07-01 NOTE — Patient Instructions (Signed)
Check blood sugars on waking up 3 days a week  Also check blood sugars about 2 hours after meals and do this after different meals by rotation  Recommended blood sugar levels on waking up are 90-130 and about 2 hours after meal is 130-160  Please bring your blood sugar monitor to each visit, thank you   

## 2018-08-10 ENCOUNTER — Other Ambulatory Visit: Payer: Self-pay | Admitting: Orthopedic Surgery

## 2018-08-10 DIAGNOSIS — M25551 Pain in right hip: Secondary | ICD-10-CM

## 2018-08-19 ENCOUNTER — Other Ambulatory Visit: Payer: Self-pay | Admitting: Endocrinology

## 2018-08-20 ENCOUNTER — Ambulatory Visit
Admission: RE | Admit: 2018-08-20 | Discharge: 2018-08-20 | Disposition: A | Discharge status: Home / Self Care | Payer: 59 | Source: Ambulatory Visit | Attending: Orthopedic Surgery | Admitting: Orthopedic Surgery

## 2018-08-20 DIAGNOSIS — M25551 Pain in right hip: Secondary | ICD-10-CM

## 2018-09-27 ENCOUNTER — Ambulatory Visit: Payer: Self-pay | Admitting: Student

## 2018-09-27 DIAGNOSIS — M898X9 Other specified disorders of bone, unspecified site: Secondary | ICD-10-CM

## 2018-09-29 ENCOUNTER — Other Ambulatory Visit: Payer: Self-pay

## 2018-09-29 ENCOUNTER — Other Ambulatory Visit (INDEPENDENT_AMBULATORY_CARE_PROVIDER_SITE_OTHER): Payer: 59

## 2018-09-29 DIAGNOSIS — E1165 Type 2 diabetes mellitus with hyperglycemia: Secondary | ICD-10-CM | POA: Diagnosis not present

## 2018-09-29 DIAGNOSIS — Z794 Long term (current) use of insulin: Secondary | ICD-10-CM | POA: Diagnosis not present

## 2018-09-30 LAB — BASIC METABOLIC PANEL
BUN: 12 mg/dL (ref 6–23)
CO2: 25 mEq/L (ref 19–32)
Calcium: 9.4 mg/dL (ref 8.4–10.5)
Chloride: 105 mEq/L (ref 96–112)
Creatinine, Ser: 0.79 mg/dL (ref 0.40–1.20)
GFR: 92.09 mL/min (ref >60.00)
Glucose, Bld: 142 mg/dL — ABNORMAL HIGH (ref 70–99)
Potassium: 3.6 mEq/L (ref 3.5–5.1)
Sodium: 138 mEq/L (ref 135–145)

## 2018-09-30 LAB — MICROALBUMIN / CREATININE URINE RATIO
Creatinine,U: 74.8 mg/dL
Microalb Creat Ratio: 0.9 mg/g (ref 0.0–30.0)
Microalb, Ur: <0.7 mg/dL (ref 0.0–1.9)

## 2018-09-30 LAB — HEMOGLOBIN A1C: Hgb A1c MFr Bld: 7.1 % — ABNORMAL HIGH (ref 4.6–6.5)

## 2018-10-01 ENCOUNTER — Encounter: Payer: Self-pay | Admitting: Endocrinology

## 2018-10-01 ENCOUNTER — Ambulatory Visit: Payer: 59 | Admitting: Endocrinology

## 2018-10-01 ENCOUNTER — Ambulatory Visit (INDEPENDENT_AMBULATORY_CARE_PROVIDER_SITE_OTHER): Payer: 59 | Admitting: Endocrinology

## 2018-10-01 ENCOUNTER — Other Ambulatory Visit: Payer: Self-pay

## 2018-10-01 VITALS — BP 134/70 | HR 103 | Ht 62.0 in | Wt 191.8 lb

## 2018-10-01 DIAGNOSIS — Z23 Encounter for immunization: Secondary | ICD-10-CM | POA: Diagnosis not present

## 2018-10-01 DIAGNOSIS — Z794 Long term (current) use of insulin: Secondary | ICD-10-CM

## 2018-10-01 DIAGNOSIS — E1165 Type 2 diabetes mellitus with hyperglycemia: Secondary | ICD-10-CM

## 2018-10-01 NOTE — Progress Notes (Signed)
Patient ID: Veronica Fletcher, female   DOB: [birth year], 53 y.o.   MRN: [id number]           Reason for Appointment: Follow-up for Type 2 Diabetes  Referring physician: Harrington Challenger  History of Present Illness:          Date of diagnosis of type 2 diabetes mellitus: 2005       Background history:  She had been started on metformin initially and not clear what other treatment she had taken subsequently, records are being awaited today She apparently was doing very well with a combination of Victoza and metformin until 2015 and she thinks she was losing weight with this.  She had taken Victoza for several months at least However according to his lab records her best A1c in 2014 was only 7.9 Apparently she was taken off Victoza when she was admitted for abdominal pain presumed to be from pancreatitis in 01/2013 She was then started on premixed insulin and taken off metformin also on discharge. She was referred here for poorly controlled diabetes with A1c consistently around 10% On her initial consultation she was given metformin and Jardiance in addition to her insulin  V-go pump 20 unit basal and boluses 4 units previously stopped  Recent history:   INSULIN regimen is: Novolog Mix at meals: 18a.m.-16 before dinner   Non-insulin hypoglycemic drugs: Farxiga 10 mg daily, metformin ER 1000 mg daily Ozempic 1.0 mg weekly  Her A1c is 7.1 compared to 7.7   Current management, blood sugar patterns and problems identified:  She has not used her new Accu-Chek meter that was given last time and is still using her [employer] meter which cannot be downloaded and does not have a date and time on this  She thinks she is checking her blood sugars mostly breakfast and bedtime but not clear how often  Overall appears to be cutting back on her portions but surprisingly has not lost any weight despite continuing Farxiga and Ozempic  Insulin doses have been continued unchanged for some time  Touch Verio monitor  because of lack of strips and possible new insurance denial and using a [employer] meter but unable to download this  Also no hypoglycemia with taking premixed insulin twice a day except rarely  Not able to do any exercise because of back pain and right leg pain  Hypoglycemia: 60 once a few weeks ago  Side effects from medications have been:?  Victoza caused pancreatitis  Compliance with the medical regimen: Good   Glucose monitoring:  done 0.5 times a day         Glucometer:  Verio   Blood Glucose readings by review of monitor   showed a recent blood sugar range 112-163    :Self-care: The diet that the patient has been following is: tries to limit portions.      Typical meal intake: Breakfast is oatmeal/yogurt, lunch is a grilled chicken, evening meal is meat and 2 vegetables, will have snacks on yogurt    Dinner at 6 pm           Dietician visit, most recent:2014               Exercise: not walking at this time  Weight history:   Wt Readings from Last 3 Encounters:  10/01/18 191 lb 12.8 oz (87 kg)  07/01/18 193 lb 6.4 oz (87.7 kg)  04/14/18 188 lb (85.3 kg)    Glycemic control:   Lab Results  Component Value Date  HGBA1C 7.1 (H) 09/29/2018   HGBA1C 7.7 (H) 06/28/2018   HGBA1C 7.9 (H) 01/19/2018   Lab Results  Component Value Date   MICROALBUR <0.7 09/29/2018   LDLCALC 135 (H) 01/19/2018   CREATININE 0.79 09/29/2018   Lab Results  Component Value Date   MICRALBCREAT 0.9 09/29/2018    Lab Results  Component Value Date   FRUCTOSAMINE 293 (H) 03/22/2018   FRUCTOSAMINE 250 08/12/2017   FRUCTOSAMINE 290 (H) 06/16/2017   FRUCTOSAMINE 284 10/22/2016     Lab on 09/29/2018  Component Date Value Ref Range Status  . Microalb, Ur 09/29/2018 <0.7  0.0 - 1.9 mg/dL Final  . Creatinine,U 09/29/2018 74.8  mg/dL Final  . Microalb Creat Ratio 09/29/2018 0.9  0.0 - 30.0 mg/g Final  . Sodium 09/29/2018 138  135 - 145 mEq/L Final  . Potassium 09/29/2018 3.6  3.5 - 5.1  mEq/L Final  . Chloride 09/29/2018 105  96 - 112 mEq/L Final  . CO2 09/29/2018 25  19 - 32 mEq/L Final  . Glucose, Bld 09/29/2018 142* 70 - 99 mg/dL Final  . BUN 09/29/2018 12  6 - 23 mg/dL Final  . Creatinine, Ser 09/29/2018 0.79  0.40 - 1.20 mg/dL Final  . Calcium 09/29/2018 9.4  8.4 - 10.5 mg/dL Final  . GFR 09/29/2018 92.09  >60.00 mL/min Final  . Hgb A1c MFr Bld 09/29/2018 7.1* 4.6 - 6.5 % Final   Glycemic Control Guidelines for People with Diabetes:Non Diabetic:  <6%Goal of Therapy: <7%Additional Action Suggested:  >8%       Allergies as of 10/01/2018      Reactions   Liraglutide    Other reaction(s): Pancreatitis      Medication List       Accurate as of October 01, 2018 10:52 AM. If you have any questions, ask your nurse or doctor.        Accu-Chek FastClix Lancets Misc 1 each by Does not apply route 2 (two) times a day. Use Accu Chek Fastclix lancets to check blood sugar twice daily.   Accu-Chek Guide test strip Generic drug: glucose blood Use Accu Chek Guide test strips as instructed to check blood sugar twice daily.   Accu-Chek Guide w/Device Kit 1 each by Does not apply route 2 (two) times a day. Use as instructed to check blood sugar twice daily.   amLODipine 5 MG tablet Commonly known as: NORVASC Take 5 mg by mouth daily.   aspirin EC 81 MG tablet Take 81 mg by mouth every morning.   atorvastatin 10 MG tablet Commonly known as: LIPITOR TAKE 1 TABLET (10 MG TOTAL) BY MOUTH DAILY AT 6 PM.   cyclobenzaprine 10 MG tablet Commonly known as: FLEXERIL Take 10 mg by mouth as needed.   dapagliflozin propanediol 10 MG Tabs tablet Commonly known as: Farxiga Take 10 mg by mouth daily.   Farxiga 5 MG Tabs tablet Generic drug: dapagliflozin propanediol TAKE 1 TABLET BY MOUTH EVERY DAY   diclofenac 75 MG EC tablet Commonly known as: VOLTAREN TAKE 1 TABLET BY MOUTH TWICE A DAY AFTER MEALS FOR INFLAMMATION/PAIN/SWELLING TAKE AS NEEDED ONLY   dicyclomine  20 MG tablet Commonly known as: BENTYL Take 1 tablet (20 mg total) by mouth 2 (two) times daily.   Ex-Lax Ultra 5 MG EC tablet Generic drug: bisacodyl Take 5 mg by mouth daily as needed for mild constipation or moderate constipation.   gabapentin 300 MG capsule Commonly known as: NEURONTIN Take 300 mg by mouth at bedtime.  Takes 2 tablets at bedtime   GOODY PM PO Take 1 packet by mouth at bedtime as needed. For pain and sleep   HYDROcodone-acetaminophen 5-325 MG tablet Commonly known as: NORCO/VICODIN Take 1 tablet by mouth every 6 (six) hours as needed for severe pain.   meloxicam 7.5 MG tablet Commonly known as: MOBIC Take 7.5 mg by mouth daily.   metFORMIN 500 MG 24 hr tablet Commonly known as: GLUCOPHAGE-XR TAKE 4 TABLETS (2,000 MG TOTAL) BY MOUTH DAILY WITH SUPPER. What changed: additional instructions   methocarbamol 500 MG tablet Commonly known as: ROBAXIN TAKE 1 TABLET BY MOUTH TWICE A DAY AS NEEDED FOR SPASM   multivitamin with minerals tablet Take 1 tablet by mouth every morning. Patient takes Flinstones complete with Vitamin D   NovoLOG Mix 70/30 FlexPen (70-30) 100 UNIT/ML FlexPen Generic drug: insulin aspart protamine - aspart 18 units in the morning and 16 units at night   ondansetron 4 MG tablet Commonly known as: ZOFRAN Take 1 tablet (4 mg total) by mouth every 6 (six) hours.   ramipril 5 MG capsule Commonly known as: ALTACE Take 1 capsule (5 mg total) by mouth daily.   Semaglutide (1 MG/DOSE) 2 MG/1.5ML Sopn Commonly known as: Ozempic (1 MG/DOSE) Inject 1 mg into the skin once a week.   traMADol 50 MG tablet Commonly known as: ULTRAM Take 50 mg by mouth every 12 (twelve) hours as needed.       Allergies:  Allergies  Allergen Reactions  . Liraglutide     Other reaction(s): Pancreatitis    Past Medical History:  Diagnosis Date  . Asthma   . Diabetes mellitus   . Hyperlipidemia   . Hypertension   . Pancreatitis     Past Surgical  History:  Procedure Laterality Date  . ABDOMINAL HYSTERECTOMY    . CESAREAN SECTION    . CHOLECYSTECTOMY    . GANGLION CYST EXCISION    . LAPAROSCOPY      Family History  Problem Relation Age of Onset  . Diabetes Mother   . Hypertension Mother   . Diabetes Maternal Aunt   . Diabetes Paternal Aunt   . Diabetes Maternal Grandmother   . Heart disease Neg Hx     Social History:  reports that she quit smoking about 5 years ago. Her smoking use included cigarettes. She smoked 0.50 packs per day. She has never used smokeless tobacco. She reports current alcohol use. She reports that she does not use drugs.    Review of Systems    Lipid history: on Lipitor 10 mg, followed by PCP Last LDL was 75 done in 3/20    Lab Results  Component Value Date   CHOL 203 (H) 01/19/2018   HDL 49.60 01/19/2018   LDLCALC 135 (H) 01/19/2018   LDLDIRECT 75.0 03/22/2018   TRIG 93.0 01/19/2018   CHOLHDL 4 01/19/2018           Hypertension:  followed by PCP, currently on 59m amlodipine and 5 mg ramipril  This is followed by PCP    BP Readings from Last 3 Encounters:  10/01/18 134/70  07/01/18 140/72  04/14/18 (!) 151/66    Last eye exam 04/2016 and is due for follow-up  Most recent foot exam: March 2019  She is due for orthopedic surgery soon  Review of Systems    Physical Examination:  BP 134/70 (BP Location: Left Arm, Patient Position: Sitting, Cuff Size: Normal)   Pulse (!) 103   Ht 5' 2"  (  1.575 m)   Wt 191 lb 12.8 oz (87 kg)   LMP 07/09/2011   SpO2 98%   BMI 35.08 kg/m     ASSESSMENT:  Diabetes type 2, insulin-dependent  See history of present illness for detailed discussion of current diabetes management, blood sugar patterns and problems identified  Her A1c has been higher this year However it is much better at 7.1  Most likely her blood sugars are better with her recently cutting back on portions and readings generally smaller meals with less carbohydrate She is  compliant with her other medications including Ozempic also At home blood sugars are mostly in the 120s-130s with occasionally high readings possibly at night but with her meter not having the daytime time difficult to know what her patterns are  HYPERLIPIDEMIA: She has good control as of 3/20   HYPERTENSION: Blood pressure is well controlled and renal function is stable    PLAN:    She will start using the Accu-Chek meter which was provided Discussed timing of glucose monitoring and targets She will be able to start walking hopefully after her surgery  No change in insulin regimen or use of Ozempic, metformin or Farxiga  Follow-up in 3 months  Influenza vaccine given  There are no Patient Instructions on file for this visit.    Elayne Snare 10/01/2018, 10:52 AM   Note: This office note was prepared with Dragon voice recognition system technology. Any transcriptional errors that result from this process are unintentional.

## 2018-10-01 NOTE — Patient Instructions (Signed)
Check blood sugars on waking up 3 days a week  Also check blood sugars about 2 hours after meals and do this after different meals by rotation  Recommended blood sugar levels on waking up are 90-130 and about 2 hours after meal is 130-160  Please bring your blood sugar monitor to each visit, thank you  Use One touch meter

## 2018-10-11 NOTE — Progress Notes (Addendum)
[id number] [address], [place] [address] Phone: [phone] Fax: [phone]    Your procedure is scheduled on Thursday, October 1st.  Report to [facility] Main Entrance "A" at 5:30 A.M., and check in at the Admitting office.  Call this number if you have problems the morning of surgery:  [phone]  Call [phone] if you have any questions prior to your surgery date Monday-Friday 8am-4pm   Remember:  Do not eat after midnight the night before your surgery   Take these medicines the morning of surgery with A SIP OF WATER  amLODipine (NORVASC)   If needed - albuterol (VENTOLIN HFA)/inhaler: bring with you the day of surgery albuterol (VENTOLIN HFA)/nebulizer, fluticasone (FLONASE), traMADol (ULTRAM)  You may drink clear liquids until 4:30 A.M. the morning of your surgery.   Clear liquids allowed are: Water, Non-Citrus Juices (without pulp), Carbonated Beverages, Clear Tea, Black Coffee Only, and Gatorade   Please complete your PRE-SURGERY G2 that was provided to you by 4:30 A.M the morning of surgery. Please, if able, drink it in one setting. DO NOT SIP.  7 days prior to surgery STOP taking any Aspirin (unless otherwise instructed by your surgeon), Aleve, Naproxen, Ibuprofen, Motrin, Advil, Goody's, BC's, all herbal medications, fish oil, and all vitamins.  How to Manage Your Diabetes Before and After Surgery  Why is it important to control my blood sugar before and after surgery? . Improving blood sugar levels before and after surgery helps healing and can limit problems. . A way of improving blood sugar control is eating a healthy diet by: o  Eating less sugar and carbohydrates o  Increasing activity/exercise o  Talking with your doctor about reaching your blood sugar goals . High blood sugars (greater than 180 mg/dL) can raise your risk of infections and slow your recovery,  so you will need to focus on controlling your diabetes during the weeks before surgery. . Make sure that the doctor who takes care of your diabetes knows about your planned surgery including the date and location.  How do I manage my blood sugar before surgery? . Check your blood sugar at least 4 times a day, starting 2 days before surgery, to make sure that the level is not too high or low. o Check your blood sugar the morning of your surgery when you wake up and every 2 hours until you get to the Short Stay unit. . If your blood sugar is less than 70 mg/dL, you will need to treat for low blood sugar: o Do not take insulin. o Treat a low blood sugar (less than 70 mg/dL) with  cup of clear juice (cranberry or apple), 4 glucose tablets, OR glucose gel. Recheck blood sugar in 15 minutes after treatment (to make sure it is greater than 70 mg/dL). If your blood sugar is not greater than 70 mg/dL on recheck, call [phone] o  for further instructions. . Report your blood sugar to the short stay nurse when you get to Short Stay.  . If you are admitted to the hospital after surgery: o Your blood sugar will be checked by the staff and you will probably be given insulin after surgery (instead of oral diabetes medicines) to make sure you have good blood sugar levels. o The goal for blood sugar control after surgery is 80-180 mg/dL.  WHAT DO I DO ABOUT MY DIABETES MEDICATION?  . Do NOT  Take  FARXIGA the day before surgery .  Marland Kitchen Do not take FARXIGA, or metFORMIN (GLUCOPHAGE-XR),   oral diabetes medicines (pills) the morning of surgery.  . THE NIGHT BEFORE SURGERY, take 11  units of NOVOLOG MIX 70/30 FLEXPEN _insulin.  . THE MORNING OF SURGERY, take do NOT take NOVOLOG MIX 70/30 FLEXPEN insulin.  . The day of surgery, do not take other diabetes injectables, including Semaglutide ,Byetta (exenatide), Bydureon (exenatide ER), Victoza (liraglutide), or Trulicity (dulaglutide).  Reviewed and Endorsed  by [facility] Patient Education Committee, August 2015  The Morning of Surgery  Do not wear jewelry, make-up or nail polish.  Do not wear lotions, powders, or perfumes, or deodorant  Do not shave 48 hours prior to surgery.  .  Do not bring valuables to the hospital.  [facility] is not responsible for any belongings or valuables.  If you are a smoker, DO NOT Smoke 24 hours prior to surgery IF you wear a CPAP at night please bring your mask, tubing, and machine the morning of surgery   Remember that you must have someone to transport you home after your surgery, and remain with you for 24 hours if you are discharged the same day.  Contacts, glasses, hearing aids, dentures or bridgework may not be worn into surgery.   Leave your suitcase in the car.  After surgery it may be brought to your room.  For patients admitted to the hospital, discharge time will be determined by your treatment team.  Patients discharged the day of surgery will not be allowed to drive home.   Special instructions:   [place]- Preparing For Surgery  Before surgery, you can play an important role. Because skin is not sterile, your skin needs to be as free of germs as possible. You can reduce the number of germs on your skin by washing with CHG (chlorahexidine gluconate) Soap before surgery.  CHG is an antiseptic cleaner which kills germs and bonds with the skin to continue killing germs even after washing.    Oral Hygiene is also important to reduce your risk of infection.  Remember - BRUSH YOUR TEETH THE MORNING OF SURGERY WITH YOUR REGULAR TOOTHPASTE  Please do not use if you have an allergy to CHG or antibacterial soaps. If your skin becomes reddened/irritated stop using the CHG.  Do not shave (including legs and underarms) for at least 48 hours prior to first CHG shower. It is OK to shave your face.  Please follow these instructions carefully.   1. Shower the NIGHT BEFORE SURGERY and the MORNING OF  SURGERY with CHG Soap.   2. If you chose to wash your hair, wash your hair first as usual with your normal shampoo.  3. After you shampoo, rinse your hair and body thoroughly to remove the shampoo.  4. Use CHG as you would any other liquid soap. You can apply CHG directly to the skin and wash gently with a scrungie or a clean washcloth.   5. Apply the CHG Soap to your body ONLY FROM THE NECK DOWN.  Do not use on open wounds or open sores. Avoid contact with your eyes, ears, mouth and genitals (private parts). Wash Face and genitals (private parts)  with your normal soap.   6. Wash thoroughly, paying special attention to the area where your surgery will be performed.  7. Thoroughly rinse your body with warm water from the neck down.  8. DO NOT shower/wash with your normal soap after using and rinsing  off the CHG Soap.  9. Pat yourself dry with a CLEAN TOWEL.  10. Wear CLEAN PAJAMAS to bed the night before surgery, wear comfortable clothes the morning of surgery  11. Place CLEAN SHEETS on your bed the night of your first shower and DO NOT SLEEP WITH PETS.  Day of Surgery: Do not apply any deodorants/lotions. Please shower the morning of surgery with the CHG soap  Please wear clean clothes to the hospital/surgery center.   Remember to brush your teeth WITH YOUR REGULAR TOOTHPASTE.  Please read over the following fact sheets that you were given.

## 2018-10-12 ENCOUNTER — Encounter (HOSPITAL_COMMUNITY): Payer: Self-pay

## 2018-10-12 ENCOUNTER — Encounter (HOSPITAL_COMMUNITY)
Admission: RE | Admit: 2018-10-12 | Discharge: 2018-10-12 | Disposition: A | Discharge status: Home / Self Care | Payer: 59 | Source: Ambulatory Visit | Attending: Student | Admitting: Student

## 2018-10-12 ENCOUNTER — Other Ambulatory Visit (HOSPITAL_COMMUNITY)
Admission: RE | Admit: 2018-10-12 | Discharge: 2018-10-12 | Disposition: A | Discharge status: Home / Self Care | Payer: 59 | Source: Ambulatory Visit | Attending: Student | Admitting: Student

## 2018-10-12 ENCOUNTER — Other Ambulatory Visit: Payer: Self-pay

## 2018-10-12 HISTORY — DX: Pneumonia, unspecified organism: J18.9

## 2018-10-12 HISTORY — DX: Cardiac murmur, unspecified: R01.1

## 2018-10-12 LAB — CBC
HCT: 42.1 % (ref 36.0–46.0)
Hemoglobin: 13 g/dL (ref 12.0–15.0)
MCH: 29.1 pg (ref 26.0–34.0)
MCHC: 30.9 g/dL (ref 30.0–36.0)
MCV: 94.4 fL (ref 80.0–100.0)
Platelets: 332 10*3/uL (ref 150–400)
RBC: 4.46 MIL/uL (ref 3.87–5.11)
RDW: 14.4 % (ref 11.5–15.5)
WBC: 8.6 10*3/uL (ref 4.0–10.5)
nRBC: 0 % (ref 0.0–0.2)

## 2018-10-12 LAB — BASIC METABOLIC PANEL
Anion gap: 10 (ref 5–15)
BUN: 9 mg/dL (ref 6–20)
CO2: 24 mmol/L (ref 22–32)
Calcium: 9.1 mg/dL (ref 8.9–10.3)
Chloride: 104 mmol/L (ref 98–111)
Creatinine, Ser: 0.88 mg/dL (ref 0.44–1.00)
GFR calc Af Amer: >60 mL/min (ref >60)
GFR calc non Af Amer: >60 mL/min (ref >60)
Glucose, Bld: 159 mg/dL — ABNORMAL HIGH (ref 70–99)
Potassium: 4.3 mmol/L (ref 3.5–5.1)
Sodium: 138 mmol/L (ref 135–145)

## 2018-10-12 LAB — SARS CORONAVIRUS 2 (TAT 6-24 HRS): SARS Coronavirus 2: NEGATIVE

## 2018-10-12 NOTE — Progress Notes (Signed)
PCP - Lawerance Cruel, MD Cardiologist - N/A  PPM/ICD - N/A Device Orders - N/A Rep Notified N/A  Chest x-ray - N/A EKG - 10/12/2018 Stress Test - N/A ECHO - 20 + years ago Cardiac Cath - N/A  Sleep Study - N/A CPAP - N/A  Fasting Blood Sugar - 100-130 Checks Blood Sugar __2__ times a day  Blood Thinner Instructions: N/A Aspirin Instructions: Stop taking 7 days prior to surgery. Last dose 10/11/2018. Patient instructed to contact her doctor's office to clarify when to stop ASA.  ERAS Protcol - Yes, G2 PRE-SURGERY Ensure - N/A  COVID TEST- 10/12/2018   Anesthesia review: no  Patient denies shortness of breath, fever, cough and chest pain at PAT appointment   Coronavirus Screening  Have you experienced the following symptoms:  Cough yes/no: No Fever (>100.54F)  yes/no: No Runny nose yes/no: No Sore throat yes/no: No Difficulty breathing/shortness of breath  yes/no: No  Have you or a family member traveled in the last 14 days and where? yes/no: No   If the patient indicates "YES" to the above questions, their PAT will be rescheduled to limit the exposure to others and, the surgeon will be notified. THE PATIENT WILL NEED TO BE ASYMPTOMATIC FOR 14 DAYS.   If the patient is not experiencing any of these symptoms, the PAT nurse will instruct them to NOT bring anyone with them to their appointment since they may have these symptoms or traveled as well.   Please remind your patients and families that hospital visitation restrictions are in effect and the importance of the restrictions.     Patient verbalized understanding of instructions that were given to them at the PAT appointment. Patient was also instructed that they will need to review over the PAT instructions again at home before surgery.

## 2018-10-13 NOTE — Anesthesia Preprocedure Evaluation (Addendum)
Anesthesia Evaluation  Patient identified by MRN, date of birth, ID band Patient awake    Reviewed: Allergy & Precautions, NPO status , Patient's Chart, lab work & pertinent test results  Airway Mallampati: II  TM Distance: >3 FB     Dental  (+) Missing, Partial Lower, Dental Advisory Given,    Pulmonary pneumonia, former smoker,    breath sounds clear to auscultation       Cardiovascular hypertension, + Valvular Problems/Murmurs  Rhythm:Regular Rate:Normal     Neuro/Psych    GI/Hepatic negative GI ROS, Neg liver ROS,   Endo/Other  diabetes  Renal/GU      Musculoskeletal   Abdominal   Peds  Hematology  (+) anemia ,   Anesthesia Other Findings   Reproductive/Obstetrics                           Anesthesia Physical Anesthesia Plan  ASA: III  Anesthesia Plan: General   Post-op Pain Management:    Induction: Intravenous  PONV Risk Score and Plan: 3 and Dexamethasone, Ondansetron and Midazolam  Airway Management Planned: Oral ETT  Additional Equipment:   Intra-op Plan:   Post-operative Plan: Possible Post-op intubation/ventilation  Informed Consent:     Dental advisory given  Plan Discussed with: Anesthesiologist and CRNA  Anesthesia Plan Comments:        Anesthesia Quick Evaluation

## 2018-10-14 ENCOUNTER — Inpatient Hospital Stay (HOSPITAL_COMMUNITY)
Admission: RE | Admit: 2018-10-14 | Discharge: 2018-10-15 | DRG: 517 | Disposition: A | Discharge status: Home / Self Care | Payer: 59 | Attending: Student | Admitting: Student

## 2018-10-14 ENCOUNTER — Inpatient Hospital Stay (HOSPITAL_COMMUNITY): Payer: 59

## 2018-10-14 ENCOUNTER — Encounter (HOSPITAL_COMMUNITY): Payer: Self-pay

## 2018-10-14 ENCOUNTER — Other Ambulatory Visit: Payer: Self-pay

## 2018-10-14 ENCOUNTER — Inpatient Hospital Stay (HOSPITAL_COMMUNITY): Payer: 59 | Admitting: Physician Assistant

## 2018-10-14 ENCOUNTER — Encounter (HOSPITAL_COMMUNITY)
Admission: RE | Disposition: A | Discharge status: Home / Self Care | Payer: Self-pay | Source: Home / Self Care | Attending: Student

## 2018-10-14 ENCOUNTER — Inpatient Hospital Stay (HOSPITAL_COMMUNITY): Payer: 59 | Admitting: Anesthesiology

## 2018-10-14 DIAGNOSIS — Z833 Family history of diabetes mellitus: Secondary | ICD-10-CM

## 2018-10-14 DIAGNOSIS — T148XXA Other injury of unspecified body region, initial encounter: Secondary | ICD-10-CM

## 2018-10-14 DIAGNOSIS — Z7982 Long term (current) use of aspirin: Secondary | ICD-10-CM

## 2018-10-14 DIAGNOSIS — Z87891 Personal history of nicotine dependence: Secondary | ICD-10-CM | POA: Diagnosis not present

## 2018-10-14 DIAGNOSIS — E785 Hyperlipidemia, unspecified: Secondary | ICD-10-CM | POA: Diagnosis present

## 2018-10-14 DIAGNOSIS — Z794 Long term (current) use of insulin: Secondary | ICD-10-CM | POA: Diagnosis not present

## 2018-10-14 DIAGNOSIS — Z20828 Contact with and (suspected) exposure to other viral communicable diseases: Secondary | ICD-10-CM | POA: Diagnosis present

## 2018-10-14 DIAGNOSIS — S73004S Unspecified dislocation of right hip, sequela: Secondary | ICD-10-CM | POA: Diagnosis present

## 2018-10-14 DIAGNOSIS — E119 Type 2 diabetes mellitus without complications: Secondary | ICD-10-CM | POA: Diagnosis present

## 2018-10-14 DIAGNOSIS — Z79899 Other long term (current) drug therapy: Secondary | ICD-10-CM | POA: Diagnosis not present

## 2018-10-14 DIAGNOSIS — Z419 Encounter for procedure for purposes other than remedying health state, unspecified: Secondary | ICD-10-CM

## 2018-10-14 DIAGNOSIS — Z888 Allergy status to other drugs, medicaments and biological substances status: Secondary | ICD-10-CM | POA: Diagnosis not present

## 2018-10-14 DIAGNOSIS — J45909 Unspecified asthma, uncomplicated: Secondary | ICD-10-CM | POA: Diagnosis present

## 2018-10-14 DIAGNOSIS — Z8249 Family history of ischemic heart disease and other diseases of the circulatory system: Secondary | ICD-10-CM

## 2018-10-14 DIAGNOSIS — M898X9 Other specified disorders of bone, unspecified site: Secondary | ICD-10-CM | POA: Diagnosis present

## 2018-10-14 DIAGNOSIS — I10 Essential (primary) hypertension: Secondary | ICD-10-CM | POA: Diagnosis present

## 2018-10-14 HISTORY — PX: HIP CAPSULECTOMY: SHX6711

## 2018-10-14 LAB — GLUCOSE, CAPILLARY
Glucose-Capillary: 188 mg/dL — ABNORMAL HIGH (ref 70–99)
Glucose-Capillary: 160 mg/dL — ABNORMAL HIGH (ref 70–99)
Glucose-Capillary: 179 mg/dL — ABNORMAL HIGH (ref 70–99)
Glucose-Capillary: 185 mg/dL — ABNORMAL HIGH (ref 70–99)
Glucose-Capillary: 173 mg/dL — ABNORMAL HIGH (ref 70–99)

## 2018-10-14 SURGERY — CAPSULECTOMY, HIP
Anesthesia: General | Site: Hip | Laterality: Right

## 2018-10-14 MED ORDER — SUCCINYLCHOLINE CHLORIDE 200 MG/10ML IV SOSY
PREFILLED_SYRINGE | INTRAVENOUS | Status: DC | PRN
  Administered 2018-10-14: 140 mg via INTRAVENOUS

## 2018-10-14 MED ORDER — ROCURONIUM BROMIDE 10 MG/ML (PF) SYRINGE
PREFILLED_SYRINGE | INTRAVENOUS | Status: AC
  Filled 2018-10-14: qty 10

## 2018-10-14 MED ORDER — PHENYLEPHRINE 40 MCG/ML (10ML) SYRINGE FOR IV PUSH (FOR BLOOD PRESSURE SUPPORT)
PREFILLED_SYRINGE | INTRAVENOUS | Status: AC
  Filled 2018-10-14: qty 10

## 2018-10-14 MED ORDER — TRAZODONE HCL 50 MG PO TABS: 50.0000 mg | ORAL_TABLET | Freq: Every evening | ORAL | Status: DC | PRN

## 2018-10-14 MED ORDER — PROPOFOL 10 MG/ML IV BOLUS
INTRAVENOUS | Status: AC
  Filled 2018-10-14: qty 40

## 2018-10-14 MED ORDER — 0.9 % SODIUM CHLORIDE (POUR BTL) OPTIME
TOPICAL | Status: DC | PRN
  Administered 2018-10-14: 1000 mL

## 2018-10-14 MED ORDER — RAMIPRIL 10 MG PO CAPS
10.0000 mg | ORAL_CAPSULE | Freq: Every day | ORAL | Status: DC
  Administered 2018-10-14: 10 mg via ORAL
  Filled 2018-10-14 (×2): qty 1

## 2018-10-14 MED ORDER — ASPIRIN 325 MG PO TABS
325.0000 mg | ORAL_TABLET | Freq: Every day | ORAL | Status: DC
  Administered 2018-10-15: 325 mg via ORAL
  Filled 2018-10-14: qty 1

## 2018-10-14 MED ORDER — ONDANSETRON HCL 4 MG/2ML IJ SOLN: 4.0000 mg | Freq: Four times a day (QID) | INTRAMUSCULAR | Status: DC | PRN

## 2018-10-14 MED ORDER — FENTANYL CITRATE (PF) 100 MCG/2ML IJ SOLN
INTRAMUSCULAR | Status: AC
  Filled 2018-10-14: qty 2

## 2018-10-14 MED ORDER — ATORVASTATIN CALCIUM 10 MG PO TABS
10.0000 mg | ORAL_TABLET | Freq: Every day | ORAL | Status: DC
  Administered 2018-10-14: 10 mg via ORAL
  Filled 2018-10-14: qty 1

## 2018-10-14 MED ORDER — SUCCINYLCHOLINE CHLORIDE 200 MG/10ML IV SOSY
PREFILLED_SYRINGE | INTRAVENOUS | Status: AC
  Filled 2018-10-14: qty 10

## 2018-10-14 MED ORDER — METHOCARBAMOL 500 MG PO TABS
500.0000 mg | ORAL_TABLET | Freq: Four times a day (QID) | ORAL | Status: DC | PRN
  Administered 2018-10-14 (×2): 500 mg via ORAL
  Filled 2018-10-14 (×2): qty 1

## 2018-10-14 MED ORDER — MORPHINE SULFATE (PF) 2 MG/ML IV SOLN: 2.0000 mg | INTRAVENOUS | Status: DC | PRN

## 2018-10-14 MED ORDER — ACETAMINOPHEN 500 MG PO TABS
1000.0000 mg | ORAL_TABLET | Freq: Four times a day (QID) | ORAL | Status: DC
  Administered 2018-10-14 – 2018-10-15 (×5): 1000 mg via ORAL
  Filled 2018-10-14 (×4): qty 2

## 2018-10-14 MED ORDER — DOCUSATE SODIUM 100 MG PO CAPS
100.0000 mg | ORAL_CAPSULE | Freq: Two times a day (BID) | ORAL | Status: DC
  Administered 2018-10-14 – 2018-10-15 (×2): 100 mg via ORAL
  Filled 2018-10-14 (×2): qty 1

## 2018-10-14 MED ORDER — PHENYLEPHRINE 40 MCG/ML (10ML) SYRINGE FOR IV PUSH (FOR BLOOD PRESSURE SUPPORT)
PREFILLED_SYRINGE | INTRAVENOUS | Status: DC | PRN
  Administered 2018-10-14 (×2): 80 ug via INTRAVENOUS

## 2018-10-14 MED ORDER — METHOCARBAMOL 1000 MG/10ML IJ SOLN: 500.0000 mg | Freq: Four times a day (QID) | INTRAVENOUS | Status: DC | PRN

## 2018-10-14 MED ORDER — AMLODIPINE BESYLATE 5 MG PO TABS
5.0000 mg | ORAL_TABLET | Freq: Every day | ORAL | Status: DC
  Administered 2018-10-14 – 2018-10-15 (×2): 5 mg via ORAL
  Filled 2018-10-14 (×2): qty 1

## 2018-10-14 MED ORDER — FENTANYL CITRATE (PF) 250 MCG/5ML IJ SOLN
INTRAMUSCULAR | Status: DC | PRN
  Administered 2018-10-14 (×3): 50 ug via INTRAVENOUS

## 2018-10-14 MED ORDER — ONDANSETRON HCL 4 MG/2ML IJ SOLN
INTRAMUSCULAR | Status: DC | PRN
  Administered 2018-10-14: 4 mg via INTRAVENOUS

## 2018-10-14 MED ORDER — FENTANYL CITRATE (PF) 100 MCG/2ML IJ SOLN
25.0000 ug | INTRAMUSCULAR | Status: DC | PRN
  Administered 2018-10-14 (×3): 50 ug via INTRAVENOUS

## 2018-10-14 MED ORDER — TOPIRAMATE 25 MG PO TABS
50.0000 mg | ORAL_TABLET | Freq: Every day | ORAL | Status: DC
  Administered 2018-10-14: 50 mg via ORAL
  Filled 2018-10-14: qty 2

## 2018-10-14 MED ORDER — PANTOPRAZOLE SODIUM 40 MG PO TBEC
40.0000 mg | DELAYED_RELEASE_TABLET | Freq: Every day | ORAL | Status: DC
  Administered 2018-10-14 – 2018-10-15 (×2): 40 mg via ORAL
  Filled 2018-10-14 (×2): qty 1

## 2018-10-14 MED ORDER — ROCURONIUM BROMIDE 10 MG/ML (PF) SYRINGE
PREFILLED_SYRINGE | INTRAVENOUS | Status: DC | PRN
  Administered 2018-10-14: 60 mg via INTRAVENOUS

## 2018-10-14 MED ORDER — PROPOFOL 10 MG/ML IV BOLUS
INTRAVENOUS | Status: DC | PRN
  Administered 2018-10-14: 160 mg via INTRAVENOUS
  Administered 2018-10-14: 40 mg via INTRAVENOUS

## 2018-10-14 MED ORDER — CHLORHEXIDINE GLUCONATE 4 % EX LIQD: 60.0000 mL | Freq: Once | CUTANEOUS | Status: DC

## 2018-10-14 MED ORDER — INSULIN ASPART 100 UNIT/ML ~~LOC~~ SOLN
0.0000 [IU] | Freq: Three times a day (TID) | SUBCUTANEOUS | Status: DC
  Administered 2018-10-14 (×2): 3 [IU] via SUBCUTANEOUS
  Administered 2018-10-15: 2 [IU] via SUBCUTANEOUS

## 2018-10-14 MED ORDER — CEFAZOLIN SODIUM-DEXTROSE 2-4 GM/100ML-% IV SOLN
2.0000 g | Freq: Three times a day (TID) | INTRAVENOUS | Status: AC
  Administered 2018-10-14 – 2018-10-15 (×3): 2 g via INTRAVENOUS
  Filled 2018-10-14 (×3): qty 100

## 2018-10-14 MED ORDER — SUGAMMADEX SODIUM 200 MG/2ML IV SOLN
INTRAVENOUS | Status: DC | PRN
  Administered 2018-10-14: 175 mg via INTRAVENOUS

## 2018-10-14 MED ORDER — MIDAZOLAM HCL 5 MG/5ML IJ SOLN
INTRAMUSCULAR | Status: DC | PRN
  Administered 2018-10-14: 2 mg via INTRAVENOUS

## 2018-10-14 MED ORDER — BACITRACIN ZINC 500 UNIT/GM EX OINT
TOPICAL_OINTMENT | CUTANEOUS | Status: AC
  Filled 2018-10-14: qty 28.35

## 2018-10-14 MED ORDER — MIDAZOLAM HCL 2 MG/2ML IJ SOLN
INTRAMUSCULAR | Status: AC
  Filled 2018-10-14: qty 2

## 2018-10-14 MED ORDER — LIDOCAINE 2% (20 MG/ML) 5 ML SYRINGE
INTRAMUSCULAR | Status: DC | PRN
  Administered 2018-10-14: 100 mg via INTRAVENOUS

## 2018-10-14 MED ORDER — CEFAZOLIN SODIUM-DEXTROSE 2-4 GM/100ML-% IV SOLN
2.0000 g | INTRAVENOUS | Status: AC
  Administered 2018-10-14: 2 g via INTRAVENOUS

## 2018-10-14 MED ORDER — VANCOMYCIN HCL 1000 MG IV SOLR
INTRAVENOUS | Status: DC | PRN
  Administered 2018-10-14: 1000 mg via TOPICAL

## 2018-10-14 MED ORDER — LACTATED RINGERS IV SOLN
INTRAVENOUS | Status: DC | PRN
  Administered 2018-10-14: 07:00:00 via INTRAVENOUS

## 2018-10-14 MED ORDER — FENTANYL CITRATE (PF) 250 MCG/5ML IJ SOLN
INTRAMUSCULAR | Status: AC
  Filled 2018-10-14: qty 5

## 2018-10-14 MED ORDER — METOCLOPRAMIDE HCL 5 MG/ML IJ SOLN: 5.0000 mg | Freq: Three times a day (TID) | INTRAMUSCULAR | Status: DC | PRN

## 2018-10-14 MED ORDER — ALBUTEROL SULFATE (2.5 MG/3ML) 0.083% IN NEBU: 2.5000 mg | INHALATION_SOLUTION | RESPIRATORY_TRACT | Status: DC | PRN

## 2018-10-14 MED ORDER — METOCLOPRAMIDE HCL 5 MG PO TABS: 5.0000 mg | ORAL_TABLET | Freq: Three times a day (TID) | ORAL | Status: DC | PRN

## 2018-10-14 MED ORDER — POTASSIUM CHLORIDE IN NACL 20-0.9 MEQ/L-% IV SOLN
INTRAVENOUS | Status: DC
  Administered 2018-10-14: 13:00:00 via INTRAVENOUS
  Filled 2018-10-14: qty 1000

## 2018-10-14 MED ORDER — DEXAMETHASONE SODIUM PHOSPHATE 10 MG/ML IJ SOLN
INTRAMUSCULAR | Status: DC | PRN
  Administered 2018-10-14: 4 mg via INTRAVENOUS

## 2018-10-14 MED ORDER — LIDOCAINE 2% (20 MG/ML) 5 ML SYRINGE
INTRAMUSCULAR | Status: AC
  Filled 2018-10-14: qty 5

## 2018-10-14 MED ORDER — TRAMADOL HCL 50 MG PO TABS
50.0000 mg | ORAL_TABLET | Freq: Four times a day (QID) | ORAL | Status: DC | PRN
  Administered 2018-10-14 – 2018-10-15 (×4): 50 mg via ORAL
  Filled 2018-10-14 (×3): qty 1

## 2018-10-14 MED ORDER — TRAMADOL HCL 50 MG PO TABS
ORAL_TABLET | ORAL | Status: AC
  Filled 2018-10-14: qty 1

## 2018-10-14 MED ORDER — ONDANSETRON HCL 4 MG PO TABS: 4.0000 mg | ORAL_TABLET | Freq: Four times a day (QID) | ORAL | Status: DC | PRN

## 2018-10-14 MED ORDER — CEFAZOLIN SODIUM-DEXTROSE 2-4 GM/100ML-% IV SOLN
INTRAVENOUS | Status: AC
  Filled 2018-10-14: qty 100

## 2018-10-14 MED ORDER — TOBRAMYCIN SULFATE 1.2 G IJ SOLR
INTRAMUSCULAR | Status: AC
  Filled 2018-10-14: qty 1.2

## 2018-10-14 MED ORDER — VANCOMYCIN HCL 1000 MG IV SOLR
INTRAVENOUS | Status: AC
  Filled 2018-10-14: qty 1000

## 2018-10-14 SURGICAL SUPPLY — 37 items
ADH SKN CLS APL DERMABOND .7 (GAUZE/BANDAGES/DRESSINGS) ×1
APL PRP STRL LF DISP 70% ISPRP (MISCELLANEOUS) ×2
BRUSH SCRUB EZ PLAIN DRY (MISCELLANEOUS) ×4 IMPLANT
CHLORAPREP W/TINT 26 (MISCELLANEOUS) ×4 IMPLANT
COVER SURGICAL LIGHT HANDLE (MISCELLANEOUS) ×3 IMPLANT
COVER WAND RF STERILE (DRAPES) ×1 IMPLANT
DERMABOND ADVANCED (GAUZE/BANDAGES/DRESSINGS) ×1
DERMABOND ADVANCED .7 DNX12 (GAUZE/BANDAGES/DRESSINGS) IMPLANT
DRAPE C-ARM 42X72 X-RAY (DRAPES) ×2 IMPLANT
DRAPE C-ARMOR (DRAPES) IMPLANT
DRAPE U-SHAPE 47X51 STRL (DRAPES) ×2 IMPLANT
DRSG MEPILEX BORDER 4X8 (GAUZE/BANDAGES/DRESSINGS) ×1 IMPLANT
ELECT REM PT RETURN 9FT ADLT (ELECTROSURGICAL) ×2
ELECTRODE REM PT RTRN 9FT ADLT (ELECTROSURGICAL) ×1 IMPLANT
GLOVE BIO SURGEON STRL SZ 6.5 (GLOVE) ×6 IMPLANT
GLOVE BIO SURGEON STRL SZ7.5 (GLOVE) ×6 IMPLANT
GLOVE BIOGEL PI IND STRL 6.5 (GLOVE) ×1 IMPLANT
GLOVE BIOGEL PI IND STRL 7.5 (GLOVE) ×1 IMPLANT
GLOVE BIOGEL PI INDICATOR 6.5 (GLOVE) ×2
GLOVE BIOGEL PI INDICATOR 7.5 (GLOVE) ×1
GOWN STRL REUS W/ TWL LRG LVL3 (GOWN DISPOSABLE) ×2 IMPLANT
GOWN STRL REUS W/TWL LRG LVL3 (GOWN DISPOSABLE) ×4
KIT BASIN OR (CUSTOM PROCEDURE TRAY) ×2 IMPLANT
KIT TURNOVER KIT B (KITS) ×2 IMPLANT
MANIFOLD NEPTUNE II (INSTRUMENTS) ×2 IMPLANT
NS IRRIG 1000ML POUR BTL (IV SOLUTION) ×2 IMPLANT
PACK TOTAL JOINT (CUSTOM PROCEDURE TRAY) ×2 IMPLANT
PAD ARMBOARD 7.5X6 YLW CONV (MISCELLANEOUS) ×4 IMPLANT
STAPLER VISISTAT 35W (STAPLE) ×2 IMPLANT
SUT MNCRL AB 3-0 PS2 18 (SUTURE) ×1 IMPLANT
SUT VIC AB 0 CT1 27 (SUTURE) ×2
SUT VIC AB 0 CT1 27XBRD ANBCTR (SUTURE) IMPLANT
SUT VIC AB 2-0 CT1 27 (SUTURE) ×4
SUT VIC AB 2-0 CT1 TAPERPNT 27 (SUTURE) IMPLANT
TOWEL GREEN STERILE (TOWEL DISPOSABLE) ×4 IMPLANT
TOWEL GREEN STERILE FF (TOWEL DISPOSABLE) ×2 IMPLANT
WATER STERILE IRR 1000ML POUR (IV SOLUTION) ×6 IMPLANT

## 2018-10-14 NOTE — Anesthesia Procedure Notes (Signed)
Procedure Name: Intubation Date/Time: 10/14/2018 7:42 AM Performed by: Colin Benton, CRNA Pre-anesthesia Checklist: Patient identified, Emergency Drugs available, Suction available and Patient being monitored Patient Re-evaluated:Patient Re-evaluated prior to induction Oxygen Delivery Method: Circle system utilized Preoxygenation: Pre-oxygenation with 100% oxygen Induction Type: IV induction and Rapid sequence Laryngoscope Size: Miller and 2 Grade View: Grade I Tube type: Oral Tube size: 7.0 mm Number of attempts: 1 Airway Equipment and Method: Stylet Placement Confirmation: ETT inserted through vocal cords under direct vision,  positive ETCO2 and breath sounds checked- equal and bilateral Secured at: 22 cm Tube secured with: Tape Dental Injury: Teeth and Oropharynx as per pre-operative assessment

## 2018-10-14 NOTE — Evaluation (Signed)
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 PM includes asthma, DM, HTN.   Clinical Impression  Pt s/p surgery above with deficits below. Pt tolerated gait training well this session, requiring min guard A using RW. Pt reports family can assist as needed at d/c. Will need DME below. Will continue to follow acutely to maximize functional mobility independence and safety.     Follow Up Recommendations Outpatient PT;Supervision for mobility/OOB(outpatient PT once cleared by MD)    Equipment Recommendations  Rolling walker with 5" wheels;3in1 (PT)    Recommendations for Other Services       Precautions / Restrictions Precautions Precautions: Fall Restrictions Weight Bearing Restrictions: Yes RLE Weight Bearing: Weight bearing as tolerated      Mobility  Bed Mobility Overal bed mobility: Needs Assistance Bed Mobility: Supine to Sit;Sit to Supine     Supine to sit: Min assist Sit to supine: Min assist   General bed mobility comments: Min A for RLE assist. Increased time required to perform bed mobility tasks.   Transfers Overall transfer level: Needs assistance Equipment used: Rolling walker (2 wheeled) Transfers: Sit to/from Stand Sit to Stand: Min guard         General transfer comment: Min guard for safety. Cues for safe hand placement.   Ambulation/Gait Ambulation/Gait assistance: Min guard Gait Distance (Feet): 125 Feet Assistive device: Rolling walker (2 wheeled) Gait Pattern/deviations: Step-through pattern;Decreased step length - right;Decreased step length - left;Decreased weight shift to right;Antalgic Gait velocity: Decreased   General Gait Details: Slow, antalgic gait. Cues for sequencing using RW. Pt with decreased weightshift to RLE secondary to pain in R hip.    Stairs            Wheelchair Mobility    Modified Rankin (Stroke Patients Only)       Balance Overall balance assessment: Needs assistance Sitting-balance support: No upper extremity supported;Feet supported Sitting balance-Leahy Scale: Good     Standing balance support: Bilateral upper extremity supported;During functional activity Standing balance-Leahy Scale: Poor Standing balance comment: Reliant on BUE support                              Pertinent Vitals/Pain Pain Assessment: 0-10 Pain Score: 7  Pain Location: R hip  Pain Descriptors / Indicators: Aching;Operative site guarding Pain Intervention(s): Limited activity within patient's tolerance;Monitored during session;Repositioned    Home Living Family/patient expects to be discharged to:: Private residence Living Arrangements: Children;Spouse/significant other Available Help at Discharge: Family;Available 24 hours/day Type of Home: House Home Access: Stairs to enter;Ramped entrance Entrance Stairs-Rails: Right;Left Entrance Stairs-Number of Steps: 5-6 Home Layout: One level Home Equipment: None      Prior Function Level of Independence: Independent               Hand Dominance        Extremity/Trunk Assessment   Upper Extremity Assessment Upper Extremity Assessment: Overall WFL for tasks assessed    Lower Extremity Assessment Lower Extremity Assessment: RLE deficits/detail RLE Deficits / Details: Deficits consistent with post op pain and weakness.     Cervical / Trunk Assessment Cervical / Trunk Assessment: Normal  Communication   Communication: No difficulties  Cognition Arousal/Alertness: Awake/alert Behavior During Therapy: WFL for tasks assessed/performed Overall Cognitive Status: Within Functional Limits for tasks  assessed                                        General Comments      Exercises     Assessment/Plan    PT Assessment Patient  needs continued PT services  PT Problem List Decreased strength;Decreased balance;Decreased mobility;Decreased activity tolerance;Decreased knowledge of use of DME;Decreased knowledge of precautions;Pain       PT Treatment Interventions DME instruction;Gait training;Functional mobility training;Therapeutic activities;Therapeutic exercise;Stair training;Balance training;Patient/family education    PT Goals (Current goals can be found in the Care Plan section)  Acute Rehab PT Goals Patient Stated Goal: to go home PT Goal Formulation: With patient Time For Goal Achievement: 10/28/18 Potential to Achieve Goals: Good    Frequency Min 5X/week   Barriers to discharge        Co-evaluation               AM-PAC PT "6 Clicks" Mobility  Outcome Measure Help needed turning from your back to your side while in a flat bed without using bedrails?: A Little Help needed moving from lying on your back to sitting on the side of a flat bed without using bedrails?: A Little Help needed moving to and from a bed to a chair (including a wheelchair)?: A Little Help needed standing up from a chair using your arms (e.g., wheelchair or bedside chair)?: A Little Help needed to walk in hospital room?: A Little Help needed climbing 3-5 steps with a railing? : A Lot 6 Click Score: 17    End of Session Equipment Utilized During Treatment: Gait belt Activity Tolerance: Patient tolerated treatment well Patient left: in bed;with call bell/phone within reach;with family/visitor present Nurse Communication: Mobility status PT Visit Diagnosis: Unsteadiness on feet (R26.81);Muscle weakness (generalized) (M62.81);Pain Pain - Right/Left: Right Pain - part of body: Hip    Time: [id number] PT Time Calculation (min) (ACUTE ONLY): 21 min   Charges:   PT Evaluation $PT Eval Low Complexity: [place], PT, DPT  Acute Rehabilitation Services  Pager: [phone] Office: [phone]   Rudean Hitt 10/14/2018, 6:17 PM

## 2018-10-14 NOTE — Progress Notes (Addendum)
1055 Received pt from PACU, A&O x4. Right hip incision with Aquacel dressing dry and intact. Ice pack in place.  Pt is up and walking with walker to the bathroom. Pain is controlled. Radiation treatment tom at 0830, at [facility] ([facility]). I called [facility] and they will pick up pt at 0745.

## 2018-10-14 NOTE — Consult Note (Signed)
Radiation Oncology         [phone]) [phone] ________________________________  Name: Veronica Fletcher MRN: [id number]  Date of Service: 10/14/2018   DOB: 06-03-1965  [id number], Dwyane Luo, MD  No ref. provider found     REFERRING PHYSICIAN: Dr. Doreatha Martin  DIAGNOSIS: Right heterotopic ossification  HISTORY OF PRESENT ILLNESS:Veronica Fletcher is a 53 y.o. female who is seen for an initial consultation visit regarding the patient's diagnosis of heterotopic ossification that developed in the right hip. It is felt this arose due to prior injury and she was counseled on excision of the ossification. She was taken to the OR today for excision and is now back in her hospital room for recovery.  She  patient is felt to be at significant risk for the development of heterotopic ossification. We've therefore been asked to see the patient today for consideration of postoperative radiation treatment for the prevention of heterotopic ossification postoperatively.   PREVIOUS RADIATION THERAPY: No   PAST MEDICAL HISTORY:  has a past medical history of Asthma, Diabetes mellitus, Heart murmur, Hyperlipidemia, Hypertension, Pancreatitis, and Pneumonia.     PAST SURGICAL HISTORY: Past Surgical History:  Procedure Laterality Date  . ABDOMINAL HYSTERECTOMY    . CESAREAN SECTION    . CHOLECYSTECTOMY    . GANGLION CYST EXCISION    . LAPAROSCOPY       FAMILY HISTORY: family history includes Diabetes in her maternal aunt, maternal grandmother, mother, and paternal aunt; Hypertension in her mother.   SOCIAL HISTORY:  reports that she quit smoking about 5 years ago. Her smoking use included cigarettes. She smoked 0.50 packs per day. She has never used smokeless tobacco. She reports current alcohol use. She reports that she does not use drugs.  The patient is divorced and works for [employer]. She lives in [place].  ALLERGIES: Liraglutide   MEDICATIONS:  Current Facility-Administered Medications  Medication Dose  Route Frequency Provider Last Rate Last Dose  . 0.9 % NaCl with KCl 20 mEq/ L  infusion   Intravenous Continuous Delray Alt, PA-C 75 mL/hr at 10/14/18 1247    . acetaminophen (TYLENOL) tablet 1,000 mg  1,000 mg Oral Q6H Delray Alt, PA-C   1,000 mg at 10/14/18 1239  . albuterol (PROVENTIL) (2.5 MG/3ML) 0.083% nebulizer solution 2.5 mg  2.5 mg Inhalation Q4H PRN Delray Alt, PA-C      . amLODipine (NORVASC) tablet 5 mg  5 mg Oral Daily Patrecia Pace A, PA-C   5 mg at 10/14/18 1238  . [START ON 10/15/2018] aspirin tablet 325 mg  325 mg Oral Daily Patrecia Pace A, PA-C      . atorvastatin (LIPITOR) tablet 10 mg  10 mg Oral q1800 Patrecia Pace A, PA-C      . ceFAZolin (ANCEF) IVPB 2g/100 mL premix  2 g Intravenous Q8H Patrecia Pace A, PA-C 200 mL/hr at 10/14/18 1441 2 g at 10/14/18 1441  . docusate sodium (COLACE) capsule 100 mg  100 mg Oral BID Patrecia Pace A, PA-C      . fentaNYL (SUBLIMAZE) 100 MCG/2ML injection           . fentaNYL (SUBLIMAZE) 100 MCG/2ML injection           . insulin aspart (novoLOG) injection 0-15 Units  0-15 Units Subcutaneous TID WC Delray Alt, PA-C   3 Units at 10/14/18 1240  . methocarbamol (ROBAXIN) tablet 500 mg  500 mg Oral Q6H PRN Delray Alt, PA-C   500 mg at 10/14/18  1241   Or  . methocarbamol (ROBAXIN) 500 mg in dextrose 5 % 50 mL IVPB  500 mg Intravenous Q6H PRN Delray Alt, PA-C      . metoCLOPramide (REGLAN) tablet 5-10 mg  5-10 mg Oral Q8H PRN Patrecia Pace A, PA-C       Or  . metoCLOPramide (REGLAN) injection 5-10 mg  5-10 mg Intravenous Q8H PRN Patrecia Pace A, PA-C      . morphine 2 MG/ML injection 2 mg  2 mg Intravenous Q2H PRN Delray Alt, PA-C      . ondansetron (ZOFRAN) tablet 4 mg  4 mg Oral Q6H PRN Delray Alt, PA-C       Or  . ondansetron (ZOFRAN) injection 4 mg  4 mg Intravenous Q6H PRN Delray Alt, PA-C      . pantoprazole (PROTONIX) EC tablet 40 mg  40 mg Oral Daily Patrecia Pace A, PA-C   40 mg at 10/14/18  1239  . ramipril (ALTACE) capsule 10 mg  10 mg Oral Daily Patrecia Pace A, PA-C   10 mg at 10/14/18 1257  . topiramate (TOPAMAX) tablet 50 mg  50 mg Oral QHS Patrecia Pace A, PA-C      . traMADol (ULTRAM) 50 MG tablet           . traMADol (ULTRAM) tablet 50 mg  50 mg Oral Q6H PRN Patrecia Pace A, PA-C   50 mg at 10/14/18 1459  . traZODone (DESYREL) tablet 50 mg  50 mg Oral QHS PRN Delray Alt, PA-C         REVIEW OF SYSTEMS:  On review of systems, the patient reports that she is doing well overall but is having some pain in her right hip. She denies any chest pain, shortness of breath, cough, fevers, chills, night sweats, unintended weight changes. She denies any bowel or bladder disturbances, and denies abdominal pain, nausea or vomiting. She denies any new musculoskeletal or joint aches or pains. A complete review of systems is obtained and is otherwise negative.     PHYSICAL EXAM:  height is 5\' 2"  (1.575 m) and weight is 192 lb (87.1 kg). Her oral temperature is 99.8 F (37.7 C). Her blood pressure is 122/70 and her pulse is 99. Her respiration is 16 and oxygen saturation is 98%.   Unable to assess due to encounter type.    LABORATORY DATA:  Lab Results  Component Value Date   WBC 8.6 10/12/2018   HGB 13.0 10/12/2018   HCT 42.1 10/12/2018   MCV 94.4 10/12/2018   PLT 332 10/12/2018   Lab Results  Component Value Date   NA 138 10/12/2018   K 4.3 10/12/2018   CL 104 10/12/2018   CO2 24 10/12/2018   Lab Results  Component Value Date   ALT 10 06/28/2018   AST 15 06/28/2018   ALKPHOS 74 06/28/2018   BILITOT 0.7 06/28/2018      RADIOGRAPHY: Dg C-arm 1-60 Min  Result Date: 10/14/2018 CLINICAL DATA:  RIGHT HIP HETEROTOPIC OSSIFICATION RESECTION. Dr. Pearson Grippe time 23 seconds RSTO performed by Glade Stanford, RT(R) EXAM: DG C-ARM 1-60 MIN; OPERATIVE RIGHT HIP WITH PELVIS FLUOROSCOPY TIME:  Fluoroscopy Time:  23 seconds COMPARISON:  CT of the pelvis on 08/20/2018 FINDINGS:  Images are performed prior to and following the removal of heterotopic bone along the superior aspect of the RIGHT acetabulum. Hip intact and normally located. IMPRESSION: Status post removal of heterotopic bone. Electronically Signed  By: Nolon Nations M.D.   On: 10/14/2018 09:19   Dg Hip Operative Unilat With Pelvis Right  Result Date: 10/14/2018 CLINICAL DATA:  RIGHT HIP HETEROTOPIC OSSIFICATION RESECTION. Dr. Pearson Grippe time 23 seconds RSTO performed by Glade Stanford, RT(R) EXAM: DG C-ARM 1-60 MIN; OPERATIVE RIGHT HIP WITH PELVIS FLUOROSCOPY TIME:  Fluoroscopy Time:  23 seconds COMPARISON:  CT of the pelvis on 08/20/2018 FINDINGS: Images are performed prior to and following the removal of heterotopic bone along the superior aspect of the RIGHT acetabulum. Hip intact and normally located. IMPRESSION: Status post removal of heterotopic bone. Electronically Signed   By: Nolon Nations M.D.   On: 10/14/2018 09:19   Dg Hip Unilat W Or W/o Pelvis 2-3 Views Right  Result Date: 10/14/2018 CLINICAL DATA:  Status post right hip heterotopic ossification resection. EXAM: DG HIP (WITH OR WITHOUT PELVIS) 2-3V RIGHT COMPARISON:  Fluoroscopic images of same day. CT scan of August 20, 2018. FINDINGS: There is been interval surgical resection of large heterotopic ossification seen arising from the superior portion of the hip region. No fracture or dislocation is noted. IMPRESSION: Interval surgical resection of large heterotopic ossification seen arising from superior portion of the right hip region. Electronically Signed   By: Marijo Conception M.D.   On: 10/14/2018 10:25       IMPRESSION:  The patient has been diagnosed with Heterotopic Ossification and has undergone excision of this surgically. The patient is a good candidate for one fraction of postoperative radiation treatment for the prevention of the additional development of heterotopic ossification.  I have discussed the rationale of this treatment  with the patient. I have discussed the possible/expected benefit of such a treatment. I have also discussed the possible side effects and risks of treatment as well. All of the patient's questions have been answered.   PLAN: The patient will undergo simulation and one fraction of external beam radiation treatment. This will be completed to a dose of 7 Gy. This treatment will be completed on postoperative day #1.       ________________________________     Carola Rhine, PAC On behalf of Jodelle Gross, MD PhD   **Disclaimer: This note was dictated with voice recognition software. Similar sounding words can inadvertently be transcribed and this note may contain transcription errors which may not have been corrected upon publication of note.**

## 2018-10-14 NOTE — Op Note (Signed)
Orthopaedic Surgery Operative Note (CSN: [id number] ) Date of Surgery: 10/14/2018  Admit Date: 10/14/2018   Diagnoses: Pre-Op Diagnoses: Right hip heterotopic ossification   Post-Op Diagnosis: Same  Procedures: CPT 27036-Removal of heterotopic ossification of right hip  Surgeons : Primary: Velinda Wrobel, Thomasene Lot, MD  Assistant: Patrecia Pace, PA-C  Location: OR 6   Anesthesia:General  Antibiotics: Ancef 2g preop   Tourniquet time:None  Estimated Blood [id number] mL  Complications:None   Specimens:None  Implants: * No implants in log *   Indications for Surgery: 53 year old female who has a history of right hip heterotopic ossification.  It looks like it developed from an avulsion injury from her rectus femoris from the AIIS.  It has been causing significant pain and discomfort for a significant period of time that has worsened over the last few months.  Due to the continued pain and failed conservative management I recommended proceeding with excision of heterotopic ossification.  Risks and benefits were discussed with the patient.  Risks included but not limited to bleeding, infection, recurrence of the heterotopic ossification, nerve and blood vessel injury, continued pain, limited range of motion, DVT, even the possibility anesthetic complications.  She agreed to proceed with surgery and consent was obtained.  Operative Findings: Successful excision of heterotopic ossification without any complications.  Procedure: The patient was identified in the preoperative holding area. Consent was confirmed with the patient and their family and all questions were answered. The operative extremity was marked after confirmation with the patient. she was then brought back to the operating room by our anesthesia colleagues.  She was carefully transferred over to a radiolucent flat top table.  She was placed under general anesthetic.  The operative extremity was then prepped and draped in usual  sterile fashion. A preoperative timeout was performed to verify the patient, the procedure, and the extremity. Preoperative antibiotics were dosed.  Fluoroscopic imaging was obtained to identify the heterotopic ossification.  There is a large bone mass that was emanating from the AIIS.  With an obturator oblique view was able to visualize the entirety of the lesion.  I then made a anterior approach to the hip.  It was a very similar interval to the Smith-Petersen approach.  I developed the plane between the sartorius and the tensor fascia lata.  I developed some of the soft tissues more proximally than the standard interval.  Here I encountered the mass of bone that was within the rectus femoris tendon.  I incised open the tendon which I encountered the bone.  I perform subperiosteal dissection medial and lateral to the mass of extra bone.  I followed this all the way up until I reached the AIIS.  I then used a osteotome to develop the plane between the pelvis and the heterotopic bone.  I was able to excise the bone in bulk.  There was some residual small fragments in the tendon itself which were excised using Bovie electrocautery.  A ronguer was used to remove some of the remaining bone on the pelvis.  Bone wax was placed on the cancellus bone of the pelvis.  Fluoroscopic imaging was obtained to show that the resection was successful.  I thoroughly irrigated the incision.  A gram of vancomycin powder was placed into the wound.  A layered closure consisting of 0 Vicryl, 2-0 Vicryl and 3-0 Monocryl was placed.  Dermabond was used to seal the skin.  Sterile dressing was placed.  The patient was awoken from anesthesia and taken to the PACU  in stable condition.  Post Op Plan/Instructions: The patient will be weightbearing as tolerated to the right lower extremity.  She will receive postoperative Ancef.  She will receive aspirin for DVT prophylaxis.  We will admit her for pain control as well as coordination for  radiation therapy to her right hip.  She will be discharged postoperative day 1 versus 2.  I was present and performed the entire surgery.  Patrecia Pace, PA-C did assist me throughout the case. An assistant was necessary given the difficulty in approach, maintenance of reduction and ability to instrument the fracture.   Katha Hamming, MD [facility]

## 2018-10-14 NOTE — Transfer of Care (Signed)
Immediate Anesthesia Transfer of Care Note  Patient: Veronica Fletcher  Procedure(s) Performed: RIGHT HIP HETEROTOPIC OSSIFICATION RESECTION (Right Hip)  Patient Location: PACU  Anesthesia Type:General  Level of Consciousness: awake, alert , oriented and patient cooperative  Airway & Oxygen Therapy: Patient Spontanous Breathing and Patient connected to nasal cannula oxygen  Post-op Assessment: Report given to RN and Post -op Vital signs reviewed and stable  Post vital signs: Reviewed and stable  Last Vitals:  Vitals Value Taken Time  BP 130/71 10/14/18 0916  Temp    Pulse 91 10/14/18 0917  Resp 19 10/14/18 0917  SpO2 90 % 10/14/18 0917  Vitals shown include unvalidated device data.  Last Pain:  Vitals:   10/14/18 0628  TempSrc: Oral  PainSc:          Complications: No apparent anesthesia complications

## 2018-10-14 NOTE — Care Management (Signed)
CM consult acknowledged to assist with any HH/DME needs. Awaiting PT/OT eval for DCP recommendations and will continue to follow.  Adaijah Endres RN, BSN, NCM-BC, ACM-RN [phone] 

## 2018-10-14 NOTE — Anesthesia Postprocedure Evaluation (Signed)
Anesthesia Post Note  Patient: Veronica Fletcher  Procedure(s) Performed: RIGHT HIP HETEROTOPIC OSSIFICATION RESECTION (Right Hip)     Patient location during evaluation: PACU Anesthesia Type: General Level of consciousness: awake Pain management: pain level controlled Vital Signs Assessment: post-procedure vital signs reviewed and stable Respiratory status: spontaneous breathing Cardiovascular status: stable Postop Assessment: no apparent nausea or vomiting Anesthetic complications: no    Last Vitals:  Vitals:   10/14/18 1030 10/14/18 1058  BP: 135/69 140/71  Pulse: 98 96  Resp: 18 17  Temp:    SpO2: 92% 98%    Last Pain:  Vitals:   10/14/18 1030  TempSrc:   PainSc: 4                  Jaquez Farrington

## 2018-10-14 NOTE — Progress Notes (Signed)
Orthopedic Tech Progress Note Patient Details:  Veronica Fletcher [birth year] [id number] Applied Over head Frame with Trapeze for patient Patient ID: [name], female   DOB: [birth year], 53 y.o.   MRN: [id number]   Janit Pagan 10/14/2018, 12:02 PM

## 2018-10-14 NOTE — H&P (Signed)
Orthopaedic Trauma Service (OTS) Consult   Patient ID: Veronica Fletcher MRN: [id number] DOB/AGE: 1965/10/11 53 y.o.  Reason for Surgery: Right hip heterotopic ossification  HPI: Veronica Fletcher is an 53 y.o. female who presents for surgery for removal of heterotopic ossification from her right hip.  She has developed chronic pain over her hip area over the last few years that has worsened the last couple months.  She states that nothing helps her.  Medication does not help her.  She presents for excision of the heterotopic ossification.  She describes the pain in her groin.  It worsens with motion.  Limits her ability to ambulate.  Past Medical History:  Diagnosis Date  . Asthma   . Diabetes mellitus   . Heart murmur   . Hyperlipidemia   . Hypertension   . Pancreatitis   . Pneumonia     Past Surgical History:  Procedure Laterality Date  . ABDOMINAL HYSTERECTOMY    . CESAREAN SECTION    . CHOLECYSTECTOMY    . GANGLION CYST EXCISION    . LAPAROSCOPY      Family History  Problem Relation Age of Onset  . Diabetes Mother   . Hypertension Mother   . Diabetes Maternal Aunt   . Diabetes Paternal Aunt   . Diabetes Maternal Grandmother   . Heart disease Neg Hx     Social History:  reports that she quit smoking about 5 years ago. Her smoking use included cigarettes. She smoked 0.50 packs per day. She has never used smokeless tobacco. She reports current alcohol use. She reports that she does not use drugs.  Allergies:  Allergies  Allergen Reactions  . Liraglutide Other (See Comments)    Other reaction(s): Pancreatitis  *Victoza     Medications:  No current facility-administered medications on file prior to encounter.    Current Outpatient Medications on File Prior to Encounter  Medication Sig Dispense Refill  . amLODipine (NORVASC) 5 MG tablet Take 5 mg by mouth daily.    Marland Kitchen aspirin EC 81 MG tablet Take 81 mg by mouth every morning.    Marland Kitchen atorvastatin (LIPITOR) 10 MG tablet  TAKE 1 TABLET (10 MG TOTAL) BY MOUTH DAILY AT 6 PM. 90 tablet 1  . diphenhydramine-acetaminophen (TYLENOL PM) 25-500 MG TABS tablet Take 2 tablets by mouth at bedtime.    Marland Kitchen FARXIGA 5 MG TABS tablet TAKE 1 TABLET BY MOUTH EVERY DAY 90 tablet 2  . fluticasone (FLONASE) 50 MCG/ACT nasal spray Place 1 spray into both nostrils daily as needed for allergies or rhinitis.    . metFORMIN (GLUCOPHAGE-XR) 500 MG 24 hr tablet TAKE 4 TABLETS (2,000 MG TOTAL) BY MOUTH DAILY WITH SUPPER. (Patient taking differently: Take 1,000 mg by mouth See admin instructions. Take 2 tablets by mouth in the morning and 2 tablets at night.) 360 tablet 0  . Multiple Vitamins-Minerals (MULTIVITAMIN WITH MINERALS) tablet Take 1 tablet by mouth every morning. Patient takes Flinstones complete with Vitamin D    . NOVOLOG MIX 70/30 FLEXPEN (70-30) 100 UNIT/ML FlexPen Inject 16-18 Units into the skin See admin instructions. 18 units in the morning and 16 units at night    . ramipril (ALTACE) 10 MG capsule Take 10 mg by mouth daily.    . Semaglutide, 1 MG/DOSE, (OZEMPIC, 1 MG/DOSE,) 2 MG/1.5ML SOPN Inject 1 mg into the skin once a week. 9 pen 1  . topiramate (TOPAMAX) 50 MG tablet Take 50 mg by mouth at bedtime.    . traMADol (  ULTRAM) 50 MG tablet Take 50 mg by mouth every 12 (twelve) hours as needed (pain).     . Accu-Chek FastClix Lancets MISC 1 each by Does not apply route 2 (two) times a day. Use Accu Chek Fastclix lancets to check blood sugar twice daily. 100 each 3  . albuterol (VENTOLIN HFA) 108 (90 Base) MCG/ACT inhaler Inhale 1-2 puffs into the lungs every 4 (four) hours as needed for wheezing or shortness of breath.    . Blood Glucose Monitoring Suppl (ACCU-CHEK GUIDE) w/Device KIT 1 each by Does not apply route 2 (two) times a day. Use as instructed to check blood sugar twice daily. 1 kit 0  . glucose blood (ACCU-CHEK GUIDE) test strip Use Accu Chek Guide test strips as instructed to check blood sugar twice daily. 100 each 3     ROS: Constitutional: No fever or chills Vision: No changes in vision ENT: No difficulty swallowing CV: No chest pain Pulm: No SOB or wheezing GI: No nausea or vomiting GU: No urgency or inability to hold urine Skin: No poor wound healing Neurologic: No numbness or tingling Psychiatric: No depression or anxiety Heme: No bruising Allergic: No reaction to medications or food   Exam: Blood pressure 129/61, pulse 91, temperature 98.7 F (37.1 C), temperature source Oral, resp. rate 18, height 5' 2"  (1.575 m), weight 87.1 kg, last menstrual period 07/09/2011, SpO2 100 %. General: No acute distress Orientation:AAOx3 Mood and Affect: Cooperative and pleasant Gait: Antalgic gait Coordination and balance: WNL  RLE: Diffuse tenderness, limited ROM of the hip. Neurovascularly intact  LLE: Skin without lesions. No tenderness to palpation. Full painless ROM, full strength in each muscle groups without evidence of instability.   Medical Decision Making: Data: Imaging: Heterotopic ossification in rectus femoris  Labs:  Results for orders placed or performed during the hospital encounter of 10/14/18 (from the past 72 hour(s))  Glucose, capillary     Status: Abnormal   Collection Time: 10/14/18  6:33 AM  Result Value Ref Range   Glucose-Capillary 188 (H) 70 - 99 mg/dL    Imaging or Labs ordered: None  Medical history and chart was reviewed and case discussed with medical provider.  Assessment/Plan: 53 yo female w/ right hip heterotopic ossification  Plan for removal and excision. Risks and benefits discussed and the patient agrees to proceed. Will admit postoperatively and plan for radiation therapy.  Shona Needles, MD [facility] [phone] (phone) [phone] (office) [email or web address]

## 2018-10-14 NOTE — Progress Notes (Signed)
Spoke with Marissa RN at [facility] to let her know that the patient is scheduled for a radiation treatment tomorrow 10/15/2018 at 9:00 am.  I let her know that she would need to call [facility] and arrange transportation to have her here at 8:30 am.  I asked that she be given pain medication prior to transport.  She verbalized understanding.  Will continue to follow as necessary.  Gloriajean Dell. Leonie Green, BSN

## 2018-10-15 ENCOUNTER — Encounter: Payer: Self-pay | Admitting: Radiation Oncology

## 2018-10-15 ENCOUNTER — Ambulatory Visit: Payer: 59 | Attending: Radiation Oncology

## 2018-10-15 ENCOUNTER — Encounter (HOSPITAL_COMMUNITY): Payer: Self-pay | Admitting: Student

## 2018-10-15 ENCOUNTER — Ambulatory Visit: Admit: 2018-10-15 | Payer: 59 | Admitting: Radiation Oncology

## 2018-10-15 ENCOUNTER — Ambulatory Visit
Admit: 2018-10-15 | Discharge: 2018-10-15 | Disposition: A | Discharge status: Home / Self Care | Payer: 59 | Attending: Radiation Oncology | Admitting: Radiation Oncology

## 2018-10-15 LAB — CBC
HCT: 35 % — ABNORMAL LOW (ref 36.0–46.0)
Hemoglobin: 11.5 g/dL — ABNORMAL LOW (ref 12.0–15.0)
MCH: 29.9 pg (ref 26.0–34.0)
MCHC: 32.9 g/dL (ref 30.0–36.0)
MCV: 90.9 fL (ref 80.0–100.0)
Platelets: UNDETERMINED 10*3/uL (ref 150–400)
RBC: 3.85 MIL/uL — ABNORMAL LOW (ref 3.87–5.11)
RDW: 14.2 % (ref 11.5–15.5)
WBC: 11.2 10*3/uL — ABNORMAL HIGH (ref 4.0–10.5)
nRBC: 0 % (ref 0.0–0.2)

## 2018-10-15 LAB — BASIC METABOLIC PANEL
Anion gap: 11 (ref 5–15)
BUN: 9 mg/dL (ref 6–20)
CO2: 23 mmol/L (ref 22–32)
Calcium: 8.6 mg/dL — ABNORMAL LOW (ref 8.9–10.3)
Chloride: 104 mmol/L (ref 98–111)
Creatinine, Ser: 0.68 mg/dL (ref 0.44–1.00)
GFR calc Af Amer: >60 mL/min (ref >60)
GFR calc non Af Amer: >60 mL/min (ref >60)
Glucose, Bld: 141 mg/dL — ABNORMAL HIGH (ref 70–99)
Potassium: 3.9 mmol/L (ref 3.5–5.1)
Sodium: 138 mmol/L (ref 135–145)

## 2018-10-15 LAB — GLUCOSE, CAPILLARY
Glucose-Capillary: 137 mg/dL — ABNORMAL HIGH (ref 70–99)
Glucose-Capillary: 220 mg/dL — ABNORMAL HIGH (ref 70–99)

## 2018-10-15 MED ORDER — METHOCARBAMOL 750 MG PO TABS: 750.0000 mg | ORAL_TABLET | Freq: Four times a day (QID) | ORAL | 0 refills | Status: DC | PRN

## 2018-10-15 MED ORDER — HYDROCODONE-ACETAMINOPHEN 5-325 MG PO TABS: 1.0000 | ORAL_TABLET | Freq: Four times a day (QID) | ORAL | 0 refills | Status: DC | PRN

## 2018-10-15 MED ORDER — ASPIRIN 325 MG PO TABS: 325.0000 mg | ORAL_TABLET | Freq: Every day | ORAL | 0 refills | Status: DC

## 2018-10-15 NOTE — Discharge Instructions (Signed)
[facility] Discharge Instructions   General Discharge Instructions  WEIGHT BEARING STATUS: weightbearing as tolerated right leg  RANGE OF MOTION/ACTIVITY: Unrestricted range of motion right hip  Wound Care: Surgical dressing can be removed on Saturday (10/16/18). There is dermabond (skin glue) over incision, this will flake off in the next several weeks. Incisions can be left open to air if there is no drainage. If incision continues to have drainage, follow wound care instructions below. Okay to shower if no drainage from incisions.  DVT/PE prophylaxis: Aspirin 325 mg daily x 30 days  Diet: as you were eating previously.  Can use over the counter stool softeners and bowel preparations, such as Miralax, to help with bowel movements.  Narcotics can be constipating.  Be sure to drink plenty of fluids  PAIN MEDICATION USE AND EXPECTATIONS  You have likely been given narcotic medications to help control your pain.  After a traumatic event that results in an fracture (broken bone) with or without surgery, it is ok to use narcotic pain medications to help control one's pain.  We understand that everyone responds to pain differently and each individual patient will be evaluated on a regular basis for the continued need for narcotic medications. Ideally, narcotic medication use should last no more than 6-8 weeks (coinciding with fracture healing).   As a patient it is your responsibility as well to monitor narcotic medication use and report the amount and frequency you use these medications when you come to your office visit.   We would also advise that if you are using narcotic medications, you should take a dose prior to therapy to maximize you participation.  IF YOU ARE ON NARCOTIC MEDICATIONS IT IS NOT PERMISSIBLE TO OPERATE A MOTOR VEHICLE (MOTORCYCLE/CAR/TRUCK/MOPED) OR HEAVY MACHINERY DO NOT MIX NARCOTICS WITH OTHER CNS (CENTRAL NERVOUS SYSTEM) DEPRESSANTS SUCH AS  ALCOHOL   STOP SMOKING OR USING NICOTINE PRODUCTS!!!!  As discussed nicotine severely impairs your body's ability to heal surgical and traumatic wounds but also impairs bone healing.  Wounds and bone heal by forming microscopic blood vessels (angiogenesis) and nicotine is a vasoconstrictor (essentially, shrinks blood vessels).  Therefore, if vasoconstriction occurs to these microscopic blood vessels they essentially disappear and are unable to deliver necessary nutrients to the healing tissue.  This is one modifiable factor that you can do to dramatically increase your chances of healing your injury.    (This means no smoking, no nicotine gum, patches, etc)  DO NOT USE NONSTEROIDAL ANTI-INFLAMMATORY DRUGS (NSAID'S)  Using products such as Advil (ibuprofen), Aleve (naproxen), Motrin (ibuprofen) for additional pain control during fracture healing can delay and/or prevent the healing response.  If you would like to take over the counter (OTC) medication, Tylenol (acetaminophen) is ok.  However, some narcotic medications that are given for pain control contain acetaminophen as well. Therefore, you should not exceed more than 4000 mg of tylenol in a day if you do not have liver disease.  Also note that there are may OTC medicines, such as cold medicines and allergy medicines that my contain tylenol as well.  If you have any questions about medications and/or interactions please ask your doctor/PA or your pharmacist.      ICE AND ELEVATE INJURED/OPERATIVE EXTREMITY  Using ice and elevating the injured extremity above your heart can help with swelling and pain control.  Icing in a pulsatile fashion, such as 20 minutes on and 20 minutes off, can be followed.    Do not place ice  directly on skin. Make sure there is a barrier between to skin and the ice pack.    Using frozen items such as frozen peas works well as the conform nicely to the are that needs to be iced.  USE AN ACE WRAP OR TED HOSE FOR SWELLING  CONTROL  In addition to icing and elevation, Ace wraps or TED hose are used to help limit and resolve swelling.  It is recommended to use Ace wraps or TED hose until you are informed to stop.    When using Ace Wraps start the wrapping distally (farthest away from the body) and wrap proximally (closer to the body)   Example: If you had surgery on your leg or thing and you do not have a splint on, start the ace wrap at the toes and work your way up to the thigh        If you had surgery on your upper extremity and do not have a splint on, start the ace wrap at your fingers and work your way up to the upper arm   [place]: [phone]   VISIT OUR WEBSITE FOR ADDITIONAL INFORMATION: [email or web address]    Discharge Wound Care Instructions  Do NOT apply any ointments, solutions or lotions to pin sites or surgical wounds.  These prevent needed drainage and even though solutions like hydrogen peroxide kill bacteria, they also damage cells lining the pin sites that help fight infection.  Applying lotions or ointments can keep the wounds moist and can cause them to breakdown and open up as well. This can increase the risk for infection. When in doubt call the office.  Surgical incisions should be dressed daily.  If any drainage is noted, use one layer of adaptic, then gauze, Kerlix, and an ace wrap.  Once the incision is completely dry and without drainage, it may be left open to air out.  Showering may begin 36-48 hours later.  Cleaning gently with soap and water.  Traumatic wounds should be dressed daily as well.    One layer of adaptic, gauze, Kerlix, then ace wrap.  The adaptic can be discontinued once the draining has ceased    If you have a wet to dry dressing: wet the gauze with saline the squeeze as much saline out so the gauze is moist (not soaking wet), place moistened gauze over wound, then place a dry gauze over the moist one, followed by Kerlix wrap,  then ace wrap.

## 2018-10-15 NOTE — Progress Notes (Signed)
[facility] here to transport the patient to [facility] for radiology therapy.

## 2018-10-15 NOTE — Discharge Summary (Signed)
Orthopaedic Trauma Service (OTS) Discharge Summary   Patient ID: Veronica Fletcher MRN: [id number] DOB/AGE: [birth year] 52 y.o.  Admit date: 10/14/2018 Discharge date: 10/15/2018  Admission Diagnoses: heterotopic ossification of bone   Discharge Diagnoses:  Principal Problem:   Heterotopic ossification of bone Active Problems:   Heterotopic ossification   Past Medical History:  Diagnosis Date  . Asthma   . Diabetes mellitus   . Heart murmur   . Hyperlipidemia   . Hypertension   . Pancreatitis   . Pneumonia      Procedures Performed: CPT 27036-Removal of heterotopic ossification of right hip  Discharged Condition: good  Hospital Course: Patient presented to [facility] hospital on 10/14/18 for the above scheduled procedure for removal of heterotopic ossification. Was taken to operating room by Dr. Doreatha Martin for above procedure and tolerated this well without complications. Was admitted overnight for pain control and coordination of radiation therapy of her right hip. Was instructed to be weightbearing as tolerated on right leg post-operatively. Began working with therapy on afternoon of surgery, tolerated this well. Began ASA 325 mg for DVT prophylaxis starting on POD #1. Patient transported to [facility] on POD #1 for single dose of radiation therapy before being transported back to [facility] for continued physical therapy.   On 10/15/2018, the patient was tolerating diet, working well with therapies, pain well controlled, vital signs stable, dressings clean, dry, intact and felt stable for discharge to home. Patient will follow up as below and knows to call with questions or concerns.     Consults: Radiation Oncology  Significant Diagnostic Studies: None  Results for orders placed or performed during the hospital encounter of 10/14/18 (from the past 168 hour(s))  Glucose, capillary   Collection Time: 10/14/18  6:33 AM  Result Value Ref Range   Glucose-Capillary 188 (H) 70 - 99 mg/dL  Glucose, capillary   Collection Time: 10/14/18  9:17 AM  Result Value Ref Range   Glucose-Capillary 160 (H) 70 - 99 mg/dL  Glucose, capillary   Collection Time: 10/14/18 12:00 PM  Result Value Ref Range   Glucose-Capillary 179 (H) 70 - 99 mg/dL  Glucose, capillary   Collection Time: 10/14/18  4:43 PM  Result Value Ref Range   Glucose-Capillary 185 (H) 70 - 99 mg/dL  Glucose, capillary   Collection Time: 10/14/18 10:04 PM  Result Value Ref Range   Glucose-Capillary 173 (H) 70 - 99 mg/dL  CBC   Collection Time: 10/15/18  3:11 AM  Result Value Ref Range   WBC 11.2 (H) 4.0 - 10.5 K/uL   RBC 3.85 (L) 3.87 - 5.11 MIL/uL   Hemoglobin 11.5 (L) 12.0 - 15.0 g/dL   HCT 35.0 (L) 36.0 - 46.0 %   MCV 90.9 80.0 - 100.0 fL   MCH 29.9 26.0 - 34.0 pg   MCHC 32.9 30.0 - 36.0 g/dL   RDW 14.2 11.5 - 15.5 %   Platelets PLATELET CLUMPS NOTED ON SMEAR, UNABLE TO ESTIMATE 150 - 400 K/uL   nRBC 0.0 0.0 - 0.2 %  Basic metabolic panel   Collection Time: 10/15/18  3:11 AM  Result Value Ref Range   Sodium 138 135 - 145 mmol/L   Potassium 3.9 3.5 - 5.1 mmol/L   Chloride 104 98 - 111 mmol/L   CO2 23 22 - 32 mmol/L   Glucose, Bld 141 (H) 70 - 99 mg/dL   BUN 9 6 - 20 mg/dL   Creatinine, Ser 0.68 0.44 - 1.00 mg/dL  Calcium 8.6 (L) 8.9 - 10.3 mg/dL   GFR calc non Af Amer >60 >60 mL/min   GFR calc Af Amer >60 >60 mL/min   Anion gap 11 5 - 15  Glucose, capillary   Collection Time: 10/15/18  6:44 AM  Result Value Ref Range   Glucose-Capillary 137 (H) 70 - 99 mg/dL  Results for orders placed or performed during the hospital encounter of 10/12/18 (from the past 168 hour(s))  Basic metabolic panel   Collection Time: 10/12/18  9:20 AM  Result Value Ref Range   Sodium 138 135 - 145 mmol/L   Potassium 4.3 3.5 - 5.1 mmol/L   Chloride 104 98 - 111 mmol/L   CO2 24 22 - 32 mmol/L   Glucose, Bld 159 (H) 70 - 99 mg/dL   BUN 9 6 - 20 mg/dL   Creatinine, Ser 0.88 0.44 -  1.00 mg/dL   Calcium 9.1 8.9 - 10.3 mg/dL   GFR calc non Af Amer >60 >60 mL/min   GFR calc Af Amer >60 >60 mL/min   Anion gap 10 5 - 15  CBC   Collection Time: 10/12/18  9:20 AM  Result Value Ref Range   WBC 8.6 4.0 - 10.5 K/uL   RBC 4.46 3.87 - 5.11 MIL/uL   Hemoglobin 13.0 12.0 - 15.0 g/dL   HCT 42.1 36.0 - 46.0 %   MCV 94.4 80.0 - 100.0 fL   MCH 29.1 26.0 - 34.0 pg   MCHC 30.9 30.0 - 36.0 g/dL   RDW 14.4 11.5 - 15.5 %   Platelets 332 150 - 400 K/uL   nRBC 0.0 0.0 - 0.2 %  SARS CORONAVIRUS 2 (TAT 6-24 HRS) Nasopharyngeal Nasopharyngeal Swab   Collection Time: 10/12/18  9:54 AM   Specimen: Nasopharyngeal Swab  Result Value Ref Range   SARS Coronavirus 2 NEGATIVE NEGATIVE     Treatments: procedures: Radiation therapy right hip and surgery: Removal of heterotopic ossification of right hip  Discharge Exam: General -  RLE - Dressing clean, dry, intact. Tender with palpation around incision. Good hip and knee ROM. Mild weakness with hip flexion compared to contralateral leg. Motor and sensroy function intact distally. Neurovascularly intact. Ambulates well in room with use of rolling walker.  Disposition:   Discharge Instructions    Ambulatory referral to Physical Therapy   Complete by: As directed    Status post removal of heterotopic ossification right hip. Patient WBAT BLE, no restrictions     Allergies as of 10/15/2018      Reactions   Liraglutide Other (See Comments)   Other reaction(s): Pancreatitis *Victoza       Medication List    STOP taking these medications   aspirin EC 81 MG tablet Replaced by: aspirin 325 MG tablet   traMADol 50 MG tablet Commonly known as: ULTRAM     TAKE these medications   Accu-Chek FastClix Lancets Misc 1 each by Does not apply route 2 (two) times a day. Use Accu Chek Fastclix lancets to check blood sugar twice daily.   Accu-Chek Guide test strip Generic drug: glucose blood Use Accu Chek Guide test strips as instructed to  check blood sugar twice daily.   Accu-Chek Guide w/Device Kit 1 each by Does not apply route 2 (two) times a day. Use as instructed to check blood sugar twice daily.   albuterol 108 (90 Base) MCG/ACT inhaler Commonly known as: VENTOLIN HFA Inhale 1-2 puffs into the lungs every 4 (four) hours as needed for  wheezing or shortness of breath.   amLODipine 5 MG tablet Commonly known as: NORVASC Take 5 mg by mouth daily.   aspirin 325 MG tablet Take 1 tablet (325 mg total) by mouth daily. Replaces: aspirin EC 81 MG tablet   atorvastatin 10 MG tablet Commonly known as: LIPITOR TAKE 1 TABLET (10 MG TOTAL) BY MOUTH DAILY AT 6 PM.   diphenhydramine-acetaminophen 25-500 MG Tabs tablet Commonly known as: TYLENOL PM Take 2 tablets by mouth at bedtime.   Farxiga 5 MG Tabs tablet Generic drug: dapagliflozin propanediol TAKE 1 TABLET BY MOUTH EVERY DAY   fluticasone 50 MCG/ACT nasal spray Commonly known as: FLONASE Place 1 spray into both nostrils daily as needed for allergies or rhinitis.   HYDROcodone-acetaminophen 5-325 MG tablet Commonly known as: NORCO/VICODIN Take 1 tablet by mouth every 6 (six) hours as needed for severe pain.   metFORMIN 500 MG 24 hr tablet Commonly known as: GLUCOPHAGE-XR TAKE 4 TABLETS (2,000 MG TOTAL) BY MOUTH DAILY WITH SUPPER. What changed:   how much to take  when to take this  additional instructions   methocarbamol 750 MG tablet Commonly known as: ROBAXIN Take 1 tablet (750 mg total) by mouth every 6 (six) hours as needed for muscle spasms.   multivitamin with minerals tablet Take 1 tablet by mouth every morning. Patient takes Flinstones complete with Vitamin D   NovoLOG Mix 70/30 FlexPen (70-30) 100 UNIT/ML FlexPen Generic drug: insulin aspart protamine - aspart Inject 16-18 Units into the skin See admin instructions. 18 units in the morning and 16 units at night   ramipril 10 MG capsule Commonly known as: ALTACE Take 10 mg by mouth daily.    Semaglutide (1 MG/DOSE) 2 MG/1.5ML Sopn Commonly known as: Ozempic (1 MG/DOSE) Inject 1 mg into the skin once a week.   topiramate 50 MG tablet Commonly known as: TOPAMAX Take 50 mg by mouth at bedtime.   traZODone 50 MG tablet Commonly known as: DESYREL Take 50 mg by mouth at bedtime as needed for sleep.            Durable Medical Equipment  (From admission, onward)         Start     Ordered   10/15/18 0838  For home use only DME 3 n 1  Once     10/15/18 0838   10/15/18 0759  For home use only DME Walker rolling  Once    Question:  Patient needs a walker to treat with the following condition  Answer:  Heterotopic ossification of bone   10/15/18 0759         Follow-up Information    Haddix, Thomasene Lot, MD. Go on 11/01/2018.   Specialty: Orthopedic Surgery Why: Appointment at 10:15 AM on November 01, 2018 Contact information: [place] [address] [phone]           Discharge Instructions and Plan: Patient will be discharged to home. Have placed referral for outpatient therapy. Will be discharged on Aspirin for DVT prophylaxis. Patient has been provided with all the necessary DME for discharge. Patient will follow up with Dr. Doreatha Martin in 2 weeks for repeat x-rays.   Signed:  Leary Roca. Carmie Kanner ?([phone]? (phone) 10/15/2018, 12:10 PM  [facility] [place] [address] [phone] (F)

## 2018-10-15 NOTE — TOC Transition Note (Signed)
Transition of Care [facility]) - CM/SW Discharge Note   Patient Details  Name: Veronica Fletcher MRN: [id number] Date of Birth: December 12, 1965  [facility] CM/SW Contact:  Midge Minium RN, BSN, NCM-BC, ACM-RN [phone] Phone Number: 10/15/2018, 12:24 PM   Clinical Narrative:    Patient is medically stable to transition home. CM spoke with patient to discuss the POC. Patient states living at home and being independent PTA with no DME in use. Patient is s/p R heterotropic ossification of hip s/p resection with Outpatient PT (once cleared by MD) and DME recommended. DME preference provided with [employer] selected. DME referral given to Zack ([employer] liaison); AVS updated. Patient states she will have assistance at home post-discharge. No further needs from CM.    Final next level of care: Home/Self Care Barriers to Discharge: No Barriers Identified   Patient Goals and CMS Choice Patient states their goals for this hospitalization and ongoing recovery are:: "to continue recovering" CMS Medicare.gov Compare Post Acute Care list provided to:: Patient Choice offered to / list presented to : Patient     Discharge Plan and Services                DME Arranged: Bedside commode, Walker rolling DME Agency: [employer] Date DME Agency Contacted: 10/15/18 Time DME Agency Contacted: [phone] Representative spoke with at DME Agency: [place] (liaison) HH Arranged: NA [place] Agency: NA        Social Determinants of Health ([place]) Interventions     Readmission Risk Interventions No flowsheet data found.

## 2018-10-15 NOTE — Progress Notes (Signed)
Discharge instruction packet printed and was given to the patient.  The patient has all of the home equipment needed.  Verbalizes understanding discharge instructions.  The patient will be discharged to the home

## 2018-10-29 ENCOUNTER — Ambulatory Visit: Payer: 59 | Admitting: Physical Therapy

## 2018-11-05 ENCOUNTER — Other Ambulatory Visit: Payer: Self-pay

## 2018-11-05 DIAGNOSIS — E1165 Type 2 diabetes mellitus with hyperglycemia: Secondary | ICD-10-CM

## 2018-11-05 DIAGNOSIS — Z794 Long term (current) use of insulin: Secondary | ICD-10-CM

## 2018-11-08 MED ORDER — METFORMIN HCL ER 500 MG PO TB24: 2000.0000 mg | ORAL_TABLET | Freq: Every day | ORAL | 0 refills | Status: DC

## 2018-11-09 ENCOUNTER — Other Ambulatory Visit: Payer: Self-pay

## 2018-11-09 ENCOUNTER — Encounter: Payer: Self-pay | Admitting: Physical Therapy

## 2018-11-09 ENCOUNTER — Ambulatory Visit: Payer: 59 | Attending: Student | Admitting: Physical Therapy

## 2018-11-09 DIAGNOSIS — M25551 Pain in right hip: Secondary | ICD-10-CM | POA: Diagnosis not present

## 2018-11-09 DIAGNOSIS — M6281 Muscle weakness (generalized): Secondary | ICD-10-CM | POA: Diagnosis present

## 2018-11-09 DIAGNOSIS — R262 Difficulty in walking, not elsewhere classified: Secondary | ICD-10-CM | POA: Insufficient documentation

## 2018-11-09 NOTE — Therapy (Signed)
[place], [place], [address] Phone: [phone]   Fax:  [phone]  Physical Therapy Evaluation  Patient Details  Name: Veronica Fletcher MRN: [id number] Date of Birth: 11/02/1965 Referring Provider (PT): Katha Hamming, MD   Encounter Date: 11/09/2018  PT End of Session - 11/09/18 0838    Visit Number  1    Number of Visits  16    Date for PT Re-Evaluation  01/07/19    Authorization Type  UHC    PT Start Time  0825    PT Stop Time  0915    PT Time Calculation (min)  50 min    Activity Tolerance  Patient tolerated treatment well    Behavior During Therapy  [facility] for tasks assessed/performed       Past Medical History:  Diagnosis Date  . Asthma   . Diabetes mellitus   . Heart murmur   . Hyperlipidemia   . Hypertension   . Pancreatitis   . Pneumonia     Past Surgical History:  Procedure Laterality Date  . ABDOMINAL HYSTERECTOMY    . CESAREAN SECTION    . CHOLECYSTECTOMY    . GANGLION CYST EXCISION    . HIP CAPSULECTOMY Right 10/14/2018   Procedure: RIGHT HIP HETEROTOPIC OSSIFICATION RESECTION;  Surgeon: Shona Needles, MD;  Location: [place];  Service: Orthopedics;  Laterality: Right;  . LAPAROSCOPY      There were no vitals filed for this visit.   Subjective Assessment - 11/09/18 0833    Subjective  Pt arriving to therpay s/p R hip heterotopic ossification resection performed by Dr. Lennette Bihari Haddix. Pt reporting 8/10 pain today at rest and reports that she didn't take pain meds last night because she would be driving this morning.    Pertinent History  asthma, HTN, heart murmur, hyperlipidemia, pneumonia, pancreatitis due to allergic reaction to medicaiton, DM    Limitations  Sitting;House hold activities;Standing    Diagnostic tests  x-ray following surgery    Patient Stated Goals  Stop hurting    Currently in Pain?  Yes    Pain Score  8     Pain Location  Hip    Pain Orientation  Right    Pain Descriptors / Indicators  Aching;Numbness    Pain Type  Surgical pain    Pain Onset  1 to 4 weeks ago    Pain Frequency  Constant    Aggravating Factors   standing long periods    Pain Relieving Factors  pain meds, ice, elevation         [facility] PT Assessment - 11/09/18 0001      Assessment   Medical Diagnosis  R heterotopic ossification resection 10/14/2018    Referring Provider (PT)  Katha Hamming, MD    Onset Date/Surgical Date  10/14/18    Hand Dominance  Right    Next MD Visit  Nov 29, 2018    Prior Therapy  for back      Precautions   Precautions  None      Restrictions   Weight Bearing Restrictions  No      Balance Screen   Has the patient fallen in the past 6 months  Yes    How many times?  1   tripped over cord in her room   Has the patient had a decrease in activity level because of a fear of falling?   No    Is the patient reluctant to  leave their home because of a fear of falling?   No      Home Social worker  Private residence    Living Arrangements  Spouse/significant other;Children    Type of [place] to enter    Entrance Stairs-Number of Steps  5    Entrance Stairs-Rails  Cannot reach both    [place]  Bedside commode;Shower seat      Prior Function   Level of Independence  Independent    Vocation  Other (comment)    Vocation Requirements  works for [employer], pt is currently on medical leave    Leisure  read and sing      Cognition   Overall Cognitive Status  Within Functional Limits for tasks assessed      Observation/Other Assessments   Focus on Therapeutic Outcomes (FOTO)   64 % limitation      ROM / Strength   AROM / PROM / Strength  AROM;Strength      AROM   AROM Assessment Site  Hip    Right/Left Hip  Right    Right Hip Flexion  45    Right Hip ABduction  20      Strength   Strength Assessment Site  Hip    Right/Left Hip  Right;Left    Right Hip Flexion  3-/5     Right Hip Extension  3-/5    Right Hip ABduction  3-/5    Right Hip ADduction  3-/5    Left Hip Flexion  4/5    Left Hip Extension  4/5    Left Hip ABduction  4/5    Left Hip ADduction  4/5      Palpation   Palpation comment  TTP lateral hip, increased swelling noted, instructed pt in ice and elevation      Transfers   Five time sit to stand comments   42.4 seconds with UE support, pt reporting pain down R thigh      Ambulation/Gait   Gait Comments  Pt amb with antalgic gait pattern, decreased stance on R LE and decreased heel strke                Objective measurements completed on examination: See above findings.              PT Education - 11/09/18 [id number]    Education Details  PT POC, HEP    Person(s) Educated  Patient    Methods  Explanation;Demonstration;Handout    Comprehension  Returned demonstration;Verbalized understanding       PT Short Term Goals - 11/09/18 0839      PT SHORT TERM GOAL #1   Title  independent with initial HEP     Baseline  beginning HEP issued at evaluation    Time  4    Status  New      PT SHORT TERM GOAL #2   Title  Pt will be able to tolerate standing for 20 minutes in order to prepare meals with pain </= 3/10.    Baseline  Pt reporting only able to stand for 5- 10 minutes at a time.    Time  4    Period  Weeks    Status  New      PT SHORT TERM GOAL #3   Title  -      PT SHORT TERM GOAL #4   Title  -  PT Long Term Goals - 11/09/18 [id number]      PT LONG TERM GOAL #1   Title  independent with HEP and understand how to progress herself    Time  8    Period  Weeks    Status  New      PT LONG TERM GOAL #2   Title  Pt able to improve R hip strength to >/= 4+/5 in order to improve funcitonal mobility.    Time  8    Period  Weeks    Status  New    Target Date  01/04/19      PT LONG TERM GOAL #3   Title  Pt will improve her FOTO score from 64% limitation to </= 44% limitation.    Baseline  64% limited on  11/09/2018    Period  Days    Status  New    Target Date  01/04/19      PT LONG TERM GOAL #4   Title  Pt will be able to amb to grocery store with pain </= 3/10 in R hip.    Time  8    Period  Weeks    Status  New    Target Date  01/04/19      PT LONG TERM GOAL #5   Title  Pt will improve R hip flexion in order to pick up bag of groceries off the floor without difficutly.    Baseline  unable to pick up objects off the floor, hip flexion: 45 degrees    Time  8    Period  Weeks    Status  New    Target Date  01/04/19             Plan - 11/09/18 0941    Clinical Impression Statement  Pt arriving to therapy today s/p heterotopic ossification on 10/14/2018. Pt presenting with weakness and decreased ROM in R hip. Pt also with swelling noted around groin and upper thigh. Pt also amb with no assistive device and antalgic gait. Skilled PT needed to progress toward goals set.    Personal Factors and Comorbidities  Comorbidity 3+    Comorbidities  asthma, HTN, heart murmur, hyperlipidemia, pneumonia, pancreatitis due to allergic reaction to medicaiton, DM    Examination-Activity Limitations  Dressing;Stairs;Squat;Stand;Sit    Examination-Participation Restrictions  Community Activity    Stability/Clinical Decision Making  Stable/Uncomplicated    Clinical Decision Making  Low    Rehab Potential  Good    PT Frequency  2x / week    PT Duration  8 weeks    PT Treatment/Interventions  ADLs/Self Care Home Management;Cryotherapy;Electrical Stimulation;Moist Heat;Therapeutic activities;Functional mobility training;Stair training;Gait training;Therapeutic exercise;Balance training;Neuromuscular re-education;Patient/family education;Taping;Manual techniques    PT Next Visit Plan  LE strengthening, R hip ROM, stretching, hamstring stretching, ice, add to exercise plan as appropriate and progess to standing exercises    PT Home Exercise Plan  ankle pumps, heel slides, LAQ    Consulted and Agree  with Plan of Care  Patient       Patient will benefit from skilled therapeutic intervention in order to improve the following deficits and impairments:  Pain, Decreased strength, Decreased range of motion, Difficulty walking, Decreased activity tolerance  Visit Diagnosis: Pain in right hip  Muscle weakness (generalized)  Difficulty in walking, not elsewhere classified     Problem List Patient Active Problem List   Diagnosis Date Noted  . Heterotopic ossification 10/14/2018  . Heterotopic ossification of bone 09/27/2018  .  HTN (hypertension) 02/09/2013  . Type II or unspecified type diabetes mellitus with unspecified complication, uncontrolled 02/09/2013  . DKA (diabetic ketoacidoses) ([place]) 02/06/2013  . Acute pancreatitis 02/06/2013  . Menorrhagia 08/13/2011  . Fibroids, submucosal 08/13/2011  . Anemia 08/13/2011    Oretha Caprice, PT 11/09/2018, 9:51 AM  [facility] [address] [place], [place], [address] Phone: [phone]   Fax:  [phone]  Name: Veronica Fletcher MRN: [id number] Date of Birth: 05/07/1965

## 2018-11-15 ENCOUNTER — Ambulatory Visit: Payer: 59 | Attending: Student | Admitting: Physical Therapy

## 2018-11-15 ENCOUNTER — Encounter: Payer: Self-pay | Admitting: Physical Therapy

## 2018-11-15 ENCOUNTER — Other Ambulatory Visit: Payer: Self-pay

## 2018-11-15 DIAGNOSIS — R262 Difficulty in walking, not elsewhere classified: Secondary | ICD-10-CM | POA: Insufficient documentation

## 2018-11-15 DIAGNOSIS — M542 Cervicalgia: Secondary | ICD-10-CM | POA: Insufficient documentation

## 2018-11-15 DIAGNOSIS — M25551 Pain in right hip: Secondary | ICD-10-CM | POA: Insufficient documentation

## 2018-11-15 DIAGNOSIS — R293 Abnormal posture: Secondary | ICD-10-CM | POA: Diagnosis present

## 2018-11-15 DIAGNOSIS — M6281 Muscle weakness (generalized): Secondary | ICD-10-CM | POA: Diagnosis present

## 2018-11-15 DIAGNOSIS — M25511 Pain in right shoulder: Secondary | ICD-10-CM | POA: Insufficient documentation

## 2018-11-15 NOTE — Therapy (Signed)
[place], [place], [address] Phone: [phone]   Fax:  [phone]  Physical Therapy Treatment  Patient Details  Name: Veronica Fletcher MRN: [id number] Date of Birth: [birth year] Referring Provider (PT): Katha Hamming, MD   Encounter Date: 11/15/2018  PT End of Session - 11/15/18 1117    Visit Number  2    Number of Visits  16    Date for PT Re-Evaluation  01/07/19    Authorization Type  UHC    PT Start Time  1100    PT Stop Time  1140    PT Time Calculation (min)  40 min    Activity Tolerance  Patient tolerated treatment well;Patient limited by pain    Behavior During Therapy  [facility] for tasks assessed/performed       Past Medical History:  Diagnosis Date  . Asthma   . Diabetes mellitus   . Heart murmur   . Hyperlipidemia   . Hypertension   . Pancreatitis   . Pneumonia     Past Surgical History:  Procedure Laterality Date  . ABDOMINAL HYSTERECTOMY    . CESAREAN SECTION    . CHOLECYSTECTOMY    . GANGLION CYST EXCISION    . HIP CAPSULECTOMY Right 10/14/2018   Procedure: RIGHT HIP HETEROTOPIC OSSIFICATION RESECTION;  Surgeon: Shona Needles, MD;  Location: [place];  Service: Orthopedics;  Laterality: Right;  . LAPAROSCOPY      There were no vitals filed for this visit.  Subjective Assessment - 11/15/18 1111    Subjective  Pt arriving to therpay reporting 6/10 pain. Pt reporting pain numbness down Lateral thigh    Pertinent History  asthma, HTN, heart murmur, hyperlipidemia, pneumonia, pancreatitis due to allergic reaction to medicaiton, DM    Limitations  Sitting;House hold activities;Standing    Diagnostic tests  x-ray following surgery    Patient Stated Goals  Stop hurting    Currently in Pain?  Yes    Pain Score  6     Pain Location  Hip    Pain Orientation  Right    Pain Descriptors / Indicators  Aching;Numbness    Pain Type  Surgical pain    Pain Onset  1 to 4 weeks ago    Pain  Frequency  Constant                       [facility] Adult PT Treatment/Exercise - 11/15/18 0001      Exercises   Exercises  Knee/Hip      Knee/Hip Exercises: Stretches   Passive Hamstring Stretch  Right;2 reps;30 seconds      Knee/Hip Exercises: Seated   Long Arc Quad  Strengthening;10 reps      Knee/Hip Exercises: Supine   Short Arc Quad Sets  Strengthening;Right;10 reps    Heel Slides  Strengthening;Right;2 sets;10 reps    Straight Leg Raises  Strengthening;Right;2 sets;5 reps    Straight Leg Raises Limitations  limited lift off to about 6 inches off the mat table    Knee Flexion  Strengthening;10 reps    Other Supine Knee/Hip Exercises  clam shells with red theraband, ball squeezes x 15 reps holding 5 seconds each      Modalities   Modalities  Cryotherapy      Cryotherapy   Number Minutes Cryotherapy  10 Minutes    Cryotherapy Location  Hip    Type of Cryotherapy  Ice pack  Manual Therapy   Manual Therapy  Soft tissue mobilization    Soft tissue mobilization  Lateral thigh, IT band, anterior quad               PT Short Term Goals - 11/15/18 1122      PT SHORT TERM GOAL #1   Title  independent with initial HEP     Baseline  beginning HEP issued at evaluation    Time  4    Period  Weeks    Status  On-going      PT SHORT TERM GOAL #2   Title  Pt will be able to tolerate standing for 20 minutes in order to prepare meals with pain </= 3/10.    Baseline  Pt reporting only able to stand for 5- 10 minutes at a time.    Time  4    Period  Weeks    Status  On-going        PT Long Term Goals - 11/09/18 [id number]      PT LONG TERM GOAL #1   Title  independent with HEP and understand how to progress herself    Time  8    Period  Weeks    Status  New      PT LONG TERM GOAL #2   Title  Pt able to improve R hip strength to >/= 4+/5 in order to improve funcitonal mobility.    Time  8    Period  Weeks    Status  New    Target Date  01/04/19       PT LONG TERM GOAL #3   Title  Pt will improve her FOTO score from 64% limitation to </= 44% limitation.    Baseline  64% limited on 11/09/2018    Period  Days    Status  New    Target Date  01/04/19      PT LONG TERM GOAL #4   Title  Pt will be able to amb to grocery store with pain </= 3/10 in R hip.    Time  8    Period  Weeks    Status  New    Target Date  01/04/19      PT LONG TERM GOAL #5   Title  Pt will improve R hip flexion in order to pick up bag of groceries off the floor without difficutly.    Baseline  unable to pick up objects off the floor, hip flexion: 45 degrees    Time  8    Period  Weeks    Status  New    Target Date  01/04/19            Plan - 11/15/18 1118    Clinical Impression Statement  Pt arriving to therpay reporting 6/10 pain in her R hip. Pt reporting numbness down groin and anterior/lateral thigh. Pt tender to palpation along IT band and hip flexors. Pt reporting less pain following Manual therapy and  ice. Continue with skilled PT.    Personal Factors and Comorbidities  Comorbidity 3+    Comorbidities  asthma, HTN, heart murmur, hyperlipidemia, pneumonia, pancreatitis due to allergic reaction to medicaiton, DM    Examination-Activity Limitations  Dressing;Stairs;Squat;Stand;Sit    Examination-Participation Restrictions  Community Activity    Stability/Clinical Decision Making  Stable/Uncomplicated    Rehab Potential  Good    PT Frequency  2x / week    PT Duration  8 weeks  PT Treatment/Interventions  ADLs/Self Care Home Management;Cryotherapy;Electrical Stimulation;Moist Heat;Therapeutic activities;Functional mobility training;Stair training;Gait training;Therapeutic exercise;Balance training;Neuromuscular re-education;Patient/family education;Taping;Manual techniques    PT Next Visit Plan  LE strengthening, R hip ROM, stretching, hamstring stretching, ice, add to exercise plan as appropriate and progess to standing exercises    PT Home  Exercise Plan  Access Code: [email or web address]  (ankle pumps, heel slides, LAQ, clam shells with red theraband, SAQ)    Consulted and Agree with Plan of Care  Patient       Patient will benefit from skilled therapeutic intervention in order to improve the following deficits and impairments:  Pain, Decreased strength, Decreased range of motion, Difficulty walking, Decreased activity tolerance  Visit Diagnosis: Pain in right hip  Muscle weakness (generalized)  Difficulty in walking, not elsewhere classified     Problem List Patient Active Problem List   Diagnosis Date Noted  . Heterotopic ossification 10/14/2018  . Heterotopic ossification of bone 09/27/2018  . HTN (hypertension) 02/09/2013  . Type II or unspecified type diabetes mellitus with unspecified complication, uncontrolled 02/09/2013  . DKA (diabetic ketoacidoses) ([place]) 02/06/2013  . Acute pancreatitis 02/06/2013  . Menorrhagia 08/13/2011  . Fibroids, submucosal 08/13/2011  . Anemia 08/13/2011    Oretha Caprice, PT 11/15/2018, 12:06 PM  [facility] [address] [place], [place], [address] Phone: [phone]   Fax:  [phone]  Name: Veronica Fletcher MRN: [id number] Date of Birth: 02-May-1965

## 2018-11-19 ENCOUNTER — Ambulatory Visit: Payer: 59 | Admitting: Physical Therapy

## 2018-11-19 ENCOUNTER — Other Ambulatory Visit: Payer: Self-pay

## 2018-11-19 DIAGNOSIS — M25511 Pain in right shoulder: Secondary | ICD-10-CM

## 2018-11-19 DIAGNOSIS — M25551 Pain in right hip: Secondary | ICD-10-CM

## 2018-11-19 DIAGNOSIS — M542 Cervicalgia: Secondary | ICD-10-CM

## 2018-11-19 DIAGNOSIS — R293 Abnormal posture: Secondary | ICD-10-CM

## 2018-11-19 DIAGNOSIS — M6281 Muscle weakness (generalized): Secondary | ICD-10-CM

## 2018-11-19 DIAGNOSIS — R262 Difficulty in walking, not elsewhere classified: Secondary | ICD-10-CM

## 2018-11-19 NOTE — Therapy (Signed)
[place], [place], [address] Phone: [phone]   Fax:  [phone]  Physical Therapy Treatment  Patient Details  Name: Veronica Fletcher MRN: [id number] Date of Birth: November 29, 1965 Referring Provider (PT): Katha Hamming, MD   Encounter Date: 11/19/2018  PT End of Session - 11/19/18 0932    Visit Number  3    Number of Visits  16    Date for PT Re-Evaluation  01/07/19    Authorization Type  UHC    PT Start Time  0920    PT Stop Time  1005    PT Time Calculation (min)  45 min    Activity Tolerance  Patient tolerated treatment well;Patient limited by pain    Behavior During Therapy  [facility] for tasks assessed/performed       Past Medical History:  Diagnosis Date  . Asthma   . Diabetes mellitus   . Heart murmur   . Hyperlipidemia   . Hypertension   . Pancreatitis   . Pneumonia     Past Surgical History:  Procedure Laterality Date  . ABDOMINAL HYSTERECTOMY    . CESAREAN SECTION    . CHOLECYSTECTOMY    . GANGLION CYST EXCISION    . HIP CAPSULECTOMY Right 10/14/2018   Procedure: RIGHT HIP HETEROTOPIC OSSIFICATION RESECTION;  Surgeon: Shona Needles, MD;  Location: [place];  Service: Orthopedics;  Laterality: Right;  . LAPAROSCOPY      There were no vitals filed for this visit.  Subjective Assessment - 11/19/18 0928    Subjective  Pt arriving to therpay today reporting 6/10 pain in R lateral hip/thigh with numbness noted.    Pertinent History  asthma, HTN, heart murmur, hyperlipidemia, pneumonia, pancreatitis due to allergic reaction to medicaiton, DM    Limitations  Sitting;House hold activities;Standing    Diagnostic tests  x-ray following surgery    Patient Stated Goals  Stop hurting    Currently in Pain?  Yes    Pain Score  6     Pain Location  Hip    Pain Orientation  Right    Pain Descriptors / Indicators  Aching;Numbness    Pain Type  Surgical pain    Pain Onset  1 to 4 weeks ago                        [facility] Adult PT Treatment/Exercise - 11/19/18 0001      Exercises   Exercises  Knee/Hip      Knee/Hip Exercises: Stretches   Passive Hamstring Stretch  Right;2 reps;30 seconds      Knee/Hip Exercises: Aerobic   Nustep  L1 x 5 minutes      Knee/Hip Exercises: Seated   Long Arc Quad  Strengthening;10 reps      Knee/Hip Exercises: Supine   Short Arc Quad Sets  Strengthening;Right;10 reps    Heel Slides  Strengthening;Right;2 sets;10 reps    Straight Leg Raises  --    Straight Leg Raises Limitations  attempted today but pt with incrased pain over incision site    Knee Flexion  Strengthening;10 reps    Other Supine Knee/Hip Exercises  clam shells with red theraband, ball squeezes x 15 reps holding 5 seconds each      Modalities   Modalities  Cryotherapy      Cryotherapy   Number Minutes Cryotherapy  10 Minutes    Cryotherapy Location  Hip    Type of Cryotherapy  Ice pack      Manual Therapy   Manual Therapy  Soft tissue mobilization    Soft tissue mobilization  Lateral thigh, IT band, anterior quad   rolling to IT band and latera and anterior thigh              PT Short Term Goals - 11/19/18 0938      PT SHORT TERM GOAL #1   Title  independent with initial HEP     Baseline  beginning HEP issued at evaluation    Time  4    Status  On-going    Target Date  06/24/18      PT SHORT TERM GOAL #2   Title  Pt will be able to tolerate standing for 20 minutes in order to prepare meals with pain </= 3/10.    Baseline  Pt reporting only able to stand for 5- 10 minutes at a time.    Period  Weeks    Status  On-going    Target Date  06/24/18        PT Long Term Goals - 11/09/18 0842      PT LONG TERM GOAL #1   Title  independent with HEP and understand how to progress herself    Time  8    Period  Weeks    Status  New      PT LONG TERM GOAL #2   Title  Pt able to improve R hip strength to >/= 4+/5 in order to improve funcitonal  mobility.    Time  8    Period  Weeks    Status  New    Target Date  01/04/19      PT LONG TERM GOAL #3   Title  Pt will improve her FOTO score from 64% limitation to </= 44% limitation.    Baseline  64% limited on 11/09/2018    Period  Days    Status  New    Target Date  01/04/19      PT LONG TERM GOAL #4   Title  Pt will be able to amb to grocery store with pain </= 3/10 in R hip.    Time  8    Period  Weeks    Status  New    Target Date  01/04/19      PT LONG TERM GOAL #5   Title  Pt will improve R hip flexion in order to pick up bag of groceries off the floor without difficutly.    Baseline  unable to pick up objects off the floor, hip flexion: 45 degrees    Time  8    Period  Weeks    Status  New    Target Date  01/04/19            Plan - 11/19/18 0935    Clinical Impression Statement  Pt arriving today reporting 6/10 pain in R hip/thigh. Pt will with tenderness noted in Lateral thigh/IT band and femoral head. Pt tolerating all exercises, but limitied by pain and requiring increased time for transition movements. Pt with mild opening at the end of her incision. Pt instructed to wash when she got home and place a bandage/covering over the area. Conitnue skilled PT.    Personal Factors and Comorbidities  Comorbidity 3+    Comorbidities  asthma, HTN, heart murmur, hyperlipidemia, pneumonia, pancreatitis due to allergic reaction to medicaiton, DM    Examination-Activity Limitations  Dressing;Stairs;Squat;Stand;Sit  Examination-Participation Restrictions  Community Activity    Stability/Clinical Decision Making  Stable/Uncomplicated    Rehab Potential  Good    PT Frequency  2x / week    PT Duration  8 weeks    PT Treatment/Interventions  ADLs/Self Care Home Management;Cryotherapy;Electrical Stimulation;Moist Heat;Therapeutic activities;Functional mobility training;Stair training;Gait training;Therapeutic exercise;Balance training;Neuromuscular  re-education;Patient/family education;Taping;Manual techniques    PT Next Visit Plan  LE strengthening, R hip ROM, stretching, hamstring stretching, ice, add to exercise plan as appropriate and progess to standing exercises    PT Home Exercise Plan  Access Code: [email or web address]  (ankle pumps, heel slides, LAQ, clam shells with red theraband, SAQ)    Consulted and Agree with Plan of Care  Patient       Patient will benefit from skilled therapeutic intervention in order to improve the following deficits and impairments:  Pain, Decreased strength, Decreased range of motion, Difficulty walking, Decreased activity tolerance  Visit Diagnosis: Pain in right hip  Muscle weakness (generalized)  Difficulty in walking, not elsewhere classified  Cervicalgia  Acute pain of right shoulder  Abnormal posture     Problem List Patient Active Problem List   Diagnosis Date Noted  . Heterotopic ossification 10/14/2018  . Heterotopic ossification of bone 09/27/2018  . HTN (hypertension) 02/09/2013  . Type II or unspecified type diabetes mellitus with unspecified complication, uncontrolled 02/09/2013  . DKA (diabetic ketoacidoses) ([place]) 02/06/2013  . Acute pancreatitis 02/06/2013  . Menorrhagia 08/13/2011  . Fibroids, submucosal 08/13/2011  . Anemia 08/13/2011    Oretha Caprice, PT 11/19/2018, 10:12 AM  [facility] [address] [place], [place], [address] Phone: [phone]   Fax:  [phone]  Name: Rien Mcgurl MRN: [id number] Date of Birth: 07-Mar-1965

## 2018-11-23 ENCOUNTER — Other Ambulatory Visit: Payer: Self-pay

## 2018-11-23 ENCOUNTER — Encounter: Payer: Self-pay | Admitting: Physical Therapy

## 2018-11-23 ENCOUNTER — Ambulatory Visit: Payer: 59 | Admitting: Physical Therapy

## 2018-11-23 ENCOUNTER — Telehealth: Payer: Self-pay | Admitting: Endocrinology

## 2018-11-23 DIAGNOSIS — M25551 Pain in right hip: Secondary | ICD-10-CM | POA: Diagnosis not present

## 2018-11-23 DIAGNOSIS — M6281 Muscle weakness (generalized): Secondary | ICD-10-CM

## 2018-11-23 DIAGNOSIS — R262 Difficulty in walking, not elsewhere classified: Secondary | ICD-10-CM

## 2018-11-23 MED ORDER — NOVOLOG MIX 70/30 FLEXPEN (70-30) 100 UNIT/ML ~~LOC~~ SUPN: 16.0000 [IU] | PEN_INJECTOR | SUBCUTANEOUS | 2 refills | Status: DC

## 2018-11-23 MED ORDER — PEN NEEDLES 30G X 5 MM MISC: 1.0000 | Freq: Two times a day (BID) | 2 refills | Status: DC

## 2018-11-23 NOTE — Telephone Encounter (Signed)
MEDICATION: NOVOLOG MIX 70/30 FLEXPEN (70-30) 100 UNIT/ML FlexPen AND Nano Ultra Fine Pen Needles  PHARMACY:   [id number] - [place], [place] [phone] (Phone) [phone] (Fax)     IS THIS A 90 DAY SUPPLY : Yes  IS PATIENT OUT OF MEDICATION: No  IF NOT; HOW MUCH IS LEFT: 1 pen  LAST APPOINTMENT DATE: @10 /23/2020  NEXT APPOINTMENT DATE:@12 /08/2018  DO WE HAVE YOUR PERMISSION TO LEAVE A DETAILED MESSAGE: Yes  OTHER COMMENTS:    **Let patient know to contact pharmacy at the end of the day to make sure medication is ready. **  ** Please notify patient to allow 48-72 hours to process**  **Encourage patient to contact the pharmacy for refills or they can request refills through [facility]**

## 2018-11-23 NOTE — Telephone Encounter (Signed)
Rx sent 

## 2018-11-23 NOTE — Therapy (Signed)
[facility] [place], [place], [address] Phone: [phone]   Fax:  [phone]  Physical Therapy Treatment  Patient Details  Name: Veronica Fletcher MRN: [id number] Date of Birth: [birth year] Referring Provider (PT): Katha Hamming, MD   Encounter Date: 11/23/2018  PT End of Session - 11/23/18 0807    Visit Number  4    Number of Visits  16    Date for PT Re-Evaluation  01/07/19    Authorization Type  U[employer]    PT Start Time  0803    PT Stop Time  0850    PT Time Calculation (min)  47 min    Activity Tolerance  Patient tolerated treatment well    Behavior During Therapy  [facility] for tasks assessed/performed       Past Medical History:  Diagnosis Date  . Asthma   . Diabetes mellitus   . Heart murmur   . Hyperlipidemia   . Hypertension   . Pancreatitis   . Pneumonia     Past Surgical History:  Procedure Laterality Date  . ABDOMINAL HYSTERECTOMY    . CESAREAN SECTION    . CHOLECYSTECTOMY    . GANGLION CYST EXCISION    . HIP CAPSULECTOMY Right 10/14/2018   Procedure: RIGHT HIP HETEROTOPIC OSSIFICATION RESECTION;  Surgeon: Shona Needles, MD;  Location: [place];  Service: Orthopedics;  Laterality: Right;  . LAPAROSCOPY      There were no vitals filed for this visit.  Subjective Assessment - 11/23/18 0805    Subjective  I still have some numbness in my thigh. Some pain here- pointing to ASIS. I have been elevating and icing. Walking slow but going well.    Patient Stated Goals  Stop hurting    Currently in Pain?  Yes    Pain Score  6     Pain Location  Hip    Pain Orientation  Right    Pain Descriptors / Indicators  Sharp    Pain Relieving Factors  rest                       [facility] Adult PT Treatment/Exercise - 11/23/18 0001      Knee/Hip Exercises: Standing   Other Standing Knee Exercises  lateral weight shift- wider stance for posture    Other Standing Knee Exercises  glut sets       Knee/Hip Exercises: Seated   Other Seated Knee/Hip Exercises  hip hinge- hands on back of chair      Knee/Hip Exercises: Supine   Other Supine Knee/Hip Exercises  ankle pumps in supine ankle over towel roll      Knee/Hip Exercises: Prone   Hamstring Curl  10 reps    Other Prone Exercises  glut set      Cryotherapy   Number Minutes Cryotherapy  10 Minutes   3 min with education   Cryotherapy Location  Hip    Type of Cryotherapy  Ice pack      Manual Therapy   Manual therapy comments  edu on self rolling    Soft tissue mobilization  roller- focus in VL and ITB             PT Education - 11/23/18 1205    Education Details  exercise form/rationale, POC, hip hinge vs lumbar flexion    Person(s) Educated  Patient    Methods  Explanation;Demonstration;Tactile cues;Verbal cues    Comprehension  Returned demonstration;Verbalized understanding;Verbal cues required;Tactile  cues required;Need further instruction       PT Short Term Goals - 11/19/18 [id number]      PT SHORT TERM GOAL #1   Title  independent with initial HEP     Baseline  beginning HEP issued at evaluation    Time  4    Status  On-going    Target Date  06/24/18      PT SHORT TERM GOAL #2   Title  Pt will be able to tolerate standing for 20 minutes in order to prepare meals with pain </= 3/10.    Baseline  Pt reporting only able to stand for 5- 10 minutes at a time.    Period  Weeks    Status  On-going    Target Date  06/24/18        PT Long Term Goals - 11/09/18 0842      PT LONG TERM GOAL #1   Title  independent with HEP and understand how to progress herself    Time  8    Period  Weeks    Status  New      PT LONG TERM GOAL #2   Title  Pt able to improve R hip strength to >/= 4+/5 in order to improve funcitonal mobility.    Time  8    Period  Weeks    Status  New    Target Date  01/04/19      PT LONG TERM GOAL #3   Title  Pt will improve her FOTO score from 64% limitation to </= 44% limitation.     Baseline  64% limited on 11/09/2018    Period  Days    Status  New    Target Date  01/04/19      PT LONG TERM GOAL #4   Title  Pt will be able to amb to grocery store with pain </= 3/10 in R hip.    Time  8    Period  Weeks    Status  New    Target Date  01/04/19      PT LONG TERM GOAL #5   Title  Pt will improve R hip flexion in order to pick up bag of groceries off the floor without difficutly.    Baseline  unable to pick up objects off the floor, hip flexion: 45 degrees    Time  8    Period  Weeks    Status  New    Target Date  01/04/19            Plan - 11/23/18 0826    Clinical Impression Statement  Significant tightness in quads, hip flexors and hamstrings resulting in limited mechanical motion. Added prone exercises to HEP to encourage further hip extension and standing for postural alignment. Lacking necessary mobility for hip hinge to 90 deg in seated.    PT Treatment/Interventions  ADLs/Self Care Home Management;Cryotherapy;Electrical Stimulation;Moist Heat;Therapeutic activities;Functional mobility training;Stair training;Gait training;Therapeutic exercise;Balance training;Neuromuscular re-education;Patient/family education;Taping;Manual techniques    PT Next Visit Plan  LE strengthening, R hip ROM, stretching, hamstring stretching, ice, add to exercise plan as appropriate and progess to standing exercises    PT Home Exercise Plan  Access Code: [email or web address]  (ankle pumps, heel slides, LAQ, clam shells with red theraband, SAQ); prone glut set, prone HS curl    Consulted and Agree with Plan of Care  Patient       Patient will benefit from skilled therapeutic intervention in order to improve  the following deficits and impairments:  Pain, Decreased strength, Decreased range of motion, Difficulty walking, Decreased activity tolerance  Visit Diagnosis: Pain in right hip  Muscle weakness (generalized)  Difficulty in walking, not elsewhere classified     Problem  List Patient Active Problem List   Diagnosis Date Noted  . Heterotopic ossification 10/14/2018  . Heterotopic ossification of bone 09/27/2018  . HTN (hypertension) 02/09/2013  . Type II or unspecified type diabetes mellitus with unspecified complication, uncontrolled 02/09/2013  . DKA (diabetic ketoacidoses) ([place]) 02/06/2013  . Acute pancreatitis 02/06/2013  . Menorrhagia 08/13/2011  . Fibroids, submucosal 08/13/2011  . Anemia 08/13/2011    Coben Godshall C. Mouhamed Glassco PT, DPT 11/23/18 12:06 PM   [facility] [place] [place], [place], [address] Phone: [phone]   Fax:  [phone]  Name: Sayani Sulkowski MRN: [id number] Date of Birth: 1965-11-09

## 2018-11-25 ENCOUNTER — Ambulatory Visit: Payer: 59 | Admitting: Physical Therapy

## 2018-11-30 ENCOUNTER — Other Ambulatory Visit: Payer: Self-pay

## 2018-11-30 ENCOUNTER — Ambulatory Visit: Payer: 59 | Admitting: Physical Therapy

## 2018-11-30 ENCOUNTER — Encounter: Payer: Self-pay | Admitting: Physical Therapy

## 2018-11-30 DIAGNOSIS — M25551 Pain in right hip: Secondary | ICD-10-CM | POA: Diagnosis not present

## 2018-11-30 DIAGNOSIS — M6281 Muscle weakness (generalized): Secondary | ICD-10-CM

## 2018-11-30 DIAGNOSIS — R262 Difficulty in walking, not elsewhere classified: Secondary | ICD-10-CM

## 2018-11-30 NOTE — Therapy (Signed)
[facility] [place], [place], [address] Phone: [phone]   Fax:  [phone]  Physical Therapy Treatment  Patient Details  Name: Veronica Fletcher MRN: [id number] Date of Birth: Apr 16, 1965 Referring Provider (PT): Katha Hamming, MD   Encounter Date: 11/30/2018  PT End of Session - 11/30/18 0811    Visit Number  5    Number of Visits  16    Date for PT Re-Evaluation  01/07/19    Authorization Type  UHC    PT Start Time  0805   pt arrived late   PT Stop Time  0844    PT Time Calculation (min)  39 min    Activity Tolerance  Patient tolerated treatment well    Behavior During Therapy  [facility] for tasks assessed/performed       Past Medical History:  Diagnosis Date  . Asthma   . Diabetes mellitus   . Heart murmur   . Hyperlipidemia   . Hypertension   . Pancreatitis   . Pneumonia     Past Surgical History:  Procedure Laterality Date  . ABDOMINAL HYSTERECTOMY    . CESAREAN SECTION    . CHOLECYSTECTOMY    . GANGLION CYST EXCISION    . HIP CAPSULECTOMY Right 10/14/2018   Procedure: RIGHT HIP HETEROTOPIC OSSIFICATION RESECTION;  Surgeon: Shona Needles, MD;  Location: [place];  Service: Orthopedics;  Laterality: Right;  . LAPAROSCOPY      There were no vitals filed for this visit.  Subjective Assessment - 11/30/18 0808    Subjective  It feels like it is loosening up and I have been doing exercises.    Patient Stated Goals  Stop hurting         [facility] PT Assessment - 11/30/18 0001      Assessment   Medical Diagnosis  R heterotopic ossification resection 10/14/2018    Referring Provider (PT)  Katha Hamming, MD    Onset Date/Surgical Date  10/14/18    Hand Dominance  Right    Next MD Visit  Nov 29, 2018    Prior Therapy  for back      Strength   Right Hip Flexion  3/5    Right Hip ABduction  3/5                   [facility] Adult PT Treatment/Exercise - 11/30/18 0001      Knee/Hip Exercises:  Aerobic   Nustep  L3 5 min UE & LE      Knee/Hip Exercises: Standing   Heel Raises  Both;20 reps    Hip Abduction  Right;20 reps    Extension Limitations  toe prop with hip extension    Other Standing Knee Exercises  glut sets      Knee/Hip Exercises: Seated   Other Seated Knee/Hip Exercises  quad set in long sitting- back supported             PT Education - 11/30/18 0829    Education Details  healing process, DDD, strength progress    Person(s) Educated  Patient    Methods  Explanation;Demonstration;Tactile cues;Verbal cues    Comprehension  Verbalized understanding;Returned demonstration;Verbal cues required;Tactile cues required       PT Short Term Goals - 11/19/18 0938      PT SHORT TERM GOAL #1   Title  independent with initial HEP     Baseline  beginning HEP issued at evaluation    Time  4  Status  On-going    Target Date  06/24/18      PT SHORT TERM GOAL #2   Title  Pt will be able to tolerate standing for 20 minutes in order to prepare meals with pain </= 3/10.    Baseline  Pt reporting only able to stand for 5- 10 minutes at a time.    Period  Weeks    Status  On-going    Target Date  06/24/18        PT Long Term Goals - 11/09/18 0842      PT LONG TERM GOAL #1   Title  independent with HEP and understand how to progress herself    Time  8    Period  Weeks    Status  New      PT LONG TERM GOAL #2   Title  Pt able to improve R hip strength to >/= 4+/5 in order to improve funcitonal mobility.    Time  8    Period  Weeks    Status  New    Target Date  01/04/19      PT LONG TERM GOAL #3   Title  Pt will improve her FOTO score from 64% limitation to </= 44% limitation.    Baseline  64% limited on 11/09/2018    Period  Days    Status  New    Target Date  01/04/19      PT LONG TERM GOAL #4   Title  Pt will be able to amb to grocery store with pain </= 3/10 in R hip.    Time  8    Period  Weeks    Status  New    Target Date  01/04/19       PT LONG TERM GOAL #5   Title  Pt will improve R hip flexion in order to pick up bag of groceries off the floor without difficutly.    Baseline  unable to pick up objects off the floor, hip flexion: 45 degrees    Time  8    Period  Weeks    Status  New    Target Date  01/04/19            Plan - 11/30/18 [id number]    Clinical Impression Statement  demo improved strength in proximal hip musculature but continued difficulty obtaining a sustained quad set. added standing hip abd to HEP for abd strength as she is not ready for sidelying at this time.    PT Treatment/Interventions  ADLs/Self Care Home Management;Cryotherapy;Electrical Stimulation;Moist Heat;Therapeutic activities;Functional mobility training;Stair training;Gait training;Therapeutic exercise;Balance training;Neuromuscular re-education;Patient/family education;Taping;Manual techniques    PT Next Visit Plan  review quad sets- try SAQ, continue standing strength    PT Home Exercise Plan  Access Code: [email or web address]  (ankle pumps, heel slides, LAQ, clam shells with red theraband, SAQ); prone/standing glut set, prone HS curl, quad set, standing hip abd    Consulted and Agree with Plan of Care  Patient       Patient will benefit from skilled therapeutic intervention in order to improve the following deficits and impairments:  Pain, Decreased strength, Decreased range of motion, Difficulty walking, Decreased activity tolerance  Visit Diagnosis: Muscle weakness (generalized)  Pain in right hip  Difficulty in walking, not elsewhere classified     Problem List Patient Active Problem List   Diagnosis Date Noted  . Heterotopic ossification 10/14/2018  . Heterotopic ossification of bone 09/27/2018  . HTN (hypertension)  02/09/2013  . Type II or unspecified type diabetes mellitus with unspecified complication, uncontrolled 02/09/2013  . DKA (diabetic ketoacidoses) ([place]) 02/06/2013  . Acute pancreatitis 02/06/2013  . Menorrhagia 08/13/2011   . Fibroids, submucosal 08/13/2011  . Anemia 08/13/2011    Linnell Swords C. Add Dinapoli PT, DPT 11/30/18 8:47 AM   [facility] [address] [place], [place], [address] Phone: [phone]   Fax:  [phone]  Name: Vennessa Chrest MRN: [id number] Date of Birth: [birth year]

## 2018-12-01 NOTE — Progress Notes (Signed)
  Radiation Oncology         [phone] ________________________________  Name: Veronica Fletcher MRN: [id number]  Date: 10/15/2018  DOB: Apr 10, 1965  End of Treatment Note  Diagnosis:   Postoperative heterotopic ossification     Indication for treatment:  curative       Radiation treatment dates:   10/15/18  Site/dose:   The patient was treated to a dose of 7 Gy to the right hip. This was accomplished with AP and PA fields.  Narrative: The patient tolerated radiation treatment relatively well.   No difficulties.  Plan: The patient has completed radiation treatment. The patient will return to radiation oncology clinic on an as needed basis. ________________________________  Jodelle Gross, M.D., Ph.D.

## 2018-12-02 ENCOUNTER — Other Ambulatory Visit: Payer: Self-pay

## 2018-12-02 ENCOUNTER — Encounter: Payer: Self-pay | Admitting: Physical Therapy

## 2018-12-02 ENCOUNTER — Ambulatory Visit: Payer: 59 | Admitting: Physical Therapy

## 2018-12-02 DIAGNOSIS — M25551 Pain in right hip: Secondary | ICD-10-CM

## 2018-12-02 DIAGNOSIS — M6281 Muscle weakness (generalized): Secondary | ICD-10-CM

## 2018-12-02 DIAGNOSIS — R262 Difficulty in walking, not elsewhere classified: Secondary | ICD-10-CM

## 2018-12-02 NOTE — Therapy (Signed)
[facility] [place], [place], [address] Phone: [phone]   Fax:  [phone]  Physical Therapy Treatment  Patient Details  Name: Veronica Fletcher MRN: [id number] Date of Birth: September 13, 1965 Referring Provider (PT): Katha Hamming, MD   Encounter Date: 12/02/2018  PT End of Session - 12/02/18 0723    Visit Number  6    Number of Visits  16    Date for PT Re-Evaluation  01/07/19    Authorization Type  UHC    PT Start Time  0717    PT Stop Time  0805    PT Time Calculation (min)  48 min       Past Medical History:  Diagnosis Date  . Asthma   . Diabetes mellitus   . Heart murmur   . Hyperlipidemia   . Hypertension   . Pancreatitis   . Pneumonia     Past Surgical History:  Procedure Laterality Date  . ABDOMINAL HYSTERECTOMY    . CESAREAN SECTION    . CHOLECYSTECTOMY    . GANGLION CYST EXCISION    . HIP CAPSULECTOMY Right 10/14/2018   Procedure: RIGHT HIP HETEROTOPIC OSSIFICATION RESECTION;  Surgeon: Shona Needles, MD;  Location: [place];  Service: Orthopedics;  Laterality: Right;  . LAPAROSCOPY      There were no vitals filed for this visit.  Subjective Assessment - 12/02/18 0721    Subjective  I am doing my exercises, no pain no gain.    Currently in Pain?  Yes    Pain Score  6     Pain Location  Hip    Pain Orientation  Right    Pain Descriptors / Indicators  Aching    Pain Type  Surgical pain    Aggravating Factors   walking > 10 minutes    Pain Relieving Factors  rest         [facility] PT Assessment - 12/02/18 0001      AROM   Right Hip Flexion  55   with heel slide                  [facility] Adult PT Treatment/Exercise - 12/02/18 0001      Knee/Hip Exercises: Stretches   Other Knee/Hip Stretches  slant board       Knee/Hip Exercises: Aerobic   Nustep  L3 5 min UE & LE      Knee/Hip Exercises: Standing   Heel Raises  Both;20 reps    Hip Flexion Limitations  facing counter  low march as tolerated     Hip Abduction  Right;20 reps    Hip Extension  Right;20 reps    Other Standing Knee Exercises  lateral weight shift- wider stance for posture    Other Standing Knee Exercises  glut sets      Knee/Hip Exercises: Supine   Quad Sets  10 reps    Quad Sets Limitations  into towel     Heel Slides  Strengthening;Right;2 sets;10 reps    Heel Slides Limitations  with and without strap     Other Supine Knee/Hip Exercises  clam shells with red theraband      Modalities   Modalities  Moist Heat      Moist Heat Therapy   Number Minutes Moist Heat  10 Minutes   pt reports MD okayed heat    Moist Heat Location  Hip      Manual Therapy   Soft tissue mobilization  roller-  focus in VL and ITB               PT Short Term Goals - 12/02/18 0735      PT SHORT TERM GOAL #1   Title  independent with initial HEP     Baseline  beginning HEP issued at evaluation    Time  4    Period  Weeks    Status  On-going      PT SHORT TERM GOAL #2   Title  Pt will be able to tolerate standing for 20 minutes in order to prepare meals with pain </= 3/10.    Baseline  tolerating 20 minutes standing for cooking    Time  4    Period  Weeks    Status  Achieved        PT Long Term Goals - 11/09/18 3785      PT LONG TERM GOAL #1   Title  independent with HEP and understand how to progress herself    Time  8    Period  Weeks    Status  New      PT LONG TERM GOAL #2   Title  Pt able to improve R hip strength to >/= 4+/5 in order to improve funcitonal mobility.    Time  8    Period  Weeks    Status  New    Target Date  01/04/19      PT LONG TERM GOAL #3   Title  Pt will improve her FOTO score from 64% limitation to </= 44% limitation.    Baseline  64% limited on 11/09/2018    Period  Days    Status  New    Target Date  01/04/19      PT LONG TERM GOAL #4   Title  Pt will be able to amb to grocery store with pain </= 3/10 in R hip.    Time  8    Period  Weeks     Status  New    Target Date  01/04/19      PT LONG TERM GOAL #5   Title  Pt will improve R hip flexion in order to pick up bag of groceries off the floor without difficutly.    Baseline  unable to pick up objects off the floor, hip flexion: 45 degrees    Time  8    Period  Weeks    Status  New    Target Date  01/04/19            Plan - 12/02/18 0724    Clinical Impression Statement  Pt reports slow improvement in pain and function. She notes improved tolerance in standng for cooking to about 20 minutes prior to needing rest. STG#2 met.  Pt tolerated standing therex well, still painful. In supine used strap assist for heel slides which improved her ROM however increased her pain. HMP applied at end of session.    PT Next Visit Plan  review quad sets- try SAQ, continue standing strength    PT Home Exercise Plan  Access Code: [email or web address]  (ankle pumps, heel slides, LAQ, clam shells with red theraband, SAQ); prone/standing glut set, prone HS curl, quad set, standing hip abd       Patient will benefit from skilled therapeutic intervention in order to improve the following deficits and impairments:  Pain, Decreased strength, Decreased range of motion, Difficulty walking, Decreased activity tolerance  Visit Diagnosis: No diagnosis found.  Problem List Patient Active Problem List   Diagnosis Date Noted  . Heterotopic ossification 10/14/2018  . Heterotopic ossification of bone 09/27/2018  . HTN (hypertension) 02/09/2013  . Type II or unspecified type diabetes mellitus with unspecified complication, uncontrolled 02/09/2013  . DKA (diabetic ketoacidoses) ([place]) 02/06/2013  . Acute pancreatitis 02/06/2013  . Menorrhagia 08/13/2011  . Fibroids, submucosal 08/13/2011  . Anemia 08/13/2011    Dorene Ar, PTA 12/02/2018, 9:31 AM  [facility] [address] [place], [place], [address] Phone: [phone]   Fax:   [phone]  Name: Veronica Fletcher MRN: [id number] Date of Birth: 1965/01/26

## 2018-12-07 ENCOUNTER — Other Ambulatory Visit: Payer: Self-pay

## 2018-12-07 ENCOUNTER — Encounter: Payer: Self-pay | Admitting: Physical Therapy

## 2018-12-07 ENCOUNTER — Ambulatory Visit: Payer: 59 | Admitting: Physical Therapy

## 2018-12-07 DIAGNOSIS — M6281 Muscle weakness (generalized): Secondary | ICD-10-CM

## 2018-12-07 DIAGNOSIS — M25551 Pain in right hip: Secondary | ICD-10-CM | POA: Diagnosis not present

## 2018-12-07 DIAGNOSIS — R262 Difficulty in walking, not elsewhere classified: Secondary | ICD-10-CM

## 2018-12-07 NOTE — Therapy (Signed)
[place], [place], [address] Phone: [phone]   Fax:  [phone]  Physical Therapy Treatment  Patient Details  Name: Veronica Fletcher MRN: [id number] Date of Birth: [birth year] Referring Provider (PT): Katha Hamming, MD   Encounter Date: 12/07/2018  PT End of Session - 12/07/18 0721    Visit Number  7    Number of Visits  16    Date for PT Re-Evaluation  01/07/19    Authorization Type  UHC    PT Start Time  [id number]    PT Stop Time  0807    PT Time Calculation (min)  50 min       Past Medical History:  Diagnosis Date  . Asthma   . Diabetes mellitus   . Heart murmur   . Hyperlipidemia   . Hypertension   . Pancreatitis   . Pneumonia     Past Surgical History:  Procedure Laterality Date  . ABDOMINAL HYSTERECTOMY    . CESAREAN SECTION    . CHOLECYSTECTOMY    . GANGLION CYST EXCISION    . HIP CAPSULECTOMY Right 10/14/2018   Procedure: RIGHT HIP HETEROTOPIC OSSIFICATION RESECTION;  Surgeon: Shona Needles, MD;  Location: [facility];  Service: Orthopedics;  Laterality: Right;  . LAPAROSCOPY      There were no vitals filed for this visit.  Subjective Assessment - 12/07/18 0720    Subjective  I did a little more moving around yesterday and I am in a little more pain this morning.    Currently in Pain?  Yes    Pain Score  7     Pain Location  Hip    Pain Orientation  Right    Pain Descriptors / Indicators  Aching    Pain Type  Surgical pain    Aggravating Factors   walking, activity >10 minutes    Pain Relieving Factors  rest         [facility] PT Assessment - 12/07/18 0001      AROM   Right Hip Flexion  70   AAROM with strap                   [facility] Adult PT Treatment/Exercise - 12/07/18 0001      Knee/Hip Exercises: Stretches   Hip Flexor Stretch Limitations  standing lunge at counter 2 x 20 sec bilateral     Other Knee/Hip Stretches  seated EOM, physioball roll outs for hip  flexion x10    Other Knee/Hip Stretches  slant board       Knee/Hip Exercises: Aerobic   Nustep  L5 5 min UE & LE      Knee/Hip Exercises: Standing   Heel Raises  Both;20 reps    Hip Flexion  10 reps    Hip Flexion Limitations  then facing counter isometric into towel 5 sec x 10      Abduction Limitations  side stepping Right and left facing counter top 12 ft x 2 each way     Hip Extension  Right;20 reps    Extension Limitations  toe prop with hip extension    Other Standing Knee Exercises  lateral weight shift- wider stance for posture    Other Standing Knee Exercises  forward and backward weight shifting for involuntary quad contract      Knee/Hip Exercises: Seated   Other Seated Knee/Hip Exercises  review of quad set foot on floor and long sitting single leg  Sit to Sand  10 reps   cues for hip hinge, needs UE      Knee/Hip Exercises: Supine   Quad Sets  10 reps      Moist Heat Therapy   Number Minutes Moist Heat  10 Minutes    Moist Heat Location  Hip               PT Short Term Goals - 12/02/18 0735      PT SHORT TERM GOAL #1   Title  independent with initial HEP     Baseline  beginning HEP issued at evaluation    Time  4    Period  Weeks    Status  On-going      PT SHORT TERM GOAL #2   Title  Pt will be able to tolerate standing for 20 minutes in order to prepare meals with pain </= 3/10.    Baseline  tolerating 20 minutes standing for cooking    Time  4    Period  Weeks    Status  Achieved        PT Long Term Goals - 11/09/18 [id number]      PT LONG TERM GOAL #1   Title  independent with HEP and understand how to progress herself    Time  8    Period  Weeks    Status  New      PT LONG TERM GOAL #2   Title  Pt able to improve R hip strength to >/= 4+/5 in order to improve funcitonal mobility.    Time  8    Period  Weeks    Status  New    Target Date  01/04/19      PT LONG TERM GOAL #3   Title  Pt will improve her FOTO score from 64%  limitation to </= 44% limitation.    Baseline  64% limited on 11/09/2018    Period  Days    Status  New    Target Date  01/04/19      PT LONG TERM GOAL #4   Title  Pt will be able to amb to grocery store with pain </= 3/10 in R hip.    Time  8    Period  Weeks    Status  New    Target Date  01/04/19      PT LONG TERM GOAL #5   Title  Pt will improve R hip flexion in order to pick up bag of groceries off the floor without difficutly.    Baseline  unable to pick up objects off the floor, hip flexion: 45 degrees    Time  8    Period  Weeks    Status  New    Target Date  01/04/19            Plan - 12/07/18 0809    Clinical Impression Statement  pt arrives with increased pain level she attributes to increased activity around the home. Continued with quad and hip flexor activation and stretching. Began hip hinging for sit-stands and use of physioball for forward bending stretch. She required cues to correctly contract quad. [place] again post session. Hip flexion AROM/AAROM continues to improve.    PT Next Visit Plan  review quad sets- try SAQ, continue standing strength, continue sit-stand/hip hinge, FOTO    PT Home Exercise Plan  Access Code: [email or web address]  (ankle pumps, heel slides, LAQ, clam shells with red theraband, SAQ); prone/standing glut  set, prone HS curl, quad set, standing hip abd       Patient will benefit from skilled therapeutic intervention in order to improve the following deficits and impairments:  Pain, Decreased strength, Decreased range of motion, Difficulty walking, Decreased activity tolerance  Visit Diagnosis: Muscle weakness (generalized)  Pain in right hip  Difficulty in walking, not elsewhere classified     Problem List Patient Active Problem List   Diagnosis Date Noted  . Heterotopic ossification 10/14/2018  . Heterotopic ossification of bone 09/27/2018  . HTN (hypertension) 02/09/2013  . Type II or unspecified type diabetes mellitus with unspecified  complication, uncontrolled 02/09/2013  . DKA (diabetic ketoacidoses) ([facility]) 02/06/2013  . Acute pancreatitis 02/06/2013  . Menorrhagia 08/13/2011  . Fibroids, submucosal 08/13/2011  . Anemia 08/13/2011    Dorene Ar, PTA 12/07/2018, 8:23 AM  [facility] [address] [place], [place], [address] Phone: [phone]   Fax:  [phone]  Name: Veronica Fletcher MRN: [id number] Date of Birth: Jun 12, 1965

## 2018-12-14 ENCOUNTER — Ambulatory Visit: Payer: 59 | Attending: Student | Admitting: Physical Therapy

## 2018-12-14 DIAGNOSIS — M25551 Pain in right hip: Secondary | ICD-10-CM | POA: Insufficient documentation

## 2018-12-14 DIAGNOSIS — M6281 Muscle weakness (generalized): Secondary | ICD-10-CM | POA: Insufficient documentation

## 2018-12-14 DIAGNOSIS — R262 Difficulty in walking, not elsewhere classified: Secondary | ICD-10-CM | POA: Insufficient documentation

## 2018-12-15 ENCOUNTER — Telehealth: Payer: Self-pay | Admitting: Physical Therapy

## 2018-12-15 NOTE — Telephone Encounter (Signed)
Left voicemail regarding missed PT visit. Left reminder for patient to call if she needs to cancel or reschedule her future appointments.

## 2018-12-16 ENCOUNTER — Ambulatory Visit: Payer: 59 | Admitting: Physical Therapy

## 2018-12-20 ENCOUNTER — Other Ambulatory Visit: Payer: Self-pay

## 2018-12-20 ENCOUNTER — Encounter: Payer: Self-pay | Admitting: Physical Therapy

## 2018-12-20 ENCOUNTER — Ambulatory Visit: Payer: 59 | Admitting: Physical Therapy

## 2018-12-20 DIAGNOSIS — R262 Difficulty in walking, not elsewhere classified: Secondary | ICD-10-CM | POA: Diagnosis present

## 2018-12-20 DIAGNOSIS — M25551 Pain in right hip: Secondary | ICD-10-CM

## 2018-12-20 DIAGNOSIS — M6281 Muscle weakness (generalized): Secondary | ICD-10-CM | POA: Diagnosis not present

## 2018-12-20 NOTE — Therapy (Signed)
[place], [place], [address] Phone: [phone]   Fax:  [phone]  Physical Therapy Treatment  Patient Details  Name: Veronica Fletcher MRN: [id number] Date of Birth: September 04, 1965 Referring Provider (PT): Katha Hamming, MD   Encounter Date: 12/20/2018  PT End of Session - 12/20/18 0806    Visit Number  8    Number of Visits  16    Date for PT Re-Evaluation  01/07/19    Authorization Type  UHC    PT Start Time  0801    PT Stop Time  0852    PT Time Calculation (min)  51 min       Past Medical History:  Diagnosis Date  . Asthma   . Diabetes mellitus   . Heart murmur   . Hyperlipidemia   . Hypertension   . Pancreatitis   . Pneumonia     Past Surgical History:  Procedure Laterality Date  . ABDOMINAL HYSTERECTOMY    . CESAREAN SECTION    . CHOLECYSTECTOMY    . GANGLION CYST EXCISION    . HIP CAPSULECTOMY Right 10/14/2018   Procedure: RIGHT HIP HETEROTOPIC OSSIFICATION RESECTION;  Surgeon: Shona Needles, MD;  Location: [facility];  Service: Orthopedics;  Laterality: Right;  . LAPAROSCOPY      There were no vitals filed for this visit.  Subjective Assessment - 12/20/18 0802    Subjective  I was out last week with my back. My hip still hurts too.    Currently in Pain?  Yes    Pain Score  5     Pain Location  Hip    Pain Orientation  Right    Pain Descriptors / Indicators  Aching    Pain Type  Surgical pain    Aggravating Factors   prolonged activity on feet    Pain Relieving Factors  rest         [facility] PT Assessment - 12/20/18 0001      Observation/Other Assessments   Focus on Therapeutic Outcomes (FOTO)   56% improved from 64% limited      AROM   Right Hip Flexion  75   AAROM 80 with towel                  [facility] Adult PT Treatment/Exercise - 12/20/18 0001      Knee/Hip Exercises: Stretches   Hip Flexor Stretch Limitations  Hang off edge of mat, can come to neutral only      Other Knee/Hip Stretches  seated EOM, physioball roll outs for hip flexion x10    Other Knee/Hip Stretches  slant board       Knee/Hip Exercises: Aerobic   Nustep  L5 5 min UE & LE      Knee/Hip Exercises: Standing   Heel Raises  20 reps    Hip Flexion  10 reps    Hip Flexion Limitations  then facing counter isometric into towel 5 sec x 10      Abduction Limitations  side stepping Right and left facing counter top 12 ft x 2 each way     Hip Extension  Right;20 reps    Extension Limitations  toe prop with hip extension    Other Standing Knee Exercises  forward and backward weight shifting for involuntary quad contract      Knee/Hip Exercises: Seated   Other Seated Knee/Hip Exercises  review of quad set foot on floor and long sitting single leg  Sit to [employer]  10 reps   cues for eccentic lower , needs UE to rise     Knee/Hip Exercises: Supine   Quad Sets  10 reps    Quad Sets Limitations  improved contraction, tactile cues     Heel Slides  5 reps    Bridges Limitations  gluteal sets     Other Supine Knee/Hip Exercises  --    Other Supine Knee/Hip Exercises  assited knee to chest with towel x 10 to approx 80 degrees       Moist Heat Therapy   Number Minutes Moist Heat  10 Minutes    Moist Heat Location  Hip      Manual Therapy   Soft tissue mobilization  manual soft tissue to right thigh distal to proximal while in hpi flexor stretch                PT Short Term Goals - 12/02/18 0735      PT SHORT TERM GOAL #1   Title  independent with initial HEP     Baseline  beginning HEP issued at evaluation    Time  4    Period  Weeks    Status  On-going      PT SHORT TERM GOAL #2   Title  Pt will be able to tolerate standing for 20 minutes in order to prepare meals with pain </= 3/10.    Baseline  tolerating 20 minutes standing for cooking    Time  4    Period  Weeks    Status  Achieved        PT Long Term Goals - 12/20/18 0947      PT LONG TERM GOAL #1    Title  independent with HEP and understand how to progress herself    Time  8    Period  Weeks    Status  On-going      PT LONG TERM GOAL #2   Title  Pt able to improve R hip strength to >/= 4+/5 in order to improve funcitonal mobility.    Time  8    Period  Weeks    Status  On-going      PT LONG TERM GOAL #3   Title  Pt will improve her FOTO score from 64% limitation to </= 44% limitation.    Baseline  improving    Time  8    Period  Days    Status  On-going      PT LONG TERM GOAL #4   Title  Pt will be able to amb to grocery store with pain </= 3/10 in R hip.    Time  8    Period  Weeks    Status  Unable to assess      PT LONG TERM GOAL #5   Title  Pt will improve R hip flexion in order to pick up bag of groceries off the floor without difficutly.    Time  8    Period  Weeks    Status  Unable to assess            Plan - 12/20/18 0804    Clinical Impression Statement  Pt arrives after missing two appointments last week due to new onset of back pain. Her back pain is getting better. She reports she was able to cook a full meal yesterday with 2 rest breaks. She notes improved tolerance to standing tasks. Her hip flexion AROM/AAROM  has improved. She is still unable to perform SLR due to weakness. She does note that she is able to lift RLE more without as much UE assist. Progressing toward LTGs.    PT Next Visit Plan  review quad sets- try SAQ, continue standing strength, continue sit-stand/hip hinge    PT Home Exercise Plan  Access Code: [email or web address]  (ankle pumps, heel slides, LAQ, clam shells with red theraband, SAQ); prone/standing glut set, prone HS curl, quad set, standing hip abd       Patient will benefit from skilled therapeutic intervention in order to improve the following deficits and impairments:  Pain, Decreased strength, Decreased range of motion, Difficulty walking, Decreased activity tolerance  Visit Diagnosis: Muscle weakness (generalized)  Difficulty in  walking, not elsewhere classified  Pain in right hip     Problem List Patient Active Problem List   Diagnosis Date Noted  . Heterotopic ossification 10/14/2018  . Heterotopic ossification of bone 09/27/2018  . HTN (hypertension) 02/09/2013  . Type II or unspecified type diabetes mellitus with unspecified complication, uncontrolled 02/09/2013  . DKA (diabetic ketoacidoses) ([facility]) 02/06/2013  . Acute pancreatitis 02/06/2013  . Menorrhagia 08/13/2011  . Fibroids, submucosal 08/13/2011  . Anemia 08/13/2011    Dorene Ar, PTA 12/20/2018, 9:47 AM  [facility] [address] [place], [place], [address] Phone: [phone]   Fax:  [phone]  Name: Veronica Fletcher MRN: [id number] Date of Birth: 09-Jul-1965

## 2018-12-21 ENCOUNTER — Other Ambulatory Visit: Payer: 59

## 2018-12-21 ENCOUNTER — Other Ambulatory Visit (INDEPENDENT_AMBULATORY_CARE_PROVIDER_SITE_OTHER): Payer: 59

## 2018-12-21 DIAGNOSIS — E1165 Type 2 diabetes mellitus with hyperglycemia: Secondary | ICD-10-CM

## 2018-12-21 DIAGNOSIS — Z794 Long term (current) use of insulin: Secondary | ICD-10-CM | POA: Diagnosis not present

## 2018-12-21 LAB — LIPID PANEL
Cholesterol: 197 mg/dL (ref 0–200)
HDL: 49.6 mg/dL (ref >39.00)
LDL Cholesterol: 127 mg/dL — ABNORMAL HIGH (ref 0–99)
NonHDL: 147.17
Total CHOL/HDL Ratio: 4
Triglycerides: 103 mg/dL (ref 0.0–149.0)
VLDL: 20.6 mg/dL (ref 0.0–40.0)

## 2018-12-21 LAB — COMPREHENSIVE METABOLIC PANEL
ALT: 13 U/L (ref 0–35)
AST: 16 U/L (ref 0–37)
Albumin: 4.3 g/dL (ref 3.5–5.2)
Alkaline Phosphatase: 82 U/L (ref 39–117)
BUN: 12 mg/dL (ref 6–23)
CO2: 26 mEq/L (ref 19–32)
Calcium: 9.3 mg/dL (ref 8.4–10.5)
Chloride: 105 mEq/L (ref 96–112)
Creatinine, Ser: 0.66 mg/dL (ref 0.40–1.20)
GFR: 113.22 mL/min (ref >60.00)
Glucose, Bld: 125 mg/dL — ABNORMAL HIGH (ref 70–99)
Potassium: 3.8 mEq/L (ref 3.5–5.1)
Sodium: 138 mEq/L (ref 135–145)
Total Bilirubin: 0.6 mg/dL (ref 0.2–1.2)
Total Protein: 7.2 g/dL (ref 6.0–8.3)

## 2018-12-21 LAB — HEMOGLOBIN A1C: Hgb A1c MFr Bld: 6.3 % (ref 4.6–6.5)

## 2018-12-23 ENCOUNTER — Other Ambulatory Visit: Payer: Self-pay

## 2018-12-23 ENCOUNTER — Encounter: Payer: Self-pay | Admitting: Physical Therapy

## 2018-12-23 ENCOUNTER — Ambulatory Visit: Payer: 59 | Admitting: Physical Therapy

## 2018-12-23 DIAGNOSIS — M25551 Pain in right hip: Secondary | ICD-10-CM

## 2018-12-23 DIAGNOSIS — M6281 Muscle weakness (generalized): Secondary | ICD-10-CM | POA: Diagnosis not present

## 2018-12-23 DIAGNOSIS — R262 Difficulty in walking, not elsewhere classified: Secondary | ICD-10-CM

## 2018-12-23 NOTE — Therapy (Signed)
[place], [place], [address] Phone: [phone]   Fax:  [phone]  Physical Therapy Treatment  Patient Details  Name: Veronica Fletcher MRN: [id number] Date of Birth: [birth year] Referring Provider (PT): Katha Hamming, MD   Encounter Date: 12/23/2018  PT End of Session - 12/23/18 0721    Visit Number  9    Number of Visits  16    Date for PT Re-Evaluation  01/07/19    Authorization Type  UHC    PT Start Time  0717    PT Stop Time  0815    PT Time Calculation (min)  58 min       Past Medical History:  Diagnosis Date  . Asthma   . Diabetes mellitus   . Heart murmur   . Hyperlipidemia   . Hypertension   . Pancreatitis   . Pneumonia     Past Surgical History:  Procedure Laterality Date  . ABDOMINAL HYSTERECTOMY    . CESAREAN SECTION    . CHOLECYSTECTOMY    . GANGLION CYST EXCISION    . HIP CAPSULECTOMY Right 10/14/2018   Procedure: RIGHT HIP HETEROTOPIC OSSIFICATION RESECTION;  Surgeon: Shona Needles, MD;  Location: [facility];  Service: Orthopedics;  Laterality: Right;  . LAPAROSCOPY      There were no vitals filed for this visit.  Subjective Assessment - 12/23/18 0721    Subjective  Back pain is worst than hip this morning. 8/10 Back, 5/10 right hip pain.    Currently in Pain?  Yes    Pain Score  5     Pain Location  Hip    Pain Orientation  Right    Pain Descriptors / Indicators  Aching         [facility] PT Assessment - 12/23/18 0001      AROM   Right Hip Flexion  --   AAROM 93 with towel                   [facility] Adult PT Treatment/Exercise - 12/23/18 0001      Lumbar Exercises: Stretches   Single Knee to Chest Stretch  3 reps;30 seconds    Single Knee to Chest Stretch Limitations  needs towel assist on right     Lower Trunk Rotation  10 seconds    Lower Trunk Rotation Limitations  10 reps , cues for comfortable ROM    Pelvic Tilt  10 reps    Pelvic Tilt Limitations   cues for gluteal squeeeze and abdominal draw in     Piriformis Stretch  Left;30 seconds    Piriformis Stretch Limitations  push and pull      Knee/Hip Exercises: Stretches   Hip Flexor Stretch Limitations  Hang off edge of mat, can come to neutral only     Other Knee/Hip Stretches  seated EOM, physioball roll outs for hip flexion x10    Other Knee/Hip Stretches  slant board       Knee/Hip Exercises: Aerobic   Nustep  L5 5 min UE & LE      Knee/Hip Exercises: Standing   Hip Flexion Limitations  marching       Knee/Hip Exercises: Seated   Sit to Sand  10 reps   no UE needed today     Knee/Hip Exercises: Supine   Bridges  10 reps      Moist Heat Therapy   Number Minutes Moist Heat  15 Minutes  Moist Heat Location  Hip   and lumbar            PT Education - 12/23/18 0804    Education Details  HEP    Person(s) Educated  Patient    Methods  Explanation;Handout    Comprehension  Verbalized understanding       PT Short Term Goals - 12/02/18 0735      PT SHORT TERM GOAL #1   Title  independent with initial HEP     Baseline  beginning HEP issued at evaluation    Time  4    Period  Weeks    Status  On-going      PT SHORT TERM GOAL #2   Title  Pt will be able to tolerate standing for 20 minutes in order to prepare meals with pain </= 3/10.    Baseline  tolerating 20 minutes standing for cooking    Time  4    Period  Weeks    Status  Achieved        PT Long Term Goals - 12/20/18 0947      PT LONG TERM GOAL #1   Title  independent with HEP and understand how to progress herself    Time  8    Period  Weeks    Status  On-going      PT LONG TERM GOAL #2   Title  Pt able to improve R hip strength to >/= 4+/5 in order to improve funcitonal mobility.    Time  8    Period  Weeks    Status  On-going      PT LONG TERM GOAL #3   Title  Pt will improve her FOTO score from 64% limitation to </= 44% limitation.    Baseline  improving    Time  8    Period  Days     Status  On-going      PT LONG TERM GOAL #4   Title  Pt will be able to amb to grocery store with pain </= 3/10 in R hip.    Time  8    Period  Weeks    Status  Unable to assess      PT LONG TERM GOAL #5   Title  Pt will improve R hip flexion in order to pick up bag of groceries off the floor without difficutly.    Time  8    Period  Weeks    Status  Unable to assess            Plan - 12/23/18 0804    Clinical Impression Statement  Pt arrives with increased back pain. Session focused on hip and lumbar stretches to calm down pain. She was suprprised by how much better she felt after therex and requested additional HEP. Instructed her to try these in the morning when she is having the most difficulty. HMP as end of session.    PT Next Visit Plan  review quad sets- try SAQ, continue standing strength, continue sit-stand/hip hinge, continue bilateral stretching to reduce back pain.    PT Home Exercise Plan  Access Code: [email or web address]  (ankle pumps, heel slides, LAQ, clam shells with red theraband, SAQ); prone/standing glut set, prone HS curl, quad set, standing hip abd, 12/23/18 added pirifomris strtech, knee to chest, LTR, pelvic tilit.s    Consulted and Agree with Plan of Care  Patient       Patient will benefit from skilled therapeutic  intervention in order to improve the following deficits and impairments:  Pain, Decreased strength, Decreased range of motion, Difficulty walking, Decreased activity tolerance  Visit Diagnosis: Muscle weakness (generalized)  Difficulty in walking, not elsewhere classified  Pain in right hip     Problem List Patient Active Problem List   Diagnosis Date Noted  . Heterotopic ossification 10/14/2018  . Heterotopic ossification of bone 09/27/2018  . HTN (hypertension) 02/09/2013  . Type II or unspecified type diabetes mellitus with unspecified complication, uncontrolled 02/09/2013  . DKA (diabetic ketoacidoses) ([facility]) 02/06/2013  . Acute  pancreatitis 02/06/2013  . Menorrhagia 08/13/2011  . Fibroids, submucosal 08/13/2011  . Anemia 08/13/2011    Dorene Ar, PTA 12/23/2018, 8:24 AM  [facility] [address] [place], [place], [address] Phone: [phone]   Fax:  [phone]  Name: Veronica Fletcher MRN: [id number] Date of Birth: 1965/06/26

## 2018-12-24 ENCOUNTER — Encounter: Payer: Self-pay | Admitting: Endocrinology

## 2018-12-24 ENCOUNTER — Ambulatory Visit (INDEPENDENT_AMBULATORY_CARE_PROVIDER_SITE_OTHER): Payer: 59 | Admitting: Endocrinology

## 2018-12-24 VITALS — BP 130/70 | HR 98 | Ht 62.0 in | Wt 191.4 lb

## 2018-12-24 DIAGNOSIS — Z794 Long term (current) use of insulin: Secondary | ICD-10-CM | POA: Diagnosis not present

## 2018-12-24 DIAGNOSIS — E1165 Type 2 diabetes mellitus with hyperglycemia: Secondary | ICD-10-CM | POA: Diagnosis not present

## 2018-12-24 DIAGNOSIS — E7800 Pure hypercholesterolemia, unspecified: Secondary | ICD-10-CM | POA: Diagnosis not present

## 2018-12-24 MED ORDER — ATORVASTATIN CALCIUM 10 MG PO TABS: 10.0000 mg | ORAL_TABLET | Freq: Every day | ORAL | 1 refills | Status: DC

## 2018-12-24 NOTE — Patient Instructions (Signed)
Check blood sugars on waking up days a week  Also check blood sugars about 2 hours after meals and do this after different meals by rotation  Recommended blood sugar levels on waking up are 90-130 and about 2 hours after meal is 130-160  Please bring your blood sugar monitor to each visit, thank you   

## 2018-12-24 NOTE — Progress Notes (Signed)
Patient ID: Veronica Fletcher, female   DOB: 09/13/1965, 53 y.o.   MRN: [id number]           Reason for Appointment: Follow-up for Type 2 Diabetes  Referring physician: Harrington Challenger  History of Present Illness:          Date of diagnosis of type 2 diabetes mellitus: 2005       Background history:  She had been started on metformin initially and not clear what other treatment she had taken subsequently, records are being awaited today She apparently was doing very well with a combination of Victoza and metformin until 2015 and she thinks she was losing weight with this.  She had taken Victoza for several months at least However according to his lab records her best A1c in 2014 was only 7.9 Apparently she was taken off Victoza when she was admitted for abdominal pain presumed to be from pancreatitis in 01/2013 She was then started on premixed insulin and taken off metformin also on discharge. She was referred here for poorly controlled diabetes with A1c consistently around 10% On her initial consultation she was given metformin and Jardiance in addition to her insulin  V-go pump 20 unit basal and boluses 4 units previously stopped  Recent history:   INSULIN regimen is: Novolog Mix at meals: 18a.m.-16 before dinner   Non-insulin hypoglycemic drugs: Farxiga 10 mg daily, metformin ER 1000 mg daily Ozempic 1.0 mg weekly  Her A1c is significantly better at 6.3, was 7.1 Current management, blood sugar patterns and problems identified:  She has used her Accu-Chek meter although her insurance company wants to switch her to their own brand  She is checking blood sugars somewhat irregularly and only about once a day on an average  However she seems to be still recovering from surgery and still having pain  She said that her eating times and portions are variable and occasionally may miss a meal  Occasionally has had a feeling of low blood sugar but not in the last week  No documented  hypoglycemia  Also occasionally may have a high reading of 190 with her irregular routine  Still not able to do any walking and is in physical therapy  Despite her eating smaller portions her weight has not come down  Still compliant with her on her noninsulin diabetes drugs also  Renal function stable on Farxiga   Side effects from medications have been:?  Victoza caused pancreatitis  Compliance with the medical regimen: Good   Glucose monitoring:  done 0.5 times a day         Glucometer:  Verio   Blood Glucose readings by meter download   PRE-MEAL  morning Lunch Dinner Bedtime Overall  Glucose range:  100-190  139  94-147    Mean/median:  139     125   POST-MEAL PC Breakfast PC Lunch PC Dinner  Glucose range:    75-139  Mean/median:        :Self-care: The diet that the patient has been following is: tries to limit portions.      Typical meal intake: Breakfast is oatmeal/yogurt, lunch is a grilled chicken, evening meal is meat and 2 vegetables, will have snacks on yogurt    Dinner at 6 pm           Dietician visit, most recent:2014                Weight history:   Wt Readings from Last 3 Encounters:  12/24/18  191 lb 6.4 oz (86.8 kg)  10/14/18 192 lb (87.1 kg)  10/12/18 192 lb 3.2 oz (87.2 kg)    Glycemic control:   Lab Results  Component Value Date   HGBA1C 6.3 12/21/2018   HGBA1C 7.1 (H) 09/29/2018   HGBA1C 7.7 (H) 06/28/2018   Lab Results  Component Value Date   MICROALBUR <0.7 09/29/2018   LDLCALC 127 (H) 12/21/2018   CREATININE 0.66 12/21/2018   Lab Results  Component Value Date   MICRALBCREAT 0.9 09/29/2018    Lab Results  Component Value Date   FRUCTOSAMINE 293 (H) 03/22/2018   FRUCTOSAMINE 250 08/12/2017   FRUCTOSAMINE 290 (H) 06/16/2017   FRUCTOSAMINE 284 10/22/2016     Lab on 12/21/2018  Component Date Value Ref Range Status  . Cholesterol 12/21/2018 197  0 - 200 mg/dL Final   ATP III Classification       Desirable:  < 200 mg/dL                Borderline High:  200 - 239 mg/dL          High:  > = 240 mg/dL  . Triglycerides 12/21/2018 103.0  0.0 - 149.0 mg/dL Final   Normal:  <150 mg/dLBorderline High:  150 - 199 mg/dL  . HDL 12/21/2018 49.60  >39.00 mg/dL Final  . VLDL 12/21/2018 20.6  0.0 - 40.0 mg/dL Final  . LDL Cholesterol 12/21/2018 127* 0 - 99 mg/dL Final  . Total CHOL/HDL Ratio 12/21/2018 4   Final                  Men          Women1/2 Average Risk     3.4          3.3Average Risk          5.0          4.42X Average Risk          9.6          7.13X Average Risk          15.0          11.0                      . NonHDL 12/21/2018 147.17   Final   NOTE:  Non-HDL goal should be 30 mg/dL higher than patient's LDL goal (i.e. LDL goal of < 70 mg/dL, would have non-HDL goal of < 100 mg/dL)  . Sodium 12/21/2018 138  135 - 145 mEq/L Final  . Potassium 12/21/2018 3.8  3.5 - 5.1 mEq/L Final  . Chloride 12/21/2018 105  96 - 112 mEq/L Final  . CO2 12/21/2018 26  19 - 32 mEq/L Final  . Glucose, Bld 12/21/2018 125* 70 - 99 mg/dL Final  . BUN 12/21/2018 12  6 - 23 mg/dL Final  . Creatinine, Ser 12/21/2018 0.66  0.40 - 1.20 mg/dL Final  . Total Bilirubin 12/21/2018 0.6  0.2 - 1.2 mg/dL Final  . Alkaline Phosphatase 12/21/2018 82  39 - 117 U/L Final  . AST 12/21/2018 16  0 - 37 U/L Final  . ALT 12/21/2018 13  0 - 35 U/L Final  . Total Protein 12/21/2018 7.2  6.0 - 8.3 g/dL Final  . Albumin 12/21/2018 4.3  3.5 - 5.2 g/dL Final  . GFR 12/21/2018 113.22  >60.00 mL/min Final  . Calcium 12/21/2018 9.3  8.4 - 10.5 mg/dL Final  . Hgb A1c MFr  Bld 12/21/2018 6.3  4.6 - 6.5 % Final   Glycemic Control Guidelines for People with Diabetes:Non Diabetic:  <6%Goal of Therapy: <7%Additional Action Suggested:  >8%       Allergies as of 12/24/2018      Reactions   Liraglutide Other (See Comments)   Other reaction(s): Pancreatitis *Victoza       Medication List       Accurate as of December 24, 2018 10:31 AM. If you have any  questions, ask your nurse or doctor.        Accu-Chek FastClix Lancets Misc 1 each by Does not apply route 2 (two) times a day. Use Accu Chek Fastclix lancets to check blood sugar twice daily.   Accu-Chek Guide test strip Generic drug: glucose blood Use Accu Chek Guide test strips as instructed to check blood sugar twice daily.   Accu-Chek Guide w/Device Kit 1 each by Does not apply route 2 (two) times a day. Use as instructed to check blood sugar twice daily.   albuterol 108 (90 Base) MCG/ACT inhaler Commonly known as: VENTOLIN HFA Inhale 1-2 puffs into the lungs every 4 (four) hours as needed for wheezing or shortness of breath.   amLODipine 5 MG tablet Commonly known as: NORVASC Take 5 mg by mouth daily.   aspirin 325 MG tablet Take 1 tablet (325 mg total) by mouth daily.   atorvastatin 10 MG tablet Commonly known as: LIPITOR TAKE 1 TABLET (10 MG TOTAL) BY MOUTH DAILY AT 6 PM.   diphenhydramine-acetaminophen 25-500 MG Tabs tablet Commonly known as: TYLENOL PM Take 2 tablets by mouth at bedtime.   Farxiga 5 MG Tabs tablet Generic drug: dapagliflozin propanediol TAKE 1 TABLET BY MOUTH EVERY DAY   fluticasone 50 MCG/ACT nasal spray Commonly known as: FLONASE Place 1 spray into both nostrils daily as needed for allergies or rhinitis.   HYDROcodone-acetaminophen 5-325 MG tablet Commonly known as: NORCO/VICODIN Take 1 tablet by mouth every 6 (six) hours as needed for severe pain.   metFORMIN 500 MG 24 hr tablet Commonly known as: GLUCOPHAGE-XR Take 4 tablets (2,000 mg total) by mouth daily with supper. What changed:   when to take this  additional instructions   methocarbamol 750 MG tablet Commonly known as: ROBAXIN Take 1 tablet (750 mg total) by mouth every 6 (six) hours as needed for muscle spasms.   multivitamin with minerals tablet Take 1 tablet by mouth every morning. Patient takes Flinstones complete with Vitamin D   NovoLOG Mix 70/30 FlexPen (70-30)  100 UNIT/ML FlexPen Generic drug: insulin aspart protamine - aspart Inject 0.16-0.18 mLs (16-18 Units total) into the skin See admin instructions. 18 units in the morning and 16 units at night   Pen Needles 30G X 5 MM Misc 1 each by Does not apply route 2 (two) times daily. Use to inject insulin twice daily.   ramipril 10 MG capsule Commonly known as: ALTACE Take 10 mg by mouth daily.   Semaglutide (1 MG/DOSE) 2 MG/1.5ML Sopn Commonly known as: Ozempic (1 MG/DOSE) Inject 1 mg into the skin once a week.   topiramate 50 MG tablet Commonly known as: TOPAMAX Take 50 mg by mouth at bedtime.   traZODone 50 MG tablet Commonly known as: DESYREL Take 50 mg by mouth at bedtime as needed for sleep.       Allergies:  Allergies  Allergen Reactions  . Liraglutide Other (See Comments)    Other reaction(s): Pancreatitis  *Victoza     Past Medical History:  Diagnosis Date  . Asthma   . Diabetes mellitus   . Heart murmur   . Hyperlipidemia   . Hypertension   . Pancreatitis   . Pneumonia     Past Surgical History:  Procedure Laterality Date  . ABDOMINAL HYSTERECTOMY    . CESAREAN SECTION    . CHOLECYSTECTOMY    . GANGLION CYST EXCISION    . HIP CAPSULECTOMY Right 10/14/2018   Procedure: RIGHT HIP HETEROTOPIC OSSIFICATION RESECTION;  Surgeon: Shona Needles, MD;  Location: [facility];  Service: Orthopedics;  Laterality: Right;  . LAPAROSCOPY      Family History  Problem Relation Age of Onset  . Diabetes Mother   . Hypertension Mother   . Diabetes Maternal Aunt   . Diabetes Paternal Aunt   . Diabetes Maternal Grandmother   . Heart disease Neg Hx     Social History:  reports that she quit smoking about 5 years ago. Her smoking use included cigarettes. She smoked 0.50 packs per day. She has never used smokeless tobacco. She reports current alcohol use. She reports that she does not use drugs.    Review of Systems    Lipid history: was on Lipitor 10 mg Currently not  followed by PCP Last LDL was 75 done in 3/20 but is higher now, she did not ask for a refill on her Lipitor    Lab Results  Component Value Date   CHOL 197 12/21/2018   HDL 49.60 12/21/2018   LDLCALC 127 (H) 12/21/2018   LDLDIRECT 75.0 03/22/2018   TRIG 103.0 12/21/2018   CHOLHDL 4 12/21/2018           Hypertension:  followed by PCP, currently on 26m amlodipine and 5 mg ramipril  This is followed by PCP    BP Readings from Last 3 Encounters:  12/24/18 130/70  10/15/18 104/66  10/01/18 134/70    Last eye exam 04/2016 and is due for follow-up  Most recent foot exam: March 2019   Review of Systems    Physical Examination:  BP 130/70 (BP Location: Right Arm, Patient Position: Sitting, Cuff Size: Normal)   Pulse 98   Ht _0  (1.575 m)   Wt 191 lb 6.4 oz (86.8 kg)   LMP 07/09/2011   SpO2 94%   BMI 35.01 kg/m     ASSESSMENT:  Diabetes type 2, insulin-dependent  See history of present illness for detailed discussion of current diabetes management, blood sugar patterns and problems identified  Her A1c has been gradually improving and now down to 6.3  Likely with her eating less and still recovering from her orthopedic surgery she appears to be having more consistently good reading Blood sugars are relatively lower later in the day but no recent hypoglycemia She has somewhat irregular mealtimes and insulin regimen However has not lost any weight as yet   HYPERLIPIDEMIA: She has much higher LDL from her running out of her atorvastatin   HYPERTENSION: Blood pressure is well controlled No evidence of nephropathy    PLAN:    She will start continue the Accu-Chek meter instead of the insurance provided meter which cannot be downloaded She will try to make sure she is ready to eat when she is taking her insulin dose and not skip the meal Also make sure she has some protein with every meal  No change in Ozempic, metformin or Farxiga Discussed that if her  sugars are consistently lower in the afternoon or early evenings will reduce her morning  dose of insulin She will try to start walking when she is able to  Follow-up in 3 months    There are no Patient Instructions on file for this visit.    Elayne Snare 12/24/2018, 10:31 AM   Note: This office note was prepared with Dragon voice recognition system technology. Any transcriptional errors that result from this process are unintentional.

## 2018-12-28 ENCOUNTER — Ambulatory Visit: Payer: 59 | Admitting: Physical Therapy

## 2018-12-28 ENCOUNTER — Other Ambulatory Visit: Payer: Self-pay

## 2018-12-28 ENCOUNTER — Encounter: Payer: Self-pay | Admitting: Physical Therapy

## 2018-12-28 DIAGNOSIS — R262 Difficulty in walking, not elsewhere classified: Secondary | ICD-10-CM

## 2018-12-28 DIAGNOSIS — M25551 Pain in right hip: Secondary | ICD-10-CM

## 2018-12-28 DIAGNOSIS — M6281 Muscle weakness (generalized): Secondary | ICD-10-CM | POA: Diagnosis not present

## 2018-12-28 NOTE — Therapy (Addendum)
[facility] [place], [place], [address] Phone: [phone]   Fax:  [phone]  Physical Therapy Treatment/Discharge  Patient Details  Name: Veronica Fletcher MRN: [id number] Date of Birth: 1965/04/18 Referring Provider (PT): Katha Hamming, MD   Encounter Date: 12/28/2018  PT End of Session - 12/28/18 0726    Visit Number  10    Number of Visits  16    Date for PT Re-Evaluation  01/07/19    Authorization Type  [employer]    PT Start Time  0719    PT Stop Time  0815    PT Time Calculation (min)  56 min       Past Medical History:  Diagnosis Date  . Asthma   . Diabetes mellitus   . Heart murmur   . Hyperlipidemia   . Hypertension   . Pancreatitis   . Pneumonia     Past Surgical History:  Procedure Laterality Date  . ABDOMINAL HYSTERECTOMY    . CESAREAN SECTION    . CHOLECYSTECTOMY    . GANGLION CYST EXCISION    . HIP CAPSULECTOMY Right 10/14/2018   Procedure: RIGHT HIP HETEROTOPIC OSSIFICATION RESECTION;  Surgeon: Shona Needles, MD;  Location: [facility];  Service: Orthopedics;  Laterality: Right;  . LAPAROSCOPY      There were no vitals filed for this visit.  Subjective Assessment - 12/28/18 0724    Subjective  I woke up yesterday and had increased left low back and hip pain down back of thigh. I had to use the cane and walker to get around yesterday. 6/10 right hip today and 8/10 lw back and left hip.    Currently in Pain?  Yes    Pain Score  8     Pain Location  Back    Pain Orientation  Left    Pain Descriptors / Indicators  --   pulling   Pain Type  Surgical pain    Aggravating Factors   unsure, possibly doing too much    Pain Relieving Factors  rest, stretching                       [facility] Adult PT Treatment/Exercise - 12/28/18 0001      Lumbar Exercises: Stretches   Single Knee to Chest Stretch  3 reps;30 seconds    Single Knee to Chest Stretch Limitations  needs towel assist on  right     Lower Trunk Rotation  10 seconds    Lower Trunk Rotation Limitations  10 reps , cues for comfortable ROM    Pelvic Tilt  10 reps    Pelvic Tilt Limitations  cues for gluteal squeeeze and abdominal draw in       Knee/Hip Exercises: Stretches   Hip Flexor Stretch Limitations  Hang off edge of mat, can come to neutral only     Piriformis Stretch  Left;30 seconds    Piriformis Stretch Limitations  push and pull    Other Knee/Hip Stretches  seated EOM, physioball roll outs for hip flexion x10    Other Knee/Hip Stretches  slant board , LTR wide BOS       Knee/Hip Exercises: Aerobic   Nustep  L5 5 min UE & LE      Knee/Hip Exercises: Standing   Hip Flexion  10 reps    Hip Flexion Limitations  marching       Knee/Hip Exercises: Seated   Sit to Sand  10  reps   no UE needed today     Knee/Hip Exercises: Supine   Bridges  --    Bridges Limitations  gluteal sets       Moist Heat Therapy   Number Minutes Moist Heat  15 Minutes    Moist Heat Location  Hip   and lumbar              PT Short Term Goals - 12/02/18 0735      PT SHORT TERM GOAL #1   Title  independent with initial HEP     Baseline  beginning HEP issued at evaluation    Time  4    Period  Weeks    Status  On-going      PT SHORT TERM GOAL #2   Title  Pt will be able to tolerate standing for 20 minutes in order to prepare meals with pain </= 3/10.    Baseline  tolerating 20 minutes standing for cooking    Time  4    Period  Weeks    Status  Achieved        PT Long Term Goals - 12/20/18 0947      PT LONG TERM GOAL #1   Title  independent with HEP and understand how to progress herself    Time  8    Period  Weeks    Status  On-going      PT LONG TERM GOAL #2   Title  Pt able to improve R hip strength to >/= 4+/5 in order to improve funcitonal mobility.    Time  8    Period  Weeks    Status  On-going      PT LONG TERM GOAL #3   Title  Pt will improve her FOTO score from 64% limitation to  </= 44% limitation.    Baseline  improving    Time  8    Period  Days    Status  On-going      PT LONG TERM GOAL #4   Title  Pt will be able to amb to grocery store with pain </= 3/10 in R hip.    Time  8    Period  Weeks    Status  Unable to assess      PT LONG TERM GOAL #5   Title  Pt will improve R hip flexion in order to pick up bag of groceries off the floor without difficutly.    Time  8    Period  Weeks    Status  Unable to assess            Plan - 12/28/18 0828    Clinical Impression Statement  Pt reports feeling good after last session and may have over done it at home afterward. Yesterday she awoke in more back and left leg pain. She initially canceled today's appointment and then was able to make it after all. Instructed her in gentle stretching mobility exercises for hip and lumbar. HMP again pst session. Afterward she reported decreased pain.    PT Next Visit Plan  review quad sets- try SAQ, continue standing strength, continue sit-stand/hip hinge, continue bilateral stretching to reduce back pain.    PT Home Exercise Plan  Access Code: [email or web address]  (ankle pumps, heel slides, LAQ, clam shells with red theraband, SAQ); prone/standing glut set, prone HS curl, quad set, standing hip abd, 12/23/18 added pirifomris strtech, knee to chest, LTR, pelvic tilit.s  Patient will benefit from skilled therapeutic intervention in order to improve the following deficits and impairments:  Pain, Decreased strength, Decreased range of motion, Difficulty walking, Decreased activity tolerance  Visit Diagnosis: Muscle weakness (generalized)  Difficulty in walking, not elsewhere classified  Pain in right hip     Problem List Patient Active Problem List   Diagnosis Date Noted  . Heterotopic ossification 10/14/2018  . Heterotopic ossification of bone 09/27/2018  . HTN (hypertension) 02/09/2013  . Type II or unspecified type diabetes mellitus with unspecified complication,  uncontrolled 02/09/2013  . DKA (diabetic ketoacidoses) ([place]) 02/06/2013  . Acute pancreatitis 02/06/2013  . Menorrhagia 08/13/2011  . Fibroids, submucosal 08/13/2011  . Anemia 08/13/2011    Dorene Ar, PTA 12/28/2018, 8:34 AM  [facility] [address] [place], [place], [address] Phone: [phone]   Fax:  [phone]  Name: Veronica Fletcher MRN: [id number] Date of Birth: Dec 27, 1965  PHYSICAL THERAPY DISCHARGE SUMMARY  Visits from Start of Care: 10  Current functional level related to goals / functional outcomes: See above   Remaining deficits: See above   Education / Equipment: Anatomy of condition, POC, HEP, exercise form/rationale  Plan: Patient agrees to discharge.  Patient goals were not met. Patient is being discharged due to not returning since the last visit.  ?????     Nadja Lina C. Hightower PT, DPT 01/27/19 4:21 PM

## 2018-12-28 NOTE — Therapy (Deleted)
[place], [place], [address] Phone: [phone]   Fax:  [phone]  Physical Therapy Treatment  Patient Details  Name: Veronica Fletcher MRN: [id number] Date of Birth: 1965-07-16 Referring Provider (PT): Katha Hamming, MD   Encounter Date: 12/28/2018  PT End of Session - 12/28/18 0726    Visit Number  10    Number of Visits  16    Date for PT Re-Evaluation  01/07/19    Authorization Type  [employer]    PT Start Time  0719    PT Stop Time  0815    PT Time Calculation (min)  56 min       Past Medical History:  Diagnosis Date  . Asthma   . Diabetes mellitus   . Heart murmur   . Hyperlipidemia   . Hypertension   . Pancreatitis   . Pneumonia     Past Surgical History:  Procedure Laterality Date  . ABDOMINAL HYSTERECTOMY    . CESAREAN SECTION    . CHOLECYSTECTOMY    . GANGLION CYST EXCISION    . HIP CAPSULECTOMY Right 10/14/2018   Procedure: RIGHT HIP HETEROTOPIC OSSIFICATION RESECTION;  Surgeon: Shona Needles, MD;  Location: [facility];  Service: Orthopedics;  Laterality: Right;  . LAPAROSCOPY      There were no vitals filed for this visit.  Subjective Assessment - 12/28/18 0724    Subjective  I woke up yesterday and had increased left low back and hip pain down back of thigh. I had to use the cane and walker to get around yesterday. 6/10 right hip today and 8/10 lw back and left hip.    Currently in Pain?  Yes    Pain Score  8     Pain Location  Back    Pain Orientation  Left    Pain Descriptors / Indicators  --   pulling   Pain Type  Surgical pain    Aggravating Factors   unsure, possibly doing too much    Pain Relieving Factors  rest, stretching                       [facility] Adult PT Treatment/Exercise - 12/28/18 0001      Lumbar Exercises: Stretches   Single Knee to Chest Stretch  3 reps;30 seconds    Single Knee to Chest Stretch Limitations  needs towel assist on right     Lower Trunk Rotation  10 seconds    Lower Trunk Rotation Limitations  10 reps , cues for comfortable ROM    Pelvic Tilt  10 reps    Pelvic Tilt Limitations  cues for gluteal squeeeze and abdominal draw in       Knee/Hip Exercises: Stretches   Hip Flexor Stretch Limitations  Hang off edge of mat, can come to neutral only     Piriformis Stretch  Left;30 seconds    Piriformis Stretch Limitations  push and pull    Other Knee/Hip Stretches  seated EOM, physioball roll outs for hip flexion x10    Other Knee/Hip Stretches  slant board , LTR wide BOS       Knee/Hip Exercises: Aerobic   Nustep  L5 5 min UE & LE      Knee/Hip Exercises: Standing   Hip Flexion  10 reps    Hip Flexion Limitations  marching       Knee/Hip Exercises: Seated   Sit to Sand  10 reps  no UE needed today     Knee/Hip Exercises: Supine   Bridges  --    Bridges Limitations  gluteal sets                PT Short Term Goals - 12/02/18 0735      PT SHORT TERM GOAL #1   Title  independent with initial HEP     Baseline  beginning HEP issued at evaluation    Time  4    Period  Weeks    Status  On-going      PT SHORT TERM GOAL #2   Title  Pt will be able to tolerate standing for 20 minutes in order to prepare meals with pain </= 3/10.    Baseline  tolerating 20 minutes standing for cooking    Time  4    Period  Weeks    Status  Achieved        PT Long Term Goals - 12/20/18 0947      PT LONG TERM GOAL #1   Title  independent with HEP and understand how to progress herself    Time  8    Period  Weeks    Status  On-going      PT LONG TERM GOAL #2   Title  Pt able to improve R hip strength to >/= 4+/5 in order to improve funcitonal mobility.    Time  8    Period  Weeks    Status  On-going      PT LONG TERM GOAL #3   Title  Pt will improve her FOTO score from 64% limitation to </= 44% limitation.    Baseline  improving    Time  8    Period  Days    Status  On-going      PT LONG TERM GOAL  #4   Title  Pt will be able to amb to grocery store with pain </= 3/10 in R hip.    Time  8    Period  Weeks    Status  Unable to assess      PT LONG TERM GOAL #5   Title  Pt will improve R hip flexion in order to pick up bag of groceries off the floor without difficutly.    Time  8    Period  Weeks    Status  Unable to assess            Plan - 12/28/18 0828    Clinical Impression Statement  Pt reports feeling good after last session and may have over done it at home afterward. Yesterday she awoke in more back and left leg pain. She initially canceled today's appointment and then was able to make it after all. Instructed her in gentle stretching mobility exercises for hip and lumbar. [place] again post session. Afterward she reported decreased pain.    PT Next Visit Plan  review quad sets- try SAQ, continue standing strength, continue sit-stand/hip hinge, continue bilateral stretching to reduce back pain.    PT Home Exercise Plan  Access Code: [email or web address]  (ankle pumps, heel slides, LAQ, clam shells with red theraband, SAQ); prone/standing glut set, prone HS curl, quad set, standing hip abd, 12/23/18 added pirifomris strtech, knee to chest, LTR, pelvic tilit.s       Patient will benefit from skilled therapeutic intervention in order to improve the following deficits and impairments:  Pain, Decreased strength, Decreased range of motion, Difficulty walking, Decreased activity tolerance  Visit Diagnosis: Muscle weakness (generalized)  Difficulty in walking, not elsewhere classified  Pain in right hip     Problem List Patient Active Problem List   Diagnosis Date Noted  . Heterotopic ossification 10/14/2018  . Heterotopic ossification of bone 09/27/2018  . HTN (hypertension) 02/09/2013  . Type II or unspecified type diabetes mellitus with unspecified complication, uncontrolled 02/09/2013  . DKA (diabetic ketoacidoses) ([facility]) 02/06/2013  . Acute pancreatitis 02/06/2013  . Menorrhagia  08/13/2011  . Fibroids, submucosal 08/13/2011  . Anemia 08/13/2011    Dorene Ar, PTA 12/28/2018, 8:33 AM  [facility] [address] [place], [place], [address] Phone: [phone]   Fax:  [phone]  Name: Veronica Fletcher MRN: [id number] Date of Birth: 02/09/1965

## 2018-12-30 ENCOUNTER — Ambulatory Visit: Payer: 59 | Admitting: Physical Therapy

## 2019-01-04 ENCOUNTER — Ambulatory Visit: Payer: 59 | Admitting: Physical Therapy

## 2019-01-16 ENCOUNTER — Other Ambulatory Visit: Payer: Self-pay | Admitting: Endocrinology

## 2019-01-26 DIAGNOSIS — I10 Essential (primary) hypertension: Secondary | ICD-10-CM | POA: Diagnosis not present

## 2019-01-26 DIAGNOSIS — K219 Gastro-esophageal reflux disease without esophagitis: Secondary | ICD-10-CM | POA: Diagnosis not present

## 2019-01-26 DIAGNOSIS — E669 Obesity, unspecified: Secondary | ICD-10-CM | POA: Diagnosis not present

## 2019-01-26 DIAGNOSIS — M5416 Radiculopathy, lumbar region: Secondary | ICD-10-CM | POA: Diagnosis not present

## 2019-01-31 DIAGNOSIS — M545 Low back pain: Secondary | ICD-10-CM | POA: Diagnosis not present

## 2019-01-31 DIAGNOSIS — M5416 Radiculopathy, lumbar region: Secondary | ICD-10-CM | POA: Diagnosis not present

## 2019-02-03 ENCOUNTER — Other Ambulatory Visit: Payer: Self-pay | Admitting: Endocrinology

## 2019-02-03 DIAGNOSIS — E1165 Type 2 diabetes mellitus with hyperglycemia: Secondary | ICD-10-CM

## 2019-02-03 DIAGNOSIS — Z794 Long term (current) use of insulin: Secondary | ICD-10-CM

## 2019-02-22 DIAGNOSIS — M61551 Other ossification of muscle, right thigh: Secondary | ICD-10-CM | POA: Diagnosis not present

## 2019-03-19 DIAGNOSIS — M5416 Radiculopathy, lumbar region: Secondary | ICD-10-CM | POA: Diagnosis not present

## 2019-03-22 ENCOUNTER — Other Ambulatory Visit (INDEPENDENT_AMBULATORY_CARE_PROVIDER_SITE_OTHER): Payer: BC Managed Care – PPO

## 2019-03-22 ENCOUNTER — Other Ambulatory Visit: Payer: Self-pay

## 2019-03-22 DIAGNOSIS — E7800 Pure hypercholesterolemia, unspecified: Secondary | ICD-10-CM | POA: Diagnosis not present

## 2019-03-22 DIAGNOSIS — Z794 Long term (current) use of insulin: Secondary | ICD-10-CM | POA: Diagnosis not present

## 2019-03-22 DIAGNOSIS — E1165 Type 2 diabetes mellitus with hyperglycemia: Secondary | ICD-10-CM | POA: Diagnosis not present

## 2019-03-22 LAB — LIPID PANEL
Cholesterol: 128 mg/dL (ref 0–200)
HDL: 48.1 mg/dL (ref >39.00)
LDL Cholesterol: 64 mg/dL (ref 0–99)
NonHDL: 79.64
Total CHOL/HDL Ratio: 3
Triglycerides: 80 mg/dL (ref 0.0–149.0)
VLDL: 16 mg/dL (ref 0.0–40.0)

## 2019-03-22 LAB — COMPREHENSIVE METABOLIC PANEL
ALT: 11 U/L (ref 0–35)
AST: 16 U/L (ref 0–37)
Albumin: 4.1 g/dL (ref 3.5–5.2)
Alkaline Phosphatase: 80 U/L (ref 39–117)
BUN: 15 mg/dL (ref 6–23)
CO2: 28 mEq/L (ref 19–32)
Calcium: 9.1 mg/dL (ref 8.4–10.5)
Chloride: 104 mEq/L (ref 96–112)
Creatinine, Ser: 0.7 mg/dL (ref 0.40–1.20)
GFR: 105.69 mL/min (ref >60.00)
Glucose, Bld: 84 mg/dL (ref 70–99)
Potassium: 3.4 mEq/L — ABNORMAL LOW (ref 3.5–5.1)
Sodium: 139 mEq/L (ref 135–145)
Total Bilirubin: 0.6 mg/dL (ref 0.2–1.2)
Total Protein: 7.3 g/dL (ref 6.0–8.3)

## 2019-03-22 LAB — HEMOGLOBIN A1C: Hgb A1c MFr Bld: 6.7 % — ABNORMAL HIGH (ref 4.6–6.5)

## 2019-03-25 ENCOUNTER — Other Ambulatory Visit: Payer: Self-pay

## 2019-03-25 ENCOUNTER — Ambulatory Visit (INDEPENDENT_AMBULATORY_CARE_PROVIDER_SITE_OTHER): Payer: BC Managed Care – PPO | Admitting: Endocrinology

## 2019-03-25 VITALS — BP 136/82 | HR 94 | Ht 62.0 in | Wt 189.2 lb

## 2019-03-25 DIAGNOSIS — Z794 Long term (current) use of insulin: Secondary | ICD-10-CM

## 2019-03-25 DIAGNOSIS — E7800 Pure hypercholesterolemia, unspecified: Secondary | ICD-10-CM

## 2019-03-25 DIAGNOSIS — E1165 Type 2 diabetes mellitus with hyperglycemia: Secondary | ICD-10-CM

## 2019-03-25 DIAGNOSIS — I10 Essential (primary) hypertension: Secondary | ICD-10-CM | POA: Diagnosis not present

## 2019-03-25 DIAGNOSIS — M5136 Other intervertebral disc degeneration, lumbar region: Secondary | ICD-10-CM | POA: Diagnosis not present

## 2019-03-25 NOTE — Patient Instructions (Signed)
Check blood sugars on waking up 3-4 days a week  Also check blood sugars about 2 hours after meals and do this after different meals by rotation  Recommended blood sugar levels on waking up are 90-130 and about 2 hours after meal is 130-160  Please bring your blood sugar monitor to each visit, thank you  If on steroid shot may need 4-8 units more

## 2019-03-25 NOTE — Progress Notes (Signed)
Patient ID: Veronica Fletcher, female   DOB: Oct 18, 1965, 54 y.o.   MRN: [id number]           Reason for Appointment: Follow-up for Type 2 Diabetes  Referring physician: Harrington Challenger  History of Present Illness:          Date of diagnosis of type 2 diabetes mellitus: 2005       Background history:  She had been started on metformin initially and not clear what other treatment she had taken subsequently, records are being awaited today She apparently was doing very well with a combination of Victoza and metformin until 2015 and she thinks she was losing weight with this.  She had taken Victoza for several months at least However according to his lab records her best A1c in 2014 was only 7.9 Apparently she was taken off Victoza when she was admitted for abdominal pain presumed to be from pancreatitis in 01/2013 She was then started on premixed insulin and taken off metformin also on discharge. She was referred here for poorly controlled diabetes with A1c consistently around 10% On her initial consultation she was given metformin and Jardiance in addition to her insulin  V-go pump 20 unit basal and boluses 4 units previously stopped  Recent history:   INSULIN regimen is: Novolog Mix at meals: 18 a.m.-16 before dinner   Non-insulin hypoglycemic drugs: Farxiga 10 mg daily, metformin ER 1000 mg daily Ozempic 1.0 mg weekly  Her A1c is slightly higher at 6.7 compared to 6.3   Current management, blood sugar patterns and problems identified:  She has not checked her sugar much lately and mostly in the morning hours over the last week  Some of her readings in the mornings are postprandial but not clear which are before eating  Her lab glucose was 84 fasting  Sometimes she thinks she has high readings because of pain  Otherwise is able to maintain her weight which is down about 2 pounds  She is not able to do any activity except for slow walking in the stores  Has not had any side effects from  her Ozempic  Taking her Wilder Glade her regularly  Also no hypoglycemia  Insulin is taken before meals consistently   Side effects from medications have been:?  Victoza caused pancreatitis  Compliance with the medical regimen: Good   Glucose monitoring:  done 0.5 times a day         Glucometer:  Verio   Blood Glucose readings by meter download   PRE-MEAL  morning Lunch Dinner Bedtime Overall  Glucose range:  70-176   103  100   Mean/median:  140     135   POST-MEAL PC Breakfast PC Lunch PC Dinner  Glucose range:    163  Mean/median:      Previous readings:  PRE-MEAL  morning Lunch Dinner Bedtime Overall  Glucose range:  100-190  139  94-147    Mean/median:  139     125   POST-MEAL PC Breakfast PC Lunch PC Dinner  Glucose range:    75-139  Mean/median:        :Self-care: The diet that the patient has been following is: tries to limit portions.      Typical meal intake: Breakfast is oatmeal/yogurt, lunch is a grilled chicken, evening meal is meat and 2 vegetables, will have snacks on yogurt    Dinner at 6 pm           Dietician visit, most recent:2014  Weight history:   Wt Readings from Last 3 Encounters:  03/25/19 189 lb 3.2 oz (85.8 kg)  12/24/18 191 lb 6.4 oz (86.8 kg)  10/14/18 192 lb (87.1 kg)    Glycemic control:   Lab Results  Component Value Date   HGBA1C 6.7 (H) 03/22/2019   HGBA1C 6.3 12/21/2018   HGBA1C 7.1 (H) 09/29/2018   Lab Results  Component Value Date   MICROALBUR <0.7 09/29/2018   LDLCALC 64 03/22/2019   CREATININE 0.70 03/22/2019   Lab Results  Component Value Date   MICRALBCREAT 0.9 09/29/2018    Lab Results  Component Value Date   FRUCTOSAMINE 293 (H) 03/22/2018   FRUCTOSAMINE 250 08/12/2017   FRUCTOSAMINE 290 (H) 06/16/2017   FRUCTOSAMINE 284 10/22/2016     Lab on 03/22/2019  Component Date Value Ref Range Status  . Cholesterol 03/22/2019 128  0 - 200 mg/dL Final   ATP III Classification        Desirable:  < 200 mg/dL               Borderline High:  200 - 239 mg/dL          High:  > = 240 mg/dL  . Triglycerides 03/22/2019 80.0  0.0 - 149.0 mg/dL Final   Normal:  <150 mg/dLBorderline High:  150 - 199 mg/dL  . HDL 03/22/2019 48.10  >39.00 mg/dL Final  . VLDL 03/22/2019 16.0  0.0 - 40.0 mg/dL Final  . LDL Cholesterol 03/22/2019 64  0 - 99 mg/dL Final  . Total CHOL/HDL Ratio 03/22/2019 3   Final                  Men          Women1/2 Average Risk     3.4          3.3Average Risk          5.0          4.42X Average Risk          9.6          7.13X Average Risk          15.0          11.0                      . NonHDL 03/22/2019 79.64   Final   NOTE:  Non-HDL goal should be 30 mg/dL higher than patient's LDL goal (i.e. LDL goal of < 70 mg/dL, would have non-HDL goal of < 100 mg/dL)  . Sodium 03/22/2019 139  135 - 145 mEq/L Final  . Potassium 03/22/2019 3.4* 3.5 - 5.1 mEq/L Final  . Chloride 03/22/2019 104  96 - 112 mEq/L Final  . CO2 03/22/2019 28  19 - 32 mEq/L Final  . Glucose, Bld 03/22/2019 84  70 - 99 mg/dL Final  . BUN 03/22/2019 15  6 - 23 mg/dL Final  . Creatinine, Ser 03/22/2019 0.70  0.40 - 1.20 mg/dL Final  . Total Bilirubin 03/22/2019 0.6  0.2 - 1.2 mg/dL Final  . Alkaline Phosphatase 03/22/2019 80  39 - 117 U/L Final  . AST 03/22/2019 16  0 - 37 U/L Final  . ALT 03/22/2019 11  0 - 35 U/L Final  . Total Protein 03/22/2019 7.3  6.0 - 8.3 g/dL Final  . Albumin 03/22/2019 4.1  3.5 - 5.2 g/dL Final  . GFR 03/22/2019 105.69  >60.00 mL/min Final  . Calcium 03/22/2019  9.1  8.4 - 10.5 mg/dL Final  . Hgb A1c MFr Bld 03/22/2019 6.7* 4.6 - 6.5 % Final   Glycemic Control Guidelines for People with Diabetes:Non Diabetic:  <6%Goal of Therapy: <7%Additional Action Suggested:  >8%       Allergies as of 03/25/2019      Reactions   Liraglutide Other (See Comments)   Other reaction(s): Pancreatitis *Victoza       Medication List       Accurate as of March 25, 2019 11:59 PM. If  you have any questions, ask your nurse or doctor.        Accu-Chek FastClix Lancets Misc 1 each by Does not apply route 2 (two) times a day. Use Accu Chek Fastclix lancets to check blood sugar twice daily.   Accu-Chek Guide test strip Generic drug: glucose blood Use Accu Chek Guide test strips as instructed to check blood sugar twice daily.   Accu-Chek Guide w/Device Kit 1 each by Does not apply route 2 (two) times a day. Use as instructed to check blood sugar twice daily.   albuterol 108 (90 Base) MCG/ACT inhaler Commonly known as: VENTOLIN HFA Inhale 1-2 puffs into the lungs every 4 (four) hours as needed for wheezing or shortness of breath.   amLODipine 5 MG tablet Commonly known as: NORVASC Take 5 mg by mouth daily.   aspirin 325 MG tablet Take 1 tablet (325 mg total) by mouth daily.   atorvastatin 10 MG tablet Commonly known as: LIPITOR Take 1 tablet (10 mg total) by mouth daily at 6 PM.   diphenhydramine-acetaminophen 25-500 MG Tabs tablet Commonly known as: TYLENOL PM Take 2 tablets by mouth at bedtime.   Farxiga 5 MG Tabs tablet Generic drug: dapagliflozin propanediol TAKE 1 TABLET BY MOUTH EVERY DAY   fluticasone 50 MCG/ACT nasal spray Commonly known as: FLONASE Place 1 spray into both nostrils daily as needed for allergies or rhinitis.   HYDROcodone-acetaminophen 5-325 MG tablet Commonly known as: NORCO/VICODIN Take 1 tablet by mouth every 6 (six) hours as needed for severe pain.   metFORMIN 500 MG 24 hr tablet Commonly known as: GLUCOPHAGE-XR Take 2 tablets (1,000 mg total) by mouth daily with supper.   methocarbamol 750 MG tablet Commonly known as: ROBAXIN Take 1 tablet (750 mg total) by mouth every 6 (six) hours as needed for muscle spasms.   multivitamin with minerals tablet Take 1 tablet by mouth every morning. Patient takes Flinstones complete with Vitamin D   NovoLOG Mix 70/30 FlexPen (70-30) 100 UNIT/ML FlexPen Generic drug: insulin aspart  protamine - aspart Inject 0.16-0.18 mLs (16-18 Units total) into the skin See admin instructions. 18 units in the morning and 16 units at night   Ozempic (1 MG/DOSE) 2 MG/1.5ML Sopn Generic drug: Semaglutide (1 MG/DOSE) INJECT 1 MG INTO THE SKIN ONCE A WEEK.   Pen Needles 30G X 5 MM Misc 1 each by Does not apply route 2 (two) times daily. Use to inject insulin twice daily.   ramipril 10 MG capsule Commonly known as: ALTACE Take 10 mg by mouth daily.   topiramate 50 MG tablet Commonly known as: TOPAMAX Take 50 mg by mouth at bedtime.   traZODone 50 MG tablet Commonly known as: DESYREL Take 50 mg by mouth at bedtime as needed for sleep.       Allergies:  Allergies  Allergen Reactions  . Liraglutide Other (See Comments)    Other reaction(s): Pancreatitis  *Victoza     Past Medical History:  Diagnosis Date  . Asthma   . Diabetes mellitus   . Heart murmur   . Hyperlipidemia   . Hypertension   . Pancreatitis   . Pneumonia     Past Surgical History:  Procedure Laterality Date  . ABDOMINAL HYSTERECTOMY    . CESAREAN SECTION    . CHOLECYSTECTOMY    . GANGLION CYST EXCISION    . HIP CAPSULECTOMY Right 10/14/2018   Procedure: RIGHT HIP HETEROTOPIC OSSIFICATION RESECTION;  Surgeon: Shona Needles, MD;  Location: [facility];  Service: Orthopedics;  Laterality: Right;  . LAPAROSCOPY      Family History  Problem Relation Age of Onset  . Diabetes Mother   . Hypertension Mother   . Diabetes Maternal Aunt   . Diabetes Paternal Aunt   . Diabetes Maternal Grandmother   . Heart disease Neg Hx     Social History:  reports that she quit smoking about 6 years ago. Her smoking use included cigarettes. She smoked 0.50 packs per day. She has never used smokeless tobacco. She reports current alcohol use. She reports that she does not use drugs.    Review of Systems    Lipid history: was on Lipitor 10 mg Now taking it regularly with better results    Lab Results  Component  Value Date   CHOL 128 03/22/2019   HDL 48.10 03/22/2019   LDLCALC 64 03/22/2019   LDLDIRECT 75.0 03/22/2018   TRIG 80.0 03/22/2019   CHOLHDL 3 03/22/2019           Hypertension:  followed by PCP, currently on 41m amlodipine and 5 mg ramipril  This is followed by PCP    BP Readings from Last 3 Encounters:  03/25/19 136/82  12/24/18 130/70  10/15/18 104/66    Last eye exam 04/2016 and is due for follow-up  Most recent foot exam: March 2021   Review of Systems    Physical Examination:  BP 136/82 (BP Location: Left Arm, Patient Position: Sitting, Cuff Size: Normal)   Pulse 94   Ht 5' 2"  (1.575 m)   Wt 189 lb 3.2 oz (85.8 kg)   LMP 07/09/2011   SpO2 97%   BMI 34.61 kg/m   Diabetic Foot Exam - Simple   Simple Foot Form Diabetic Foot exam was performed with the following findings: Yes 03/25/2019  9:25 AM  Visual Inspection No deformities, no ulcerations, no other skin breakdown bilaterally: Yes Sensation Testing Intact to touch and monofilament testing bilaterally: Yes Pulse Check Posterior Tibialis and Dorsalis pulse intact bilaterally: Yes Comments    Minimal decrease in monofilament sensation medially on the foot mid part No edema present  ASSESSMENT:  Diabetes type 2, insulin-dependent  See history of present illness for detailed discussion of current diabetes management, blood sugar patterns and problems identified  She is on Ozempic, premixed insulin, Farxiga and Metformin  Her A1c has gone up slightly to 6.7  Blood sugar monitoring has been in consistent and especially in the evenings blood sugars are not available Also has limited ability to exercise She thinks her diet is fairly good but her morning sugars fluctuate She thinks her having more pain lately increases her blood sugars   HYPERLIPIDEMIA: Previously had higher LDL from her running out of her atorvastatin This is now back to target  HYPERTENSION: Blood pressure is well controlled   Left-sided sciatica: Discussed that this is likely causing some numbness on the left foot  PLAN:    She will check her blood sugars  more consistently especially after meals We will continue her insulin dose unchanged since no consistent pattern emerging from her home readings Also no hypoglycemia Continue Ozempic, metformin or Wilder Glade She will also report if she starts getting any hypoglycemia  Stay on same dose of Lipitor consistently  She will try to start walking when she is able to  She will discuss her left foot numbness with her neurosurgeon  Follow-up in 3 months    Patient Instructions  Check blood sugars on waking up 3-4 days a week  Also check blood sugars about 2 hours after meals and do this after different meals by rotation  Recommended blood sugar levels on waking up are 90-130 and about 2 hours after meal is 130-160  Please bring your blood sugar monitor to each visit, thank you  If on steroid shot may need 4-8 units more     Elayne Snare 03/27/2019, 9:08 PM   Note: This office note was prepared with Dragon voice recognition system technology. Any transcriptional errors that result from this process are unintentional.

## 2019-03-27 ENCOUNTER — Encounter: Payer: Self-pay | Admitting: Endocrinology

## 2019-04-07 ENCOUNTER — Ambulatory Visit: Payer: BC Managed Care – PPO | Attending: Internal Medicine

## 2019-04-07 DIAGNOSIS — Z23 Encounter for immunization: Secondary | ICD-10-CM

## 2019-04-07 NOTE — Progress Notes (Signed)
   Covid-19 Vaccination Clinic  Name:  Veronica Fletcher    MRN: [id number] DOB: [birth year]  04/07/2019  [name] was observed post Covid-19 immunization for 15 minutes without incident. She was provided with Vaccine Information Sheet and instruction to access the V-Safe system.   [name] was instructed to call 911 with any severe reactions post vaccine: Marland Kitchen Difficulty breathing  . Swelling of face and throat  . A fast heartbeat  . A bad rash all over body  . Dizziness and weakness   Immunizations Administered    Name Date Dose VIS Date Route   Pfizer COVID-19 Vaccine 04/07/2019  9:48 AM 0.3 mL 12/24/2018 Intramuscular   Manufacturer: [place]   Lot: [id number]   [place]: [id number]

## 2019-04-14 DIAGNOSIS — M5416 Radiculopathy, lumbar region: Secondary | ICD-10-CM | POA: Diagnosis not present

## 2019-04-14 DIAGNOSIS — M5136 Other intervertebral disc degeneration, lumbar region: Secondary | ICD-10-CM | POA: Diagnosis not present

## 2019-04-26 DIAGNOSIS — M5416 Radiculopathy, lumbar region: Secondary | ICD-10-CM | POA: Diagnosis not present

## 2019-05-04 ENCOUNTER — Ambulatory Visit: Payer: BC Managed Care – PPO | Attending: Internal Medicine

## 2019-05-04 DIAGNOSIS — Z23 Encounter for immunization: Secondary | ICD-10-CM

## 2019-05-04 NOTE — Progress Notes (Signed)
   Covid-19 Vaccination Clinic  Name:  Veronica Fletcher    MRN: [id number] DOB: January 16, 1965  05/04/2019  [name] was observed post Covid-19 immunization for 15 minutes without incident. She was provided with Vaccine Information Sheet and instruction to access the V-Safe system.   [name] was instructed to call 911 with any severe reactions post vaccine: Marland Kitchen Difficulty breathing  . Swelling of face and throat  . A fast heartbeat  . A bad rash all over body  . Dizziness and weakness   Immunizations Administered    Name Date Dose VIS Date Route   Pfizer COVID-19 Vaccine 05/04/2019  8:22 AM 0.3 mL 03/09/2018 Intramuscular   Manufacturer: [place]   Lot: [id number]   [place]: [id number]

## 2019-05-05 DIAGNOSIS — M5416 Radiculopathy, lumbar region: Secondary | ICD-10-CM | POA: Diagnosis not present

## 2019-05-07 ENCOUNTER — Other Ambulatory Visit: Payer: Self-pay | Admitting: Endocrinology

## 2019-05-09 ENCOUNTER — Other Ambulatory Visit: Payer: Self-pay

## 2019-05-09 MED ORDER — OZEMPIC (1 MG/DOSE) 4 MG/3ML ~~LOC~~ SOPN: 1.0000 mg | PEN_INJECTOR | SUBCUTANEOUS | 0 refills | Status: DC

## 2019-05-10 DIAGNOSIS — M5416 Radiculopathy, lumbar region: Secondary | ICD-10-CM | POA: Diagnosis not present

## 2019-05-13 DIAGNOSIS — M5416 Radiculopathy, lumbar region: Secondary | ICD-10-CM | POA: Diagnosis not present

## 2019-05-16 ENCOUNTER — Other Ambulatory Visit: Payer: Self-pay | Admitting: Endocrinology

## 2019-05-19 DIAGNOSIS — Z6835 Body mass index (BMI) 35.0-35.9, adult: Secondary | ICD-10-CM | POA: Diagnosis not present

## 2019-05-19 DIAGNOSIS — M545 Low back pain: Secondary | ICD-10-CM | POA: Diagnosis not present

## 2019-05-20 DIAGNOSIS — M5416 Radiculopathy, lumbar region: Secondary | ICD-10-CM | POA: Diagnosis not present

## 2019-06-06 DIAGNOSIS — Z6835 Body mass index (BMI) 35.0-35.9, adult: Secondary | ICD-10-CM | POA: Diagnosis not present

## 2019-06-06 DIAGNOSIS — M545 Low back pain: Secondary | ICD-10-CM | POA: Diagnosis not present

## 2019-06-07 ENCOUNTER — Telehealth: Payer: Self-pay

## 2019-06-07 DIAGNOSIS — M5416 Radiculopathy, lumbar region: Secondary | ICD-10-CM | POA: Diagnosis not present

## 2019-06-07 NOTE — Telephone Encounter (Signed)
This form was received. It was labeled and put in the folder for MD to sign. Once signed, it will be faxed back.

## 2019-06-07 NOTE — Telephone Encounter (Signed)
Patient came in today to drop off form to be signed by Dr. Dwyane Dee for a fusion-please fax once signed to the number highlighted on the form-placing form in your box

## 2019-06-08 ENCOUNTER — Ambulatory Visit: Payer: Self-pay | Admitting: Orthopedic Surgery

## 2019-06-15 DIAGNOSIS — I10 Essential (primary) hypertension: Secondary | ICD-10-CM | POA: Diagnosis not present

## 2019-06-15 DIAGNOSIS — E1165 Type 2 diabetes mellitus with hyperglycemia: Secondary | ICD-10-CM | POA: Diagnosis not present

## 2019-06-15 DIAGNOSIS — M545 Low back pain: Secondary | ICD-10-CM | POA: Diagnosis not present

## 2019-06-16 ENCOUNTER — Telehealth: Payer: Self-pay

## 2019-06-16 NOTE — Telephone Encounter (Signed)
FAXED [place]: Surgical clearance form Other records requested: none at this time  All above requested information has been faxed successfully to [employer] listed above. Documents and fax confirmation have been placed in the faxed file for future reference.

## 2019-06-28 ENCOUNTER — Ambulatory Visit: Payer: Self-pay | Admitting: Orthopedic Surgery

## 2019-06-28 DIAGNOSIS — M5416 Radiculopathy, lumbar region: Secondary | ICD-10-CM | POA: Diagnosis not present

## 2019-06-28 NOTE — H&P (Deleted)
  The note originally documented on this encounter has been moved the the encounter in which it belongs.  

## 2019-06-28 NOTE — H&P (Signed)
Subjective:   Veronica Fletcher is a pleasant 54 year old female with a history significant for diabetes but this he well-controlled) And hypertension who has been suffering from low back pain and radicular Right leg pain consistent with spinal stenosis. Patient has failed to improve despite conservative treatment including activity modification, injection therapy, and physical therapy. At this point her pain is severe and affecting her quality-of-life therefore she would like to move forward with surgical intervention. She is scheduled for XLIF L2-3 w/PSFI L2-3 on 07/07/19 At [facility] with Dr. Rolena Infante.  Patient Active Problem List   Diagnosis Date Noted  . Heterotopic ossification 10/14/2018  . Heterotopic ossification of bone 09/27/2018  . HTN (hypertension) 02/09/2013  . Type II or unspecified type diabetes mellitus with unspecified complication, uncontrolled 02/09/2013  . DKA (diabetic ketoacidoses) ([place]) 02/06/2013  . Acute pancreatitis 02/06/2013  . Menorrhagia 08/13/2011  . Fibroids, submucosal 08/13/2011  . Anemia 08/13/2011   Past Medical History:  Diagnosis Date  . Asthma   . Diabetes mellitus   . Heart murmur   . Hyperlipidemia   . Hypertension   . Pancreatitis   . Pneumonia     Past Surgical History:  Procedure Laterality Date  . ABDOMINAL HYSTERECTOMY    . CESAREAN SECTION    . CHOLECYSTECTOMY    . GANGLION CYST EXCISION    . HIP CAPSULECTOMY Right 10/14/2018   Procedure: RIGHT HIP HETEROTOPIC OSSIFICATION RESECTION;  Surgeon: Shona Needles, MD;  Location: [place];  Service: Orthopedics;  Laterality: Right;  . LAPAROSCOPY      Current Outpatient Medications  Medication Sig Dispense Refill Last Dose  . Accu-Chek FastClix Lancets MISC 1 each by Does not apply route 2 (two) times a day. Use Accu Chek Fastclix lancets to check blood sugar twice daily. 100 each 3   . albuterol (VENTOLIN HFA) 108 (90 Base) MCG/ACT inhaler Inhale 1-2 puffs into the lungs every 4 (four)  hours as needed for wheezing or shortness of breath.     Marland Kitchen amLODipine (NORVASC) 5 MG tablet Take 5 mg by mouth daily.     Marland Kitchen aspirin 325 MG tablet Take 1 tablet (325 mg total) by mouth daily. 30 tablet 0   . atorvastatin (LIPITOR) 10 MG tablet Take 1 tablet (10 mg total) by mouth daily at 6 PM. 90 tablet 1   . Blood Glucose Monitoring Suppl (ACCU-CHEK GUIDE) w/Device KIT 1 each by Does not apply route 2 (two) times a day. Use as instructed to check blood sugar twice daily. 1 kit 0   . diphenhydramine-acetaminophen (TYLENOL PM) 25-500 MG TABS tablet Take 2 tablets by mouth at bedtime.     Marland Kitchen FARXIGA 5 MG TABS tablet TAKE 1 TABLET BY MOUTH EVERY DAY 90 tablet 2   . fluticasone (FLONASE) 50 MCG/ACT nasal spray Place 1 spray into both nostrils daily as needed for allergies or rhinitis.     Marland Kitchen glucose blood (ACCU-CHEK GUIDE) test strip Use Accu Chek Guide test strips as instructed to check blood sugar twice daily. 100 each 3   . HYDROcodone-acetaminophen (NORCO/VICODIN) 5-325 MG tablet Take 1 tablet by mouth every 6 (six) hours as needed for severe pain. 20 tablet 0   . Insulin Pen Needle (PEN NEEDLES) 30G X 5 MM MISC 1 each by Does not apply route 2 (two) times daily. Use to inject insulin twice daily. 100 each 2   . metFORMIN (GLUCOPHAGE-XR) 500 MG 24 hr tablet Take 2 tablets (1,000 mg total) by mouth daily with  supper. 180 tablet 0   . methocarbamol (ROBAXIN) 750 MG tablet Take 1 tablet (750 mg total) by mouth every 6 (six) hours as needed for muscle spasms. 28 tablet 0   . Multiple Vitamins-Minerals (MULTIVITAMIN WITH MINERALS) tablet Take 1 tablet by mouth every morning. Patient takes Flinstones complete with Vitamin D     . NOVOLOG MIX 70/30 FLEXPEN (70-30) 100 UNIT/ML FlexPen INJECT (16-18 UNITS TOTAL) INTO THE SKIN SEE ADMIN INSTRUCTIONS. 18 UNITS IN THE MORNING AND 16 UNITS AT NIGHT 15 mL 2   . ramipril (ALTACE) 10 MG capsule Take 10 mg by mouth daily.     . Semaglutide, 1 MG/DOSE, (OZEMPIC, 1  MG/DOSE,) 4 MG/3ML SOPN Inject 1 mg into the skin once a week. 9 mL 0   . topiramate (TOPAMAX) 50 MG tablet Take 50 mg by mouth at bedtime.     . traZODone (DESYREL) 50 MG tablet Take 50 mg by mouth at bedtime as needed for sleep.      No current facility-administered medications for this visit.   Allergies  Allergen Reactions  . Liraglutide Other (See Comments)    Other reaction(s): Pancreatitis  *Victoza     Social History   Tobacco Use  . Smoking status: Former Smoker    Packs/day: 0.50    Types: Cigarettes    Quit date: 01/23/2013    Years since quitting: 6.4  . Smokeless tobacco: Never Used  Substance Use Topics  . Alcohol use: Yes    Comment: occ    Family History  Problem Relation Age of Onset  . Diabetes Mother   . Hypertension Mother   . Diabetes Maternal Aunt   . Diabetes Paternal Aunt   . Diabetes Maternal Grandmother   . Heart disease Neg Hx     Review of Systems As stated in HPI  Objective:   Vitals: Ht: 5 ft 1.5 in Stated Wt: 192 lbs Stated BMI: 35.7 Pain Scale: 7 Pain Scale Type: Numeric  Clinical exam: Patient is alert and oriented 3. No shortness of breath, chest pain. Ambulation: Antalgic, no assistive devices Inspection: No obvious deformity Heart: Regular rate and rhythm, no rubs, murmurs, or gallops Lungs: Clear auscultation bilaterally Abdomen: Soft and nontender. No loss of bowel and bladder control, no rebound tenderness Bowel sounds 4 Lumbar spine: Significant low back pain especially with extension and rotation. Pain radiates into the right gluteal region and into the lateral and anterior aspect of the thigh. No hip, knee, ankle pain with isolated joint range of motion. No obvious motor deficits. Lumbar MRI: completed on 03/19/19 was reviewed with the patient. It was completed at [facility]; I have independently reviewed the images as well as the radiology report. Mild retrolisthesis at L2-3 with hard disc osteophyte producing  asymmetrical disc space loss right side worse than the left. This is producing moderate to significant foraminal stenosis with compression of the right exiting L2 nerve root. There is also increased lateral recess stenosis affecting the L3 traversing nerve root. Minimal disc bulging without narrowing at L3-4. Small broad disc protrusion at L4-5 but no stenosis or neural compression. L5-S1: Small disc extrusion which contacts but does not displace the traversing S1 nerve root. No foraminal stenosis. Patient did note some significant, although temporary relief following a selective nerve root block at L2-3 And L3-4  Assessment:   Veronica Fletcher returns because of ongoing severe debilitating pain. We recently requested a injection but this was denied by her insurance company. At this point she states  her quality-of-life is quite poor despite previous injections, as well as physical therapy. She is been able to return to work on a regular basis and her quality-of-life his deteriorated. She is expressed an interest in moving forward with surgery for her back.   Plan:   At this point having failed appropriate conservative management I do think it is reasonable to move forward with the L2-3 fusion. Patient has signs and symptoms of right radicular leg pain consistent with stenosis. Imaging studies confirm moderate foraminal stenosis on the right side at L2-3 causing irritation to the L2 nerve root as well as the dorsal root ganglion and mild to moderate lateral recess stenosis affecting the traversing L3 nerve root. There is mild degenerative changes throughout the remainder of the spine. At this point time based on her primary complaint of significant back buttock and anterior and lateral thigh pain I am recommending the lateral L2-3 fusion with supplemental posterior pedicle screw fixation.   OLIF/XLIF risks, benefits of surgery were reviewed with the patient. These include: infection, bleeding, death, stroke,  paralysis, ongoing or worse pain, need for additional surgery, injury to the lumbar plexus resulting in hip flexor weakness and difficulty walking without assistive devices. Adjacent segment degenerative disease, need for additional surgery including fusing other levels, leak of spinal fluid, Nonunion, hardware failure, breakage, or mal-position. Deep venous thrombosis (DVT) requiring additional treatment such as filter, and/or medications. Injury to abdominal contents, loss in bowel and bladder control.  Risks and benefits of spinal fusion: Infection, bleeding, death, stroke, paralysis, ongoing or worse pain, need for additional surgery, nonunion, leak of spinal fluid, adjacent segment degeneration requiring additional fusion surgery, Injury to abdominal vessels that can require anterior surgery to stop bleeding. Malposition of the cage and/or pedicle screws that could require additional surgery. Loss of bowel and bladder control. Postoperative hematoma causing neurologic compression that could require urgent or emergent re-operation.  We have obtained preoperative medical clearance from the patient's primary care provider who indicated it was acceptable for the patient to hold aspirin 7 days prior to surgery and restart 48 hours after surgery. We have also received preoperative medical clearance with the endocrinologist to manage the patient's diabetes.  I reviewed the patient's medication list with her. She is not on any anti-inflammatory medicines or blood thinners. She does take aspirin but she will hold prior to surgery as mentioned previously. I also advised her to hold her vitamins and supplements.  Patient is scheduled following this visit to meet with the physical therapist to be fitted for her LSO brace.  We have also discussed the post-operative recovery period to include: bathing/showering restrictions, wound healing, activity (and driving) restrictions, medications/pain mangement.  We have  also discussed post-operative redflags to include: signs and symptoms of postoperative infection, DVT/PE.  All patient's questions were answered  Follow-up: 2 weeks postoperatively

## 2019-07-01 ENCOUNTER — Other Ambulatory Visit: Payer: Self-pay | Admitting: Endocrinology

## 2019-07-04 ENCOUNTER — Ambulatory Visit: Payer: Self-pay | Admitting: Orthopedic Surgery

## 2019-07-04 NOTE — Progress Notes (Signed)
[id number] [address], [place] [address] Phone: [phone] Fax: [phone]      Your procedure is scheduled on Thursday 07/07/2019.  Report to [facility] Main Entrance "A" at 10:30 A.M., and check in at the Admitting office.  Call this number if you have problems the morning of surgery:  [phone]  Call [phone] if you have any questions prior to your surgery date Monday-Friday 8am-4pm    Remember:  Do not eat or drink after midnight the night before your surgery    Take these medicines the morning of surgery with A SIP OF WATER: Amlodipine (Norvasc)  If needed: Albuterol (Ventolin) inhaler Fluticasone (Flonase)   As of today, STOP taking any Aspirin (unless otherwise instructed by your surgeon) and Aspirin containing products, Aleve, Naproxen, Ibuprofen, Motrin, Advil, Goody's, BC's, all herbal medications, fish oil, and all vitamins. - including Celecoxib (Celebrex), Diphenhydramine-APAP (Goody PM), and Turmeric   WHAT DO I DO ABOUT MY DIABETES MEDICATION?   . HOLD your Farxiga the [place] surgery and the DAY OF surgery.  . DO NOT take your Metformin (Glucophage XR) the DAY OF Surgery  . THE NIGHT BEFORE SURGERY, take 13 units of Novolog 70/30 insulin.     . THE MORNING OF SURGERY, DO NOT take your Novolog 70/30 insulin.    HOW TO MANAGE YOUR DIABETES BEFORE AND AFTER SURGERY  Why is it important to control my blood sugar before and after surgery? . Improving blood sugar levels before and after surgery helps healing and can limit problems. . A way of improving blood sugar control is eating a healthy diet by: o  Eating less sugar and carbohydrates o  Increasing activity/exercise o  Talking with your doctor about reaching your blood sugar goals . High blood sugars (greater than 180 mg/dL) can raise your risk of infections and slow your recovery, so you will  need to focus on controlling your diabetes during the weeks before surgery. . Make sure that the doctor who takes care of your diabetes knows about your planned surgery including the date and location.  How do I manage my blood sugar before surgery? . Check your blood sugar at least 4 times a day, starting 2 days before surgery, to make sure that the level is not too high or low. . Check your blood sugar the morning of your surgery when you wake up and every 2 hours until you get to the Short Stay unit. o If your blood sugar is less than 70 mg/dL, you will need to treat for low blood sugar: - Do not take insulin. - Treat a low blood sugar (less than 70 mg/dL) with  cup of clear juice (cranberry or apple), 4 glucose tablets, OR glucose gel. - Recheck blood sugar in 15 minutes after treatment (to make sure it is greater than 70 mg/dL). If your blood sugar is not greater than 70 mg/dL on recheck, call [phone] for further instructions. . Report your blood sugar to the short stay nurse when you get to Short Stay.  . If you are admitted to the hospital after surgery: o Your blood sugar will be checked by the staff and you will probably be given insulin after surgery (instead of oral diabetes medicines) to make sure you have good blood sugar levels. o The goal for blood sugar control after surgery is 80-180 mg/dL.  Do not wear jewelry, make up, or nail polish            Do not wear lotions, powders, perfumes, or deodorant.            Do not shave 48 hours prior to surgery.  Men may shave face and neck.            Do not bring valuables to the hospital.            [facility] is not responsible for any belongings or valuables.  Do NOT Smoke (Tobacco/Vapping) or drink Alcohol 24 hours prior to your procedure  If you use a CPAP at night, you may bring all equipment for your overnight stay.   Contacts, glasses, dentures or bridgework may not be worn into surgery.       For patients admitted to the hospital, discharge time will be determined by your treatment team.   Patients discharged the day of surgery will not be allowed to drive home, and someone needs to stay with them for 24 hours.    Special instructions:   [place]- Preparing For Surgery  Before surgery, you can play an important role. Because skin is not sterile, your skin needs to be as free of germs as possible. You can reduce the number of germs on your skin by washing with CHG (chlorahexidine gluconate) Soap before surgery.  CHG is an antiseptic cleaner which kills germs and bonds with the skin to continue killing germs even after washing.    Oral Hygiene is also important to reduce your risk of infection.  Remember - BRUSH YOUR TEETH THE MORNING OF SURGERY WITH YOUR REGULAR TOOTHPASTE  Please do not use if you have an allergy to CHG or antibacterial soaps. If your skin becomes reddened/irritated stop using the CHG.  Do not shave (including legs and underarms) for at least 48 hours prior to first CHG shower. It is OK to shave your face.  Please follow these instructions carefully.   1. Shower the NIGHT BEFORE SURGERY and the MORNING OF SURGERY with CHG Soap.   2. If you chose to wash your hair, wash your hair first as usual with your normal shampoo.  3. After you shampoo, rinse your hair and body thoroughly to remove the shampoo.  4. Use CHG as you would any other liquid soap. You can apply CHG directly to the skin and wash gently with a scrungie or a clean washcloth.   5. Apply the CHG Soap to your body ONLY FROM THE NECK DOWN.  Do not use on open wounds or open sores. Avoid contact with your eyes, ears, mouth and genitals (private parts). Wash Face and genitals (private parts)  with your normal soap.   6. Wash thoroughly, paying special attention to the area where your surgery will be performed.  7. Thoroughly rinse your body with warm water from the neck down.  8. DO NOT  shower/wash with your normal soap after using and rinsing off the CHG Soap.  9. Pat yourself dry with a CLEAN TOWEL.  10. Wear CLEAN PAJAMAS to bed the night before surgery, wear comfortable clothes the morning of surgery  11. Place CLEAN SHEETS on your bed the night of your first shower and DO NOT SLEEP WITH PETS.   Day of Surgery: Shower as directed with CHG soap Do not apply any deodorants/lotions.  Please wear clean clothes to the hospital/surgery center.   Remember to brush your teeth WITH YOUR REGULAR  TOOTHPASTE.   Please read over the following fact sheets that you were given.

## 2019-07-05 ENCOUNTER — Other Ambulatory Visit (HOSPITAL_COMMUNITY)
Admission: RE | Admit: 2019-07-05 | Discharge: 2019-07-05 | Disposition: A | Discharge status: Home / Self Care | Payer: BC Managed Care – PPO | Source: Ambulatory Visit | Attending: Orthopedic Surgery | Admitting: Orthopedic Surgery

## 2019-07-05 ENCOUNTER — Ambulatory Visit (HOSPITAL_COMMUNITY)
Admission: RE | Admit: 2019-07-05 | Discharge: 2019-07-05 | Disposition: A | Discharge status: Home / Self Care | Payer: BC Managed Care – PPO | Source: Ambulatory Visit | Attending: Orthopedic Surgery | Admitting: Orthopedic Surgery

## 2019-07-05 ENCOUNTER — Encounter (HOSPITAL_COMMUNITY): Payer: Self-pay

## 2019-07-05 ENCOUNTER — Encounter (HOSPITAL_COMMUNITY)
Admission: RE | Admit: 2019-07-05 | Discharge: 2019-07-05 | Disposition: A | Discharge status: Home / Self Care | Payer: BC Managed Care – PPO | Source: Ambulatory Visit | Attending: Orthopedic Surgery | Admitting: Orthopedic Surgery

## 2019-07-05 ENCOUNTER — Other Ambulatory Visit: Payer: Self-pay

## 2019-07-05 DIAGNOSIS — Z888 Allergy status to other drugs, medicaments and biological substances status: Secondary | ICD-10-CM | POA: Diagnosis not present

## 2019-07-05 DIAGNOSIS — Z79899 Other long term (current) drug therapy: Secondary | ICD-10-CM | POA: Diagnosis not present

## 2019-07-05 DIAGNOSIS — Z189 Retained foreign body fragments, unspecified material: Secondary | ICD-10-CM | POA: Diagnosis not present

## 2019-07-05 DIAGNOSIS — M48061 Spinal stenosis, lumbar region without neurogenic claudication: Secondary | ICD-10-CM | POA: Diagnosis not present

## 2019-07-05 DIAGNOSIS — R110 Nausea: Secondary | ICD-10-CM | POA: Diagnosis not present

## 2019-07-05 DIAGNOSIS — E785 Hyperlipidemia, unspecified: Secondary | ICD-10-CM | POA: Diagnosis not present

## 2019-07-05 DIAGNOSIS — M5116 Intervertebral disc disorders with radiculopathy, lumbar region: Secondary | ICD-10-CM | POA: Diagnosis not present

## 2019-07-05 DIAGNOSIS — M4326 Fusion of spine, lumbar region: Secondary | ICD-10-CM | POA: Diagnosis not present

## 2019-07-05 DIAGNOSIS — Z90710 Acquired absence of both cervix and uterus: Secondary | ICD-10-CM | POA: Diagnosis not present

## 2019-07-05 DIAGNOSIS — Z87891 Personal history of nicotine dependence: Secondary | ICD-10-CM | POA: Diagnosis not present

## 2019-07-05 DIAGNOSIS — Z794 Long term (current) use of insulin: Secondary | ICD-10-CM | POA: Diagnosis not present

## 2019-07-05 DIAGNOSIS — J45909 Unspecified asthma, uncomplicated: Secondary | ICD-10-CM | POA: Diagnosis present

## 2019-07-05 DIAGNOSIS — Z01818 Encounter for other preprocedural examination: Secondary | ICD-10-CM

## 2019-07-05 DIAGNOSIS — I10 Essential (primary) hypertension: Secondary | ICD-10-CM | POA: Diagnosis not present

## 2019-07-05 DIAGNOSIS — M5136 Other intervertebral disc degeneration, lumbar region: Secondary | ICD-10-CM | POA: Diagnosis not present

## 2019-07-05 DIAGNOSIS — Z9049 Acquired absence of other specified parts of digestive tract: Secondary | ICD-10-CM | POA: Diagnosis not present

## 2019-07-05 DIAGNOSIS — Z8249 Family history of ischemic heart disease and other diseases of the circulatory system: Secondary | ICD-10-CM | POA: Diagnosis not present

## 2019-07-05 DIAGNOSIS — M5126 Other intervertebral disc displacement, lumbar region: Secondary | ICD-10-CM | POA: Diagnosis not present

## 2019-07-05 DIAGNOSIS — E119 Type 2 diabetes mellitus without complications: Secondary | ICD-10-CM | POA: Diagnosis not present

## 2019-07-05 DIAGNOSIS — Z833 Family history of diabetes mellitus: Secondary | ICD-10-CM | POA: Diagnosis not present

## 2019-07-05 DIAGNOSIS — M48062 Spinal stenosis, lumbar region with neurogenic claudication: Secondary | ICD-10-CM | POA: Diagnosis not present

## 2019-07-05 DIAGNOSIS — Z7982 Long term (current) use of aspirin: Secondary | ICD-10-CM | POA: Diagnosis not present

## 2019-07-05 DIAGNOSIS — Z20822 Contact with and (suspected) exposure to covid-19: Secondary | ICD-10-CM | POA: Diagnosis not present

## 2019-07-05 DIAGNOSIS — M4802 Spinal stenosis, cervical region: Secondary | ICD-10-CM | POA: Diagnosis not present

## 2019-07-05 HISTORY — DX: Family history of other specified conditions: Z84.89

## 2019-07-05 HISTORY — DX: Anemia, unspecified: D64.9

## 2019-07-05 LAB — BASIC METABOLIC PANEL
Anion gap: 11 (ref 5–15)
BUN: 10 mg/dL (ref 6–20)
CO2: 25 mmol/L (ref 22–32)
Calcium: 9.7 mg/dL (ref 8.9–10.3)
Chloride: 104 mmol/L (ref 98–111)
Creatinine, Ser: 0.69 mg/dL (ref 0.44–1.00)
GFR calc Af Amer: >60 mL/min (ref >60)
GFR calc non Af Amer: >60 mL/min (ref >60)
Glucose, Bld: 117 mg/dL — ABNORMAL HIGH (ref 70–99)
Potassium: 3.7 mmol/L (ref 3.5–5.1)
Sodium: 140 mmol/L (ref 135–145)

## 2019-07-05 LAB — URINALYSIS, ROUTINE W REFLEX MICROSCOPIC
Bilirubin Urine: NEGATIVE
Glucose, UA: NEGATIVE mg/dL
Ketones, ur: NEGATIVE mg/dL
Leukocytes,Ua: NEGATIVE
Nitrite: NEGATIVE
Protein, ur: NEGATIVE mg/dL
Specific Gravity, Urine: 1.014 (ref 1.005–1.030)
pH: 5 (ref 5.0–8.0)

## 2019-07-05 LAB — APTT: aPTT: 31 seconds (ref 24–36)

## 2019-07-05 LAB — SARS CORONAVIRUS 2 (TAT 6-24 HRS): SARS Coronavirus 2: NEGATIVE

## 2019-07-05 LAB — CBC
HCT: 45.6 % (ref 36.0–46.0)
Hemoglobin: 13.9 g/dL (ref 12.0–15.0)
MCH: 28.7 pg (ref 26.0–34.0)
MCHC: 30.5 g/dL (ref 30.0–36.0)
MCV: 94 fL (ref 80.0–100.0)
Platelets: 178 10*3/uL (ref 150–400)
RBC: 4.85 MIL/uL (ref 3.87–5.11)
RDW: 13.5 % (ref 11.5–15.5)
WBC: 8 10*3/uL (ref 4.0–10.5)
nRBC: 0 % (ref 0.0–0.2)

## 2019-07-05 LAB — TYPE AND SCREEN
ABO/RH(D): AB POS
Antibody Screen: NEGATIVE

## 2019-07-05 LAB — ABO/RH: ABO/RH(D): AB POS

## 2019-07-05 LAB — GLUCOSE, CAPILLARY: Glucose-Capillary: 108 mg/dL — ABNORMAL HIGH (ref 70–99)

## 2019-07-05 LAB — PROTIME-INR
INR: 0.9 (ref 0.8–1.2)
Prothrombin Time: 12.2 seconds (ref 11.4–15.2)

## 2019-07-05 LAB — SURGICAL PCR SCREEN
MRSA, PCR: NEGATIVE
Staphylococcus aureus: NEGATIVE

## 2019-07-05 NOTE — Progress Notes (Signed)
Anesthesia Chart Review:   Case: [id number] Date/Time: 07/07/19 1220   Procedure: ANTERIOR LATERAL LUMBAR FUSION (XLIF) L2-3, POSTERIOR SPINAL FUSION INTERBODY L2-3 (N/A ) - 4 HRS   Anesthesia type: General   Pre-op diagnosis: L2-3 degnerative disc disease with radicular pain   Location: [facility] OR ROOM 04 / [place] OR   Surgeons: Melina Schools, MD      DISCUSSION:  Pt is a 54 year old with hx HTN, DM, anemia, asthma   VS: BP (!) 142/74   Pulse 95   Temp 37.1 C (Oral)   Resp 18   Ht _0  (1.575 m)   Wt 82.7 kg   LMP 07/09/2011   SpO2 100%   BMI 33.34 kg/m    PROVIDERS: - PCP is Lawerance Cruel, MD   LABS: Labs reviewed: Acceptable for surgery. (all labs ordered are listed, but only abnormal results are displayed)  Labs Reviewed  GLUCOSE, CAPILLARY - Abnormal; Notable for the following components:      Result Value   Glucose-Capillary 108 (*)    All other components within normal limits  BASIC METABOLIC PANEL - Abnormal; Notable for the following components:   Glucose, Bld 117 (*)    All other components within normal limits  URINALYSIS, ROUTINE W REFLEX MICROSCOPIC - Abnormal; Notable for the following components:   Hgb urine dipstick SMALL (*)    Bacteria, UA RARE (*)    All other components within normal limits  SURGICAL PCR SCREEN  APTT  CBC  PROTIME-INR  TYPE AND SCREEN  ABO/RH     IMAGES:   CXR 07/05/19: No acute disease.   EKG 10/12/18: NSR. Biatrial enlargement   CV: N/A   Past Medical History:  Diagnosis Date  . Anemia   . Asthma   . Diabetes mellitus   . Family history of adverse reaction to anesthesia    Brother  . Heart murmur   . Hyperlipidemia   . Hypertension   . Pancreatitis   . Pneumonia     Past Surgical History:  Procedure Laterality Date  . ABDOMINAL HYSTERECTOMY    . CESAREAN SECTION    . CHOLECYSTECTOMY    . GANGLION CYST EXCISION    . HIP CAPSULECTOMY Right 10/14/2018   Procedure: RIGHT HIP HETEROTOPIC OSSIFICATION  RESECTION;  Surgeon: Shona Needles, MD;  Location: [place];  Service: Orthopedics;  Laterality: Right;  . LAPAROSCOPY      MEDICATIONS: . Accu-Chek FastClix Lancets MISC  . albuterol (VENTOLIN HFA) 108 (90 Base) MCG/ACT inhaler  . amLODipine (NORVASC) 5 MG tablet  . Apple Cider Vinegar 500 MG TABS  . aspirin EC 81 MG tablet  . atorvastatin (LIPITOR) 10 MG tablet  . Blood Glucose Monitoring Suppl (ACCU-CHEK GUIDE) w/Device KIT  . celecoxib (CELEBREX) 200 MG capsule  . diphenhydramine-acetaminophen (TYLENOL PM) 25-500 MG TABS tablet  . diphenhydrAMINE-APAP, sleep, (GOODY PM PO)  . FARXIGA 5 MG TABS tablet  . fluticasone (FLONASE) 50 MCG/ACT nasal spray  . gabapentin (NEURONTIN) 300 MG capsule  . glucose blood (ACCU-CHEK GUIDE) test strip  . Insulin Pen Needle (PEN NEEDLES) 30G X 5 MM MISC  . Menthol, Topical Analgesic, (BIOFREEZE ROLL-ON) 4 % GEL  . metFORMIN (GLUCOPHAGE-XR) 500 MG 24 hr tablet  . Multiple Vitamins-Minerals (MULTIVITAMIN WITH MINERALS) tablet  . naproxen sodium (ALEVE) 220 MG tablet  . NOVOLOG MIX 70/30 FLEXPEN (70-30) 100 UNIT/ML FlexPen  . ramipril (ALTACE) 10 MG capsule  . Semaglutide, 1 MG/DOSE, (OZEMPIC, 1 MG/DOSE,) 4 MG/3ML  SOPN  . topiramate (TOPAMAX) 50 MG tablet  . traZODone (DESYREL) 50 MG tablet  . TURMERIC PO   No current facility-administered medications for this encounter.    If no changes, I anticipate pt can proceed with surgery as scheduled.   Willeen Cass, FNP-BC [facility]/Anesthesiology Phone: [phone] 07/06/2019 2:17 PM

## 2019-07-05 NOTE — Progress Notes (Signed)
PCP - Dr. Melinda Crutch [facility] [address]  Cardiologist - Denies  Chest x-ray - Not indicated EKG - 10/12/18 Stress Test - Per patient "It's been a while 1990's" ECHO - Yes has a murmur was done as a teenager Cardiac Cath - Denies  Sleep Study - Denies  Fasting Blood Sugar - Today at PAT appt 108 cbg Averages fasting 80's-130's Checks Blood Sugar ___2__ times a day  Aspirin Instructions: Has already stopped per surgeon's request  COVID TEST- 07/05/19  Anesthesia review: Yes per surgeon request  Patient denies shortness of breath, fever, cough and chest pain at PAT appointment   All instructions explained to the patient, with a verbal understanding of the material. Patient agrees to go over the instructions while at home for a better understanding. Patient also instructed to self quarantine after being tested for COVID-19. The opportunity to ask questions was provided.

## 2019-07-07 ENCOUNTER — Inpatient Hospital Stay (HOSPITAL_COMMUNITY)
Admission: AD | Disposition: A | Discharge status: Home / Self Care | Payer: Self-pay | Source: Home / Self Care | Attending: Orthopedic Surgery

## 2019-07-07 ENCOUNTER — Other Ambulatory Visit: Payer: Self-pay

## 2019-07-07 ENCOUNTER — Ambulatory Visit (HOSPITAL_COMMUNITY): Payer: BC Managed Care – PPO | Admitting: Emergency Medicine

## 2019-07-07 ENCOUNTER — Ambulatory Visit (HOSPITAL_COMMUNITY): Payer: BC Managed Care – PPO

## 2019-07-07 ENCOUNTER — Ambulatory Visit (HOSPITAL_COMMUNITY): Payer: BC Managed Care – PPO | Admitting: Certified Registered Nurse Anesthetist

## 2019-07-07 ENCOUNTER — Encounter (HOSPITAL_COMMUNITY): Payer: Self-pay | Admitting: Orthopedic Surgery

## 2019-07-07 ENCOUNTER — Inpatient Hospital Stay (HOSPITAL_COMMUNITY)
Admission: AD | Admit: 2019-07-07 | Discharge: 2019-07-10 | DRG: 455 | Disposition: A | Discharge status: Home / Self Care | Payer: BC Managed Care – PPO | Attending: Orthopedic Surgery | Admitting: Orthopedic Surgery

## 2019-07-07 DIAGNOSIS — M5126 Other intervertebral disc displacement, lumbar region: Secondary | ICD-10-CM | POA: Diagnosis present

## 2019-07-07 DIAGNOSIS — Z981 Arthrodesis status: Secondary | ICD-10-CM

## 2019-07-07 DIAGNOSIS — Z833 Family history of diabetes mellitus: Secondary | ICD-10-CM | POA: Diagnosis not present

## 2019-07-07 DIAGNOSIS — Z419 Encounter for procedure for purposes other than remedying health state, unspecified: Secondary | ICD-10-CM

## 2019-07-07 DIAGNOSIS — Z9049 Acquired absence of other specified parts of digestive tract: Secondary | ICD-10-CM | POA: Diagnosis not present

## 2019-07-07 DIAGNOSIS — M48062 Spinal stenosis, lumbar region with neurogenic claudication: Secondary | ICD-10-CM | POA: Diagnosis present

## 2019-07-07 DIAGNOSIS — Z20822 Contact with and (suspected) exposure to covid-19: Secondary | ICD-10-CM | POA: Diagnosis present

## 2019-07-07 DIAGNOSIS — M4326 Fusion of spine, lumbar region: Secondary | ICD-10-CM | POA: Diagnosis not present

## 2019-07-07 DIAGNOSIS — R110 Nausea: Secondary | ICD-10-CM | POA: Diagnosis not present

## 2019-07-07 DIAGNOSIS — M5116 Intervertebral disc disorders with radiculopathy, lumbar region: Principal | ICD-10-CM | POA: Diagnosis present

## 2019-07-07 DIAGNOSIS — Z90710 Acquired absence of both cervix and uterus: Secondary | ICD-10-CM | POA: Diagnosis not present

## 2019-07-07 DIAGNOSIS — M5136 Other intervertebral disc degeneration, lumbar region: Secondary | ICD-10-CM | POA: Diagnosis not present

## 2019-07-07 DIAGNOSIS — Z888 Allergy status to other drugs, medicaments and biological substances status: Secondary | ICD-10-CM | POA: Diagnosis not present

## 2019-07-07 DIAGNOSIS — I10 Essential (primary) hypertension: Secondary | ICD-10-CM | POA: Diagnosis present

## 2019-07-07 DIAGNOSIS — Z7982 Long term (current) use of aspirin: Secondary | ICD-10-CM | POA: Diagnosis not present

## 2019-07-07 DIAGNOSIS — E119 Type 2 diabetes mellitus without complications: Secondary | ICD-10-CM | POA: Diagnosis present

## 2019-07-07 DIAGNOSIS — J45909 Unspecified asthma, uncomplicated: Secondary | ICD-10-CM | POA: Diagnosis present

## 2019-07-07 DIAGNOSIS — M4802 Spinal stenosis, cervical region: Secondary | ICD-10-CM | POA: Diagnosis present

## 2019-07-07 DIAGNOSIS — Z79899 Other long term (current) drug therapy: Secondary | ICD-10-CM | POA: Diagnosis not present

## 2019-07-07 DIAGNOSIS — E785 Hyperlipidemia, unspecified: Secondary | ICD-10-CM | POA: Diagnosis present

## 2019-07-07 DIAGNOSIS — Z794 Long term (current) use of insulin: Secondary | ICD-10-CM

## 2019-07-07 DIAGNOSIS — Z87891 Personal history of nicotine dependence: Secondary | ICD-10-CM | POA: Diagnosis not present

## 2019-07-07 DIAGNOSIS — M48061 Spinal stenosis, lumbar region without neurogenic claudication: Secondary | ICD-10-CM | POA: Diagnosis not present

## 2019-07-07 DIAGNOSIS — Z189 Retained foreign body fragments, unspecified material: Secondary | ICD-10-CM | POA: Diagnosis not present

## 2019-07-07 DIAGNOSIS — Z8249 Family history of ischemic heart disease and other diseases of the circulatory system: Secondary | ICD-10-CM | POA: Diagnosis not present

## 2019-07-07 HISTORY — PX: ANTERIOR LAT LUMBAR FUSION: SHX1168

## 2019-07-07 LAB — HEMOGLOBIN A1C
Hgb A1c MFr Bld: 6.5 % — ABNORMAL HIGH (ref 4.8–5.6)
Mean Plasma Glucose: 139.85 mg/dL

## 2019-07-07 LAB — GLUCOSE, CAPILLARY
Glucose-Capillary: 146 mg/dL — ABNORMAL HIGH (ref 70–99)
Glucose-Capillary: 113 mg/dL — ABNORMAL HIGH (ref 70–99)
Glucose-Capillary: 173 mg/dL — ABNORMAL HIGH (ref 70–99)
Glucose-Capillary: 263 mg/dL — ABNORMAL HIGH (ref 70–99)

## 2019-07-07 SURGERY — ANTERIOR LATERAL LUMBAR FUSION 1 LEVEL
Anesthesia: General | Site: Spine Lumbar

## 2019-07-07 MED ORDER — DAPAGLIFLOZIN PROPANEDIOL 5 MG PO TABS
5.0000 mg | ORAL_TABLET | Freq: Every day | ORAL | Status: DC
  Administered 2019-07-08 – 2019-07-10 (×3): 5 mg via ORAL
  Filled 2019-07-07 (×3): qty 1

## 2019-07-07 MED ORDER — THROMBIN (RECOMBINANT) 20000 UNITS EX SOLR
CUTANEOUS | Status: AC
  Filled 2019-07-07: qty 20000

## 2019-07-07 MED ORDER — ACETAMINOPHEN 650 MG RE SUPP: 650.0000 mg | RECTAL | Status: DC | PRN

## 2019-07-07 MED ORDER — PROPOFOL 1000 MG/100ML IV EMUL
INTRAVENOUS | Status: AC
  Filled 2019-07-07: qty 200

## 2019-07-07 MED ORDER — CHLORHEXIDINE GLUCONATE 0.12 % MT SOLN
OROMUCOSAL | Status: AC
  Administered 2019-07-07: 15 mL via OROMUCOSAL
  Filled 2019-07-07: qty 15

## 2019-07-07 MED ORDER — MENTHOL 3 MG MT LOZG: 1.0000 | LOZENGE | OROMUCOSAL | Status: DC | PRN

## 2019-07-07 MED ORDER — METFORMIN HCL ER 500 MG PO TB24
1000.0000 mg | ORAL_TABLET | Freq: Every day | ORAL | Status: DC
  Administered 2019-07-08 – 2019-07-09 (×2): 1000 mg via ORAL
  Filled 2019-07-07 (×2): qty 2

## 2019-07-07 MED ORDER — CEFAZOLIN SODIUM 1 G IJ SOLR
INTRAMUSCULAR | Status: AC
  Filled 2019-07-07: qty 20

## 2019-07-07 MED ORDER — ROCURONIUM BROMIDE 10 MG/ML (PF) SYRINGE
PREFILLED_SYRINGE | INTRAVENOUS | Status: AC
  Filled 2019-07-07: qty 30

## 2019-07-07 MED ORDER — PHENYLEPHRINE 40 MCG/ML (10ML) SYRINGE FOR IV PUSH (FOR BLOOD PRESSURE SUPPORT)
PREFILLED_SYRINGE | INTRAVENOUS | Status: AC
  Filled 2019-07-07: qty 10

## 2019-07-07 MED ORDER — FENTANYL CITRATE (PF) 100 MCG/2ML IJ SOLN
INTRAMUSCULAR | Status: DC | PRN
  Administered 2019-07-07 (×2): 50 ug via INTRAVENOUS
  Administered 2019-07-07: 100 ug via INTRAVENOUS
  Administered 2019-07-07: 50 ug via INTRAVENOUS
  Administered 2019-07-07: 100 ug via INTRAVENOUS
  Administered 2019-07-07 (×3): 50 ug via INTRAVENOUS

## 2019-07-07 MED ORDER — PROPOFOL 10 MG/ML IV BOLUS
INTRAVENOUS | Status: DC | PRN
  Administered 2019-07-07: 150 mg via INTRAVENOUS

## 2019-07-07 MED ORDER — ALBUMIN HUMAN 5 % IV SOLN
INTRAVENOUS | Status: DC | PRN
Start: 2019-07-07 — End: 2019-07-07

## 2019-07-07 MED ORDER — MIDAZOLAM HCL 2 MG/2ML IJ SOLN
INTRAMUSCULAR | Status: AC
  Filled 2019-07-07: qty 2

## 2019-07-07 MED ORDER — EPINEPHRINE PF 1 MG/ML IJ SOLN
INTRAMUSCULAR | Status: DC | PRN
  Administered 2019-07-07: 1 mg

## 2019-07-07 MED ORDER — INSULIN ASPART PROT & ASPART (70-30 MIX) 100 UNIT/ML ~~LOC~~ SUSP
16.0000 [IU] | Freq: Every day | SUBCUTANEOUS | Status: DC
  Administered 2019-07-08 – 2019-07-09 (×2): 16 [IU] via SUBCUTANEOUS
  Filled 2019-07-07: qty 10

## 2019-07-07 MED ORDER — RAMIPRIL 5 MG PO CAPS
10.0000 mg | ORAL_CAPSULE | Freq: Every day | ORAL | Status: DC
  Administered 2019-07-07 – 2019-07-10 (×4): 10 mg via ORAL
  Filled 2019-07-07 (×3): qty 1
  Filled 2019-07-07: qty 2

## 2019-07-07 MED ORDER — SUCCINYLCHOLINE CHLORIDE 20 MG/ML IJ SOLN
INTRAMUSCULAR | Status: DC | PRN
  Administered 2019-07-07: 100 mg via INTRAVENOUS

## 2019-07-07 MED ORDER — INSULIN ASPART PROT & ASPART (70-30 MIX) 100 UNIT/ML PEN: 16.0000 [IU] | PEN_INJECTOR | SUBCUTANEOUS | Status: DC

## 2019-07-07 MED ORDER — MIDAZOLAM HCL 5 MG/5ML IJ SOLN
INTRAMUSCULAR | Status: DC | PRN
  Administered 2019-07-07: 2 mg via INTRAVENOUS

## 2019-07-07 MED ORDER — INSULIN ASPART PROT & ASPART (70-30 MIX) 100 UNIT/ML ~~LOC~~ SUSP
18.0000 [IU] | Freq: Every day | SUBCUTANEOUS | Status: DC
  Administered 2019-07-08 – 2019-07-10 (×3): 18 [IU] via SUBCUTANEOUS
  Filled 2019-07-07: qty 10

## 2019-07-07 MED ORDER — METHOCARBAMOL 1000 MG/10ML IJ SOLN
500.0000 mg | Freq: Four times a day (QID) | INTRAVENOUS | Status: DC | PRN
  Filled 2019-07-07: qty 5

## 2019-07-07 MED ORDER — PHENOL 1.4 % MT LIQD: 1.0000 | OROMUCOSAL | Status: DC | PRN

## 2019-07-07 MED ORDER — PROPOFOL 10 MG/ML IV BOLUS
INTRAVENOUS | Status: AC
  Filled 2019-07-07: qty 20

## 2019-07-07 MED ORDER — DEXAMETHASONE SODIUM PHOSPHATE 10 MG/ML IJ SOLN
INTRAMUSCULAR | Status: DC | PRN
  Administered 2019-07-07: 5 mg via INTRAVENOUS

## 2019-07-07 MED ORDER — FLEET ENEMA 7-19 GM/118ML RE ENEM: 1.0000 | ENEMA | Freq: Once | RECTAL | Status: DC | PRN

## 2019-07-07 MED ORDER — ONDANSETRON HCL 4 MG PO TABS: 4.0000 mg | ORAL_TABLET | Freq: Three times a day (TID) | ORAL | 0 refills | Status: DC | PRN

## 2019-07-07 MED ORDER — LACTATED RINGERS IV SOLN: INTRAVENOUS | Status: DC

## 2019-07-07 MED ORDER — OXYCODONE HCL 5 MG PO TABS: 5.0000 mg | ORAL_TABLET | ORAL | Status: DC | PRN

## 2019-07-07 MED ORDER — CEFAZOLIN SODIUM-DEXTROSE 1-4 GM/50ML-% IV SOLN
1.0000 g | Freq: Three times a day (TID) | INTRAVENOUS | Status: AC
  Administered 2019-07-08 (×2): 1 g via INTRAVENOUS
  Filled 2019-07-07 (×2): qty 50

## 2019-07-07 MED ORDER — DOCUSATE SODIUM 100 MG PO CAPS
100.0000 mg | ORAL_CAPSULE | Freq: Two times a day (BID) | ORAL | Status: DC
  Administered 2019-07-07 – 2019-07-10 (×6): 100 mg via ORAL
  Filled 2019-07-07 (×6): qty 1

## 2019-07-07 MED ORDER — ORAL CARE MOUTH RINSE: 15.0000 mL | Freq: Once | OROMUCOSAL | Status: AC

## 2019-07-07 MED ORDER — EPINEPHRINE PF 1 MG/ML IJ SOLN
INTRAMUSCULAR | Status: AC
  Filled 2019-07-07: qty 1

## 2019-07-07 MED ORDER — 0.9 % SODIUM CHLORIDE (POUR BTL) OPTIME
TOPICAL | Status: DC | PRN
  Administered 2019-07-07 (×3): 1000 mL

## 2019-07-07 MED ORDER — METHOCARBAMOL 500 MG PO TABS: 500.0000 mg | ORAL_TABLET | Freq: Three times a day (TID) | ORAL | 0 refills | Status: AC | PRN

## 2019-07-07 MED ORDER — CHLORHEXIDINE GLUCONATE 0.12 % MT SOLN: 15.0000 mL | Freq: Once | OROMUCOSAL | Status: AC

## 2019-07-07 MED ORDER — PROMETHAZINE HCL 25 MG/ML IJ SOLN: 6.2500 mg | INTRAMUSCULAR | Status: DC | PRN

## 2019-07-07 MED ORDER — LIDOCAINE 2% (20 MG/ML) 5 ML SYRINGE
INTRAMUSCULAR | Status: AC
  Filled 2019-07-07: qty 5

## 2019-07-07 MED ORDER — OXYCODONE HCL 5 MG PO TABS
10.0000 mg | ORAL_TABLET | ORAL | Status: DC | PRN
  Administered 2019-07-07 – 2019-07-08 (×5): 10 mg via ORAL
  Filled 2019-07-07 (×5): qty 2

## 2019-07-07 MED ORDER — ALBUTEROL SULFATE (2.5 MG/3ML) 0.083% IN NEBU: 2.5000 mg | INHALATION_SOLUTION | RESPIRATORY_TRACT | Status: DC | PRN

## 2019-07-07 MED ORDER — HYDROMORPHONE HCL 1 MG/ML IJ SOLN
1.0000 mg | INTRAMUSCULAR | Status: AC | PRN
  Administered 2019-07-08: 1 mg via INTRAVENOUS
  Filled 2019-07-07: qty 1

## 2019-07-07 MED ORDER — DEXAMETHASONE SODIUM PHOSPHATE 10 MG/ML IJ SOLN
INTRAMUSCULAR | Status: AC
  Filled 2019-07-07: qty 1

## 2019-07-07 MED ORDER — CEFAZOLIN SODIUM-DEXTROSE 2-4 GM/100ML-% IV SOLN
INTRAVENOUS | Status: AC
  Filled 2019-07-07: qty 100

## 2019-07-07 MED ORDER — HYDROMORPHONE HCL 1 MG/ML IJ SOLN
INTRAMUSCULAR | Status: AC
  Filled 2019-07-07: qty 1

## 2019-07-07 MED ORDER — TOPIRAMATE 25 MG PO TABS
25.0000 mg | ORAL_TABLET | Freq: Every day | ORAL | Status: DC
  Administered 2019-07-07 – 2019-07-09 (×3): 25 mg via ORAL
  Filled 2019-07-07 (×3): qty 1

## 2019-07-07 MED ORDER — POLYETHYLENE GLYCOL 3350 17 G PO PACK: 17.0000 g | PACK | Freq: Every day | ORAL | Status: DC | PRN

## 2019-07-07 MED ORDER — CEFAZOLIN SODIUM-DEXTROSE 2-4 GM/100ML-% IV SOLN
2.0000 g | INTRAVENOUS | Status: AC
  Administered 2019-07-07 (×2): 2 g via INTRAVENOUS

## 2019-07-07 MED ORDER — HEMOSTATIC AGENTS (NO CHARGE) OPTIME
TOPICAL | Status: DC | PRN
  Administered 2019-07-07: 2

## 2019-07-07 MED ORDER — ACETAMINOPHEN 500 MG PO TABS
1000.0000 mg | ORAL_TABLET | Freq: Once | ORAL | Status: AC
  Administered 2019-07-07: 1000 mg via ORAL
  Filled 2019-07-07: qty 2

## 2019-07-07 MED ORDER — OXYCODONE-ACETAMINOPHEN 10-325 MG PO TABS: 1.0000 | ORAL_TABLET | Freq: Four times a day (QID) | ORAL | 0 refills | Status: AC | PRN

## 2019-07-07 MED ORDER — LIDOCAINE HCL (CARDIAC) PF 100 MG/5ML IV SOSY
PREFILLED_SYRINGE | INTRAVENOUS | Status: DC | PRN
  Administered 2019-07-07: 80 mg via INTRAVENOUS

## 2019-07-07 MED ORDER — SUCCINYLCHOLINE CHLORIDE 200 MG/10ML IV SOSY
PREFILLED_SYRINGE | INTRAVENOUS | Status: AC
  Filled 2019-07-07: qty 10

## 2019-07-07 MED ORDER — ONDANSETRON HCL 4 MG PO TABS
4.0000 mg | ORAL_TABLET | Freq: Four times a day (QID) | ORAL | Status: DC | PRN
  Administered 2019-07-09: 4 mg via ORAL
  Filled 2019-07-07: qty 1

## 2019-07-07 MED ORDER — ONDANSETRON HCL 4 MG/2ML IJ SOLN
4.0000 mg | Freq: Four times a day (QID) | INTRAMUSCULAR | Status: DC | PRN
  Administered 2019-07-09: 4 mg via INTRAVENOUS
  Filled 2019-07-07: qty 2

## 2019-07-07 MED ORDER — ONDANSETRON HCL 4 MG/2ML IJ SOLN
INTRAMUSCULAR | Status: AC
  Filled 2019-07-07: qty 2

## 2019-07-07 MED ORDER — SODIUM CHLORIDE 0.9 % IV SOLN: 250.0000 mL | INTRAVENOUS | Status: DC

## 2019-07-07 MED ORDER — PROPOFOL 500 MG/50ML IV EMUL
INTRAVENOUS | Status: DC | PRN
Start: 2019-07-07 — End: 2019-07-07
  Administered 2019-07-07 (×2): 75 ug/kg/min via INTRAVENOUS

## 2019-07-07 MED ORDER — INSULIN ASPART 100 UNIT/ML ~~LOC~~ SOLN
0.0000 [IU] | Freq: Every day | SUBCUTANEOUS | Status: DC
  Administered 2019-07-07: 3 [IU] via SUBCUTANEOUS

## 2019-07-07 MED ORDER — BUPIVACAINE HCL (PF) 0.5 % IJ SOLN
INTRAMUSCULAR | Status: DC | PRN
  Administered 2019-07-07: 20 mL

## 2019-07-07 MED ORDER — AMLODIPINE BESYLATE 5 MG PO TABS
5.0000 mg | ORAL_TABLET | Freq: Every day | ORAL | Status: DC
  Administered 2019-07-08 – 2019-07-10 (×3): 5 mg via ORAL
  Filled 2019-07-07 (×3): qty 1

## 2019-07-07 MED ORDER — METHOCARBAMOL 500 MG PO TABS
500.0000 mg | ORAL_TABLET | Freq: Four times a day (QID) | ORAL | Status: DC | PRN
  Administered 2019-07-07 – 2019-07-08 (×3): 500 mg via ORAL
  Filled 2019-07-07 (×3): qty 1

## 2019-07-07 MED ORDER — THROMBIN 20000 UNITS EX SOLR: CUTANEOUS | Status: DC | PRN

## 2019-07-07 MED ORDER — BUPIVACAINE HCL (PF) 0.5 % IJ SOLN
INTRAMUSCULAR | Status: AC
  Filled 2019-07-07: qty 30

## 2019-07-07 MED ORDER — HYDROMORPHONE HCL 1 MG/ML IJ SOLN
0.2500 mg | INTRAMUSCULAR | Status: DC | PRN
  Administered 2019-07-07 (×4): 0.5 mg via INTRAVENOUS

## 2019-07-07 MED ORDER — EPHEDRINE 5 MG/ML INJ
INTRAVENOUS | Status: AC
  Filled 2019-07-07: qty 10

## 2019-07-07 MED ORDER — FENTANYL CITRATE (PF) 100 MCG/2ML IJ SOLN: 50.0000 ug | Freq: Once | INTRAMUSCULAR | Status: DC

## 2019-07-07 MED ORDER — ACETAMINOPHEN 325 MG PO TABS: 650.0000 mg | ORAL_TABLET | ORAL | Status: DC | PRN

## 2019-07-07 MED ORDER — SODIUM CHLORIDE 0.9% FLUSH
3.0000 mL | Freq: Two times a day (BID) | INTRAVENOUS | Status: DC
  Administered 2019-07-07 – 2019-07-10 (×5): 3 mL via INTRAVENOUS

## 2019-07-07 MED ORDER — GABAPENTIN 300 MG PO CAPS
300.0000 mg | ORAL_CAPSULE | Freq: Every day | ORAL | Status: DC
  Administered 2019-07-07 – 2019-07-09 (×3): 300 mg via ORAL
  Filled 2019-07-07 (×3): qty 1

## 2019-07-07 MED ORDER — INSULIN ASPART 100 UNIT/ML ~~LOC~~ SOLN
0.0000 [IU] | Freq: Three times a day (TID) | SUBCUTANEOUS | Status: DC
  Administered 2019-07-08 (×2): 3 [IU] via SUBCUTANEOUS

## 2019-07-07 MED ORDER — FENTANYL CITRATE (PF) 250 MCG/5ML IJ SOLN
INTRAMUSCULAR | Status: AC
  Filled 2019-07-07: qty 5

## 2019-07-07 MED ORDER — SODIUM CHLORIDE 0.9% FLUSH: 3.0000 mL | INTRAVENOUS | Status: DC | PRN

## 2019-07-07 SURGICAL SUPPLY — 81 items
AGENT HMST KT MTR STRL THRMB (HEMOSTASIS) ×2
BIT DRILL PLIF MAS DISP 5.5MM (DRILL) IMPLANT
BLADE SURG 10 STRL SS (BLADE) ×2 IMPLANT
BONE MATRIX OSTEOCEL PRO LRG (Bone Implant) ×1 IMPLANT
BUR NEURO DRILL SOFT 3.0X3.8M (BURR) ×1 IMPLANT
CAP RELINE MOD TULIP RMM (Cap) ×8 IMPLANT
CATH FOLEY 2WAY SLVR  5CC 16FR (CATHETERS) ×2
CATH FOLEY 2WAY SLVR 5CC 16FR (CATHETERS) ×1 IMPLANT
CLIP NEUROVISION LG (CLIP) ×1 IMPLANT
CLSR STERI-STRIP ANTIMIC 1/2X4 (GAUZE/BANDAGES/DRESSINGS) ×2 IMPLANT
COVER MAYO STAND STRL (DRAPES) ×1 IMPLANT
COVER SURGICAL LIGHT HANDLE (MISCELLANEOUS) ×3 IMPLANT
COVER WAND RF STERILE (DRAPES) ×2 IMPLANT
DRAIN CHANNEL 15F RND FF W/TCR (WOUND CARE) ×1 IMPLANT
DRAPE C-ARM 42X72 X-RAY (DRAPES) ×3 IMPLANT
DRAPE C-ARMOR (DRAPES) ×4 IMPLANT
DRAPE INCISE IOBAN 66X45 STRL (DRAPES) IMPLANT
DRILL PLIF MAS DISP 5.5MM (DRILL) ×2
DRSG MEPILEX BORDER 4X4 (GAUZE/BANDAGES/DRESSINGS) ×1 IMPLANT
DRSG OPSITE POSTOP 3X4 (GAUZE/BANDAGES/DRESSINGS) ×1 IMPLANT
DRSG OPSITE POSTOP 4X6 (GAUZE/BANDAGES/DRESSINGS) ×3 IMPLANT
DURAPREP 26ML APPLICATOR (WOUND CARE) ×2 IMPLANT
ELECT BLADE 4.0 EZ CLEAN MEGAD (MISCELLANEOUS) ×2
ELECT PENCIL ROCKER SW 15FT (MISCELLANEOUS) ×2 IMPLANT
ELECT REM PT RETURN 9FT ADLT (ELECTROSURGICAL) ×2
ELECTRODE BLDE 4.0 EZ CLN MEGD (MISCELLANEOUS) ×1 IMPLANT
ELECTRODE REM PT RTRN 9FT ADLT (ELECTROSURGICAL) ×1 IMPLANT
EVACUATOR SILICONE 100CC (DRAIN) ×1 IMPLANT
GLOVE BIO SURGEON STRL SZ 6.5 (GLOVE) ×3 IMPLANT
GLOVE BIOGEL PI IND STRL 6.5 (GLOVE) ×1 IMPLANT
GLOVE BIOGEL PI IND STRL 8.5 (GLOVE) ×1 IMPLANT
GLOVE BIOGEL PI INDICATOR 6.5 (GLOVE) ×2
GLOVE BIOGEL PI INDICATOR 8.5 (GLOVE) ×1
GLOVE SS BIOGEL STRL SZ 8.5 (GLOVE) ×1 IMPLANT
GLOVE SUPERSENSE BIOGEL SZ 8.5 (GLOVE) ×2
GOWN STRL REUS W/ TWL LRG LVL3 (GOWN DISPOSABLE) ×2 IMPLANT
GOWN STRL REUS W/ TWL XL LVL3 (GOWN DISPOSABLE) ×2 IMPLANT
GOWN STRL REUS W/TWL 2XL LVL3 (GOWN DISPOSABLE) ×4 IMPLANT
GOWN STRL REUS W/TWL LRG LVL3 (GOWN DISPOSABLE) ×4
GOWN STRL REUS W/TWL XL LVL3 (GOWN DISPOSABLE) ×4
KIT BASIN OR (CUSTOM PROCEDURE TRAY) ×2 IMPLANT
KIT DILATOR XLIF 5 (KITS) IMPLANT
KIT SURGICAL ACCESS MAXCESS 4 (KITS) ×1 IMPLANT
KIT TURNOVER KIT B (KITS) ×2 IMPLANT
KIT XLIF (KITS) ×1
MARKER SKIN DUAL TIP RULER LAB (MISCELLANEOUS) ×1 IMPLANT
MODULE EMG NDL SSEP NVM5 (NEEDLE) IMPLANT
MODULE EMG NEEDLE SSEP NVM5 (NEEDLE) ×2 IMPLANT
MODULE NVM5 NEXT GEN EMG (NEEDLE) ×2 IMPLANT
MODULUS XL 12X18X45MM - 10 (Cage) ×1 IMPLANT
NDL SPNL 18GX3.5 QUINCKE PK (NEEDLE) ×1 IMPLANT
NEEDLE 22X1 1/2 (OR ONLY) (NEEDLE) ×2 IMPLANT
NEEDLE SPNL 18GX3.5 QUINCKE PK (NEEDLE) ×2 IMPLANT
NS IRRIG 1000ML POUR BTL (IV SOLUTION) ×4 IMPLANT
PACK LAMINECTOMY ORTHO (CUSTOM PROCEDURE TRAY) ×2 IMPLANT
PACK UNIVERSAL I (CUSTOM PROCEDURE TRAY) ×2 IMPLANT
PAD ARMBOARD 7.5X6 YLW CONV (MISCELLANEOUS) ×4 IMPLANT
PROBE BALL TIP NVM5 SNG USE (BALLOONS) ×1 IMPLANT
ROD RELINE COCR LORD 5X40MM (Rod) ×2 IMPLANT
SCREW LOCK RSS 4.5/5.0MM (Screw) ×8 IMPLANT
SCREW SHANK RELINE MOD 5.5X35 (Screw) ×4 IMPLANT
SPONGE LAP 4X18 RFD (DISPOSABLE) ×2 IMPLANT
SPONGE SURGIFOAM ABS GEL 100 (HEMOSTASIS) ×2 IMPLANT
SPONGE SURGIFOAM ABS GEL SZ50 (HEMOSTASIS) ×1 IMPLANT
STAPLER VISISTAT 35W (STAPLE) ×2 IMPLANT
SURGIFLO W/THROMBIN 8M KIT (HEMOSTASIS) ×2 IMPLANT
SUT BONE WAX W31G (SUTURE) IMPLANT
SUT ETHILON 2 0 FS 18 (SUTURE) ×1 IMPLANT
SUT MNCRL AB 3-0 PS2 27 (SUTURE) ×4 IMPLANT
SUT VIC AB 0 CT1 18XCR BRD 8 (SUTURE) IMPLANT
SUT VIC AB 0 CT1 8-18 (SUTURE) ×2
SUT VIC AB 1 CT1 18XCR BRD 8 (SUTURE) ×2 IMPLANT
SUT VIC AB 1 CT1 8-18 (SUTURE) ×4
SUT VIC AB 2-0 CT1 18 (SUTURE) ×5 IMPLANT
SUT VICRYL 0 UR6 27IN ABS (SUTURE) ×2 IMPLANT
SYR BULB IRRIG 60ML STRL (SYRINGE) ×2 IMPLANT
TAPE CLOTH 4X10 WHT NS (GAUZE/BANDAGES/DRESSINGS) ×2 IMPLANT
TOWEL GREEN STERILE (TOWEL DISPOSABLE) ×2 IMPLANT
TOWEL GREEN STERILE FF (TOWEL DISPOSABLE) ×2 IMPLANT
WATER STERILE IRR 1000ML POUR (IV SOLUTION) ×2 IMPLANT
YANKAUER SUCT BULB TIP NO VENT (SUCTIONS) ×1 IMPLANT

## 2019-07-07 NOTE — Transfer of Care (Signed)
Immediate Anesthesia Transfer of Care Note  Patient: Elfa Wooton  Procedure(s) Performed: ANTERIOR LATERAL LUMBAR FUSION (XLIF) LUMBAR TWO THROUGH LUMBAR THREE, POSTERIOR SPINAL FUSION INTERBODY LUMBAR TWO THROUGH LUMBAR THREE (N/A Spine Lumbar)  Patient Location: PACU  Anesthesia Type:General  Level of Consciousness: drowsy and patient cooperative  Airway & Oxygen Therapy: Patient Spontanous Breathing and Patient connected to nasal cannula oxygen  Post-op Assessment: Report given to RN and Post -op Vital signs reviewed and stable  Post vital signs: Reviewed and stable  Last Vitals:  Vitals Value Taken Time  BP 142/72 07/07/19 1939  Temp 36.2 C 07/07/19 1939  Pulse 89 07/07/19 1943  Resp 19 07/07/19 1943  SpO2 100 % 07/07/19 1943  Vitals shown include unvalidated device data.  Last Pain:  Vitals:   07/07/19 1056  TempSrc:   PainSc: 7       Patients Stated Pain Goal: 5 (83/29/19 1660)  Complications: No complications documented.

## 2019-07-07 NOTE — Anesthesia Procedure Notes (Signed)
Procedure Name: Intubation Date/Time: 07/07/2019 2:11 PM Performed by: Bueford Arp T, CRNA Pre-anesthesia Checklist: Patient identified, Emergency Drugs available, Suction available and Patient being monitored Patient Re-evaluated:Patient Re-evaluated prior to induction Oxygen Delivery Method: Circle system utilized Preoxygenation: Pre-oxygenation with 100% oxygen Induction Type: IV induction Ventilation: Mask ventilation without difficulty Laryngoscope Size: Mac and 3 Grade View: Grade I Tube type: Oral Tube size: 7.5 mm Number of attempts: 1 Airway Equipment and Method: Stylet and Oral airway Placement Confirmation: ETT inserted through vocal cords under direct vision,  positive ETCO2 and breath sounds checked- equal and bilateral Secured at: 22 cm Tube secured with: Tape Dental Injury: Teeth and Oropharynx as per pre-operative assessment  Comments: By Alvera Novel, EMT student

## 2019-07-07 NOTE — Progress Notes (Signed)
Patient's daughter, Helyn App notified of surgery delay. Left VM and sent txt.

## 2019-07-07 NOTE — Discharge Instructions (Signed)
  Spinal Fusion, Adult, Care After This sheet gives you information about how to care for yourself after your procedure. Your doctor may also give you more specific instructions. If you have problems or questions, contact your doctor. Follow these instructions at home: Medicines Take over-the-counter and prescription medicines only as told by your doctor. These include any medicines for pain or blood-thinning medicines (anticoagulants). If you were prescribed an antibiotic medicine, take it as told by your doctor. Do not stop taking the antibiotic even if you start to feel better. Do not drive for 24 hours if you were given a medicine to help you relax (sedative) during your procedure. Do not drive or use heavy machinery while taking prescription pain medicine. If you have a brace: Wear the brace as told by your doctor. Take it off only as told by your doctor. Keep the brace clean. Managing pain, stiffness, and swelling If directed, put ice on the surgery area: If you have a removable brace, take it off as told by your doctor. Put ice in a plastic bag. Place a towel between your skin and the bag. Leave the ice on for 20 minutes, 2-3 times a day. Surgery cut care    Follow instructions from your doctor about how to take care of your cut from surgery (incision). Make sure you: Wash your hands with soap and water before you change your bandage (dressing). If you cannot use soap and water, use hand sanitizer. Change your bandage as told by your doctor. Leave stitches (sutures), skin glue, or skin tape (adhesive) strips in place. They may need to stay in place for 2 weeks or longer. If tape strips get loose and curl up, you may trim the loose edges. Do not remove tape strips completely unless your doctor says it is okay. Keep your cut from surgery clean and dry. Do not take baths, swim, or use a hot tub until your doctor says it is okay. Ask your doctor if you can take showers. You may only  be allowed to take sponge baths. Every day, check your cut from surgery and the area around it for: More redness, swelling, or pain. Fluid or blood. Warmth. Pus or a bad smell. If you have a drain tube, follow instructions from your doctor about caring for it. Do not take out the drain tube or any bandages unless your doctor says it is okay. Physical activity Rest and protect your back as much as possible. Follow instructions from your doctor about how to move. Use good posture to help your spine heal. Do not lift anything that is heavier than 8 lb (3.6 kg), or the limit that you are told, until your doctor says that it is safe. Do not twist or bend at the waist until your doctor says it is okay. It is best if you: Do not make pushing and pulling motions. Do not sit or lie down in the same position for a long time. Do not raise your hands or arms above your head. Return to your normal activities as told by your doctor. Ask your doctor what activities are safe for you. Rest and protect your back as much as you can. Do not start to exercise until your doctor says it is okay. Ask your doctor what kinds of exercise you can do to make your back stronger. Ok to shower in 5 days.  Do not take a bath or submerge the wound General instructions To prevent blood clots and lessen swelling   in your legs: Wear compression stockings as told. Walk one or more times every few hours as told by your doctor. Do not use any products that contain nicotine or tobacco, such as cigarettes and e-cigarettes. These can delay bone healing. If you need help quitting, ask your doctor. To prevent or treat constipation while you are taking prescription pain medicine, your doctor may suggest that you: Drink enough fluid to keep your pee (urine) pale yellow. Take over-the-counter or prescription medicines. Eat foods that are high in fiber. These include fresh fruits and vegetables, whole grains, and beans. Limit foods that  are high in fat and processed sugars, such as fried and sweet foods. Keep all follow-up visits as told by your doctor. This is important. Contact a doctor if: Your pain gets worse. Your medicine does not help your pain. Your legs or feet get painful or swollen. Your cut from surgery is more red, swollen, or painful. Your cut from surgery feels warm to the touch. You have: Fluid or blood coming from your cut from surgery. Pus or a bad smell coming from your cut from surgery. A fever. Weakness or loss of feeling (numbness) in your legs that is new or getting worse. Trouble controlling when you pee (urinate) or poop (have a bowel movement). You feel sick to your stomach (nauseous). You throw up (vomit). Get help right away if: Your pain is very bad. You have chest pain. You have trouble breathing. You start to have a cough. These symptoms may be an emergency. Do not wait to see if the symptoms will go away. Get medical help right away. Call your local emergency services (911 in the [place] Do not drive yourself to the hospital. Summary After the procedure, it is common to have pain in your back and pain by your surgery cut(s). Icing and pain medicines may help to control the pain. Follow directions from your doctor. Rest and protect your back as much as possible. Do not twist or bend at the waist. Get up and walk one or more times every few hours as told by your doctor. This information is not intended to replace advice given to you by your health care provider. Make sure you discuss any questions you have with your health care provider.  -signs and symptoms of a blood clot such as chest pain; shortness of breath; pain, swelling, or warmth in the leg -signs and symptoms of a stroke such as changes in vision; confusion; trouble speaking or understanding; severe headaches; sudden numbness or weakness of the face, arm or leg; trouble walking; dizziness; loss of coordination Side effects that  usually do not require medical attention (report to your doctor or health care professional if they continue or are bothersome): -hair loss -pain, redness, or irritation at site where injected This list may not describe all possible side effects. Call your doctor for medical advice about side effects. You may report side effects to FDA at 1-800-FDA-1088. Where should I keep my medicine? Keep out of the reach of children. Store at room temperature between 15 and 30 degrees C (59 and 86 degrees F). Do not freeze. If your injections have been specially prepared, you may need to store them in the refrigerator. Ask your pharmacist. Throw away any unused medicine after the expiration date. NOTE: This sheet is a summary. It may not cover all possible information. If you have questions about this medicine, talk to your doctor, pharmacist, or health care provider.       F). Do not freeze. If your injections have been specially prepared, you may need to store them in the refrigerator. Ask your pharmacist. Throw away any unused medicine after the expiration date. NOTE: This sheet is a summary. It may not cover all possible information. If you have questions about this medicine, talk to your doctor, pharmacist, or health care provider.

## 2019-07-07 NOTE — Brief Op Note (Signed)
07/07/2019  7:11 PM  PATIENT:  Veronica Fletcher  54 y.o. female  PRE-OPERATIVE DIAGNOSIS:  L2-3 degnerative disc disease with radicular pain  POST-OPERATIVE DIAGNOSIS:  L2-3 degnerative disc disease with radicular pain  PROCEDURE:  Procedure(s) with comments: ANTERIOR LATERAL LUMBAR FUSION (XLIF) LUMBAR TWO THROUGH LUMBAR THREE, POSTERIOR SPINAL FUSION INTERBODY LUMBAR TWO THROUGH LUMBAR THREE (N/A) - 4 HRS  SURGEON:  Surgeon(s) and Role:    Melina Schools, MD - Primary  PHYSICIAN ASSISTANT:   ASSISTANTS: Amanda Ward, PA   ANESTHESIA:   general  EBL:  200 mL   BLOOD ADMINISTERED:none  DRAINS: 1 JP drain in the back   LOCAL MEDICATIONS USED:  MARCAINE     SPECIMEN:  No Specimen  DISPOSITION OF SPECIMEN:  N/A  COUNTS:  YES  TOURNIQUET:  * No tourniquets in log *  DICTATION: .Dragon Dictation  PLAN OF CARE: Admit to inpatient   PATIENT DISPOSITION:  PACU - hemodynamically stable.

## 2019-07-07 NOTE — Anesthesia Postprocedure Evaluation (Signed)
Anesthesia Post Note  Patient: Veronica Fletcher  Procedure(s) Performed: ANTERIOR LATERAL LUMBAR FUSION (XLIF) LUMBAR TWO THROUGH LUMBAR THREE, POSTERIOR SPINAL FUSION INTERBODY LUMBAR TWO THROUGH LUMBAR THREE (N/A Spine Lumbar)     Patient location during evaluation: PACU Anesthesia Type: General Level of consciousness: sedated Pain management: pain level controlled Vital Signs Assessment: post-procedure vital signs reviewed and stable Respiratory status: spontaneous breathing and respiratory function stable Cardiovascular status: stable Postop Assessment: no apparent nausea or vomiting Anesthetic complications: no   No complications documented.  Last Vitals:  Vitals:   07/07/19 2030 07/07/19 2045  BP: (!) 179/85 (!) 173/82  Pulse: (!) 105 (!) 109  Resp: 15 16  Temp:  37.1 C  SpO2: 98% 95%    Last Pain:  Vitals:   07/07/19 2045  TempSrc:   PainSc: 6                  Veronica Fletcher DANIEL

## 2019-07-07 NOTE — H&P (Signed)
Addendum H&P  Veronica Fletcher is a very pleasant 54 year old man with significant spinal stenosis and neurogenic claudication right radicular leg pain worse than the left.  Attempts at conservative management had failed to alleviate her symptoms and so we have elected to proceed with surgery.  All appropriate risks benefits and alternatives were discussed with the patient and consent was obtained.  Operative plan: L2-3 lateral interbody fusion.  Posterior right facetectomy/foraminotomy.  Posterior pedicle screw fixation and fusion with posterior lateral arthrodesis.  There is been no change in patient's clinical exam since her last office visit of 06/28/2019.  All of her questions were encouraged and addressed and she is expressed her desire to move forward with surgery.

## 2019-07-07 NOTE — Anesthesia Preprocedure Evaluation (Signed)
Anesthesia Evaluation  Patient identified by MRN, date of birth, ID band Patient awake    Reviewed: Allergy & Precautions, NPO status , Patient's Chart, lab work & pertinent test results  Airway Mallampati: II  TM Distance: >3 FB Neck ROM: Full    Dental  (+) Dental Advisory Given   Pulmonary asthma , former smoker,    breath sounds clear to auscultation       Cardiovascular hypertension, Pt. on medications  Rhythm:Regular Rate:Normal     Neuro/Psych negative neurological ROS     GI/Hepatic negative GI ROS, Neg liver ROS,   Endo/Other  diabetes  Renal/GU negative Renal ROS     Musculoskeletal   Abdominal   Peds  Hematology negative hematology ROS (+)   Anesthesia Other Findings   Reproductive/Obstetrics                             Lab Results  Component Value Date   WBC 8.0 07/05/2019   HGB 13.9 07/05/2019   HCT 45.6 07/05/2019   MCV 94.0 07/05/2019   PLT 178 07/05/2019   Lab Results  Component Value Date   CREATININE 0.69 07/05/2019   BUN 10 07/05/2019   NA 140 07/05/2019   K 3.7 07/05/2019   CL 104 07/05/2019   CO2 25 07/05/2019    Anesthesia Physical Anesthesia Plan  ASA: II  Anesthesia Plan: General   Post-op Pain Management:    Induction: Intravenous  PONV Risk Score and Plan: 3 and Dexamethasone, Ondansetron and Treatment may vary due to age or medical condition  Airway Management Planned: Oral ETT  Additional Equipment:   Intra-op Plan:   Post-operative Plan: Extubation in OR  Informed Consent: I have reviewed the patients History and Physical, chart, labs and discussed the procedure including the risks, benefits and alternatives for the proposed anesthesia with the patient or authorized representative who has indicated his/her understanding and acceptance.     Dental advisory given  Plan Discussed with: CRNA  Anesthesia Plan Comments:          Anesthesia Quick Evaluation

## 2019-07-07 NOTE — Op Note (Signed)
Operative report  Preoperative diagnosis: Degenerative lumbar disc disease with lumbar spinal stenosis and radicular leg pain L2-3.  Postoperative diagnosis: Same  Operative procedure: Lateral interbody fusion L2-3.  Posterior lumbar instrumented fusion and posterior lateral arthrodesis L2-3.  Posterior lumbar decompression L2-3.  Complications: None  EBL 300 cc  Condition: Stable  First Assistant: Estill Bamberg will Ward, PA  Implants: NuVasive lateral titanium 3D intervertebral cage.  12 x 18 x 45 mm.  10 degree lordosis.  5.5 x 35 mm cortical placed pedicle screws L2 and L3 bilaterally.  Neuro monitoring: No adverse free running EMG activity or abnormal SSEP activity.  All 4 pedicle screws were directly stimulated after placement.  On the right side: L2 27 mA, L3: 14 mA.  Left side: L2: 30 mA, L3: 26 mA.  Indications: Veronica Fletcher is a very pleasant 54 year old woman with severe back buttock and radicular leg pain.  Attempts at conservative management had failed to alleviate her symptoms and her overall quality of life has continued to deteriorate.  As a result we elected to move forward with surgery.  All appropriate risks benefits and alternatives were discussed with the patient and consent was obtained.  Operative report: Patient was brought the operating room placed on the operating room table.  After successful induction of general anesthesia and endotracheal intubation teds SCDs and a Foley were inserted.  The neuro monitoring rep placed all appropriate needles and pads for intraoperative SSEP and free running EMG monitoring.  The patient was turned into the left lateral decubitus position for the lateral interbody fusion portion of the case.  Axillary roll was placed and all bony prominences were well-padded.  Patient was directly secured to the table with tape ensuring using fluoroscopy that the L2-3 disc space was properly imaged in both the AP and lateral planes.  The lateral aspect of the  body was prepped and draped in a standard fashion and a timeout was taken confirming patient procedure and all other important data.  I marked out the anterior and posterior margins of the L2-3 disc space and infiltrated the area with quarter percent Marcaine with epinephrine.  Incision was made and sharp dissection was carried out down to the deep fascia.  I exposed the deep fascia and then made another small incision 1 fingerbreadth posterior to the transverse incision.  I bluntly dissected through the incision to the posterior aspect of the retroperitoneum.  I then pierced into the retroperitoneum and began bluntly dissecting and mobilizing the area.  I then dissected through the undersurface of the oblique musculature until my finger was visualized in the initial transverse incision.  I then placed the first dilating tube down to the surface of the psoas muscle and then stimulated circumferentially to confirm that I was not traumatizing the plexus.  I then advanced through the psoas to the posterior third of the vertebral body.  Once I confirmed satisfactory position I then sequentially dilated and stimulated confirming satisfactory position using fluoroscopy and neuro monitoring.  I then placed the working retractor and removed the dilators.  I then gently moved the working portal as posterior as I possibly could.  Once I reached the most posterior portion I then secured it into the disc space with the shim.  Once it was locked in position I then confirmed I had satisfactory position and visualization of the lateral aspect of the L2-3 disc space.  I stimulated behind the posterior blade as well as inferiorly and superiorly and anteriorly to ensure was not  traumatizing the plexus.  An annulotomy was then performed with a 10 blade scalpel and using pituitary rongeurs curettes and Kerrison rongeurs I removed all of the disc material and used a Cobb elevator to release the contralateral annulus.  I then used a  box osteotome to remove the bulk of the disc material.  I then used sequential trialing devices until I had the proper fit.  The size 1245 trial provided the best overall distraction and indirect foraminal decompression.  It was slightly anterior in the disc space but it was well fitting and it did not appear to have violated the anterior longitudinal ligament.  I remove the trial implant and try to reposition the posterior blade but was unsuccessful.  In any attempt at repositioning there was increased EMG activity indicating plexus irritation.  At this point since the trial had excellent fixation I elected to move forward with it.  I obtained the permanent implant and packed it with the allograft.  I rasped the endplates until I confirmed I had bleeding subchondral bone.  I then irrigated the wound copiously normal saline and then inserted the 12 x 18 implant.  I malleted down across till was properly fitted.  I then remove the inserting devices and irrigated the wound copiously.  I then used bipolar electrocautery and FloSeal to obtain and maintain hemostasis.  I removed the retractor and took final x-rays.  I did feel as though the cage was slightly more anterior in the disc space but I could not reposition it.  It was secured in the disc space and there was no evidence to suggest that the anterior longitudinal ligament had been compromised.  I also saw excellent decompression of the foramen.  At this point I irrigated the wound copiously normal saline and closed the deep fascia of both wounds with interrupted #1 Vicryl sutures.  I then closed with 2-0 Vicryl suture and a 3-0 Monocryl for the skin.  Dry dressings were applied.  Final intraoperative fluoroscopy views of the abdomen were taken and read by the radiologist confirming there were no retained surgical instruments.  The drapes were removed and the patient was turned supine on the pro axis table.  The flat Glennon Mac table was brought in and the patient  was turned prone onto the [place] frame.  All bony prominences were well-padded and the back was prepped and draped in a standard fashion.  A second timeout was taken confirming the second portion of the procedure.  Using fluoroscopy identified the superior aspect of the L2 pedicle and the inferior portion of the L3 spinous process.  I marked out this incision and infiltrated it with quarter percent Marcaine with epinephrine.  Midline incision was made and sharp dissection was carried out down to the deep fascia.  I incised the deep fascia and stripped the paraspinal muscles to expose the L1 to facet complex and the L2-3 facet complex.  Self-retaining retractor was placed and out excellent visualization of the posterior aspect of the spine.  I remove the bulk of the L2  spinous process in order to allow for better visualization and placement of pedicle probe.  Using AP fluoroscopy identified the 5 o'clock position of the left L2 pedicle I decorticated the area with a high-speed bur and then placed my all and then advanced it under fluoroscopic guidance into the L2 pedicle aiming towards the 11 o'clock position.  Once I was nearing the 11 o'clock position I confirmed on the lateral view that I had  crossed the posterior vertebral wall.  Once I confirm this I advanced him and then remove the probe.  I then palpated with a ball-tipped feeler and then tapped and repalpated with a ball-tipped feeler.  I then placed the 35 mm cortical pedicle screw.  I repeated this exact same procedure for the L3 pedicle on this side.  Once both these pedicles were placed on the left-hand side turned my attention to the right.  On the right side identified the 7 o'clock position on the L2 pedicle and decorticated that area I then advanced the pedicle all into the pedicle aiming for the 1 o'clock position.  I then confirmed on the lateral that had the appropriate trajectory and advanced it into the vertebral body.  I then palpated the  hole with a ball-tipped feeler confirming a solid canal.  I then tapped repalpated and then placed the same size cortical screw.  I repeated this exact same procedure at L3.  At this point all 4 pedicle screws were properly positioned.  I then directly stimulated each screw and there was no adverse activity at greater than 14 mA.  With the L2 spinous process removed I used a fine curved curette to develop a plane underneath the L2 lamina using a 3 mm Kerrison rongeurs I performed a laminotomy of L2 on the right side..  I then dissected through the central raphae of the ligamentum flavum and used my 2 mm Kerrison rongeur to remove the ligamentum flavum.  At this point I can easily palpate into the lateral recess with my [place] 4.  I could also palpate superiorly up to the L2 pedicle and inferiorly out into the L3 foramen.  I was able to do this bilaterally.  I used fluoroscopy to confirm that my [facility] spanned the area of maximal stenosis.  At this point I was quite pleased that I achieved an indirect decompression which I was able to confirm with my laminotomy and partial facetectomy.  At this point with an adequate decompression I did not elect to be more aggressive.  I felt as though the nerve root and canal were adequately decompressed.  I then used a high-speed bur to decorticate the L2-3 facet comple and the L2 transverse process bilaterally.  Using the bone that I had harvested from the decompression along with remaining allograft I packed the posterior lateral gutter.  I then irrigated the wound copiously normal saline and made sure that hemostasis using bipolar cautery and FloSeal.  A thrombin-soaked Gelfoam patty was placed over the laminotomy site and then I placed a deep drain which was brought out of a separate stab incision.  I closed the deep fascia with interrupted #1 Vicryl sutures then a layer 2-0 Vicryl suture and finally 3-0 Monocryl.  Steri-Strips dry dressing were applied and the  patient was ultimately extubated transfer the PACU without incident.  The end of the case all needle sponge counts were correct.

## 2019-07-08 ENCOUNTER — Encounter (HOSPITAL_COMMUNITY): Payer: Self-pay | Admitting: Orthopedic Surgery

## 2019-07-08 LAB — GLUCOSE, CAPILLARY
Glucose-Capillary: 197 mg/dL — ABNORMAL HIGH (ref 70–99)
Glucose-Capillary: 183 mg/dL — ABNORMAL HIGH (ref 70–99)
Glucose-Capillary: 100 mg/dL — ABNORMAL HIGH (ref 70–99)
Glucose-Capillary: 60 mg/dL — ABNORMAL LOW (ref 70–99)
Glucose-Capillary: 71 mg/dL (ref 70–99)
Glucose-Capillary: 74 mg/dL (ref 70–99)

## 2019-07-08 MED ORDER — CEFAZOLIN SODIUM-DEXTROSE 1-4 GM/50ML-% IV SOLN
1.0000 g | Freq: Three times a day (TID) | INTRAVENOUS | Status: DC
  Administered 2019-07-08 – 2019-07-09 (×2): 1 g via INTRAVENOUS
  Filled 2019-07-08 (×2): qty 50

## 2019-07-08 MED ORDER — MAGNESIUM CITRATE PO SOLN
1.0000 | Freq: Once | ORAL | Status: AC
  Administered 2019-07-08: 1 via ORAL
  Filled 2019-07-08: qty 296

## 2019-07-08 MED ORDER — HYDROCODONE-ACETAMINOPHEN 7.5-325 MG PO TABS
1.0000 | ORAL_TABLET | ORAL | Status: DC | PRN
  Administered 2019-07-09: 1 via ORAL
  Administered 2019-07-09 – 2019-07-10 (×6): 2 via ORAL
  Filled 2019-07-08 (×3): qty 2
  Filled 2019-07-08: qty 1
  Filled 2019-07-08 (×3): qty 2

## 2019-07-08 MED ORDER — HYDROXYZINE HCL 50 MG/ML IM SOLN
50.0000 mg | Freq: Four times a day (QID) | INTRAMUSCULAR | Status: DC | PRN
  Administered 2019-07-08 (×2): 50 mg via INTRAMUSCULAR
  Filled 2019-07-08 (×3): qty 1

## 2019-07-08 MED ORDER — ASPIRIN EC 81 MG PO TBEC: 81.0000 mg | DELAYED_RELEASE_TABLET | Freq: Every day | ORAL | Status: DC

## 2019-07-08 MED FILL — Thrombin (Recombinant) For Soln 20000 Unit: CUTANEOUS | Qty: 1 | Status: AC

## 2019-07-08 NOTE — Evaluation (Signed)
Occupational Therapy Evaluation Patient Details Name: Veronica Fletcher MRN: [id number] DOB: [birth year] Today's Date: 07/08/2019    History of Present Illness 54 y.o. female presenting with Degenerative lumbar disc disease with lumbar spinal stenosis and radicular leg pain L2-3. Pt. s/p PLIF and L2-3 decompression on 6/24 by Dr. Rolena Infante. PMHx significant for HTN and DM II.    Clinical Impression   Prior to admission, patient was independent with all BADLs/IADLs with use of SPC vc. QC. Patient currently presents below baseline level of function requiring grossly Min guard to Min A with all LB BADLs, functional transfers, and mobility with use of RW. Patient also limited by pain and lethargy with difficulty keeping eyes open this date. Patient would benefit from continued acute OT services to maximize safety and independence with self-care tasks and receive education on acquisition and use of AE/DME in prep for return home with family and initial 24 hour supervision/assist. No follow-up OT needs at this time.     Follow Up Recommendations  No OT follow up    Equipment Recommendations  None recommended by OT;Other (comment) (Patient has BSC at home. )    Recommendations for Other Services       Precautions / Restrictions Precautions Precautions: Back Precaution Booklet Issued: Yes (comment) Required Braces or Orthoses: Spinal Brace Spinal Brace: Lumbar corset;Applied in sitting position Restrictions Weight Bearing Restrictions: No      Mobility Bed Mobility Overal bed mobility: Needs Assistance Bed Mobility: Rolling;Supine to Sit Rolling: Min guard   Supine to sit: Min guard     General bed mobility comments: Rolling to R with Min guard and increased time/effort with cues for adherence to back precautions. Supine to sit with HOB slightly elevated and Min guard.  Transfers Overall transfer level: Needs assistance Equipment used: Rolling walker (2 wheeled) Transfers: Sit to/from  [facility] Sit to Stand: Min assist;Min guard;From elevated surface Stand pivot transfers: Min guard       General transfer comment: STS from elevated EOB trial 1: Min A, trial: 2 Min guard to RW with cues for hand placement. SPT to [facility] over standard toilet with Min guard and cues for hand placement and proximity.     Balance Overall balance assessment: Needs assistance Sitting-balance support: Feet supported Sitting balance-Leahy Scale: Fair     Standing balance support: Bilateral upper extremity supported Standing balance-Leahy Scale: Poor Standing balance comment: Heavy reliance on UE on RW. 1 lateral LOB standing at sink level.                            ADL either performed or assessed with clinical judgement   ADL Overall ADL's : Needs assistance/impaired     Grooming: Min guard;Standing;Cueing for UE precautions Grooming Details (indicate cue type and reason): Sink level          Upper Body Dressing : Set up;Sitting   Lower Body Dressing: Minimal assistance;Sit to/from stand;Cueing for back precautions   Toilet Transfer: Ambulation;Minimal assistance   Toileting- Clothing Manipulation and Hygiene: Minimal assistance;Sit to/from stand       Functional mobility during ADLs: Min guard;Rolling walker General ADL Comments: Patient very lethargic initially. Nausea throughout with 1 episode of emesis. RN notified.      Vision Baseline Vision/History: Wears glasses Wears Glasses: At all times (Contacts) Patient Visual Report: No change from baseline Vision Assessment?: No apparent visual deficits     Perception     Praxis  Pertinent Vitals/Pain Pain Assessment: 0-10 Pain Score: 10-Worst pain ever Pain Location: Low back  Pain Descriptors / Indicators: Aching;Burning;Constant;Guarding;Grimacing Pain Intervention(s): Limited activity within patient's tolerance;Monitored during session;Repositioned     Hand Dominance Right    Extremity/Trunk Assessment Upper Extremity Assessment Upper Extremity Assessment: Overall WFL for tasks assessed   Lower Extremity Assessment Lower Extremity Assessment: Defer to PT evaluation   Cervical / Trunk Assessment Cervical / Trunk Assessment: Normal   Communication Communication Communication: No difficulties   Cognition Arousal/Alertness: Awake/alert Behavior During Therapy: WFL for tasks assessed/performed Overall Cognitive Status: Within Functional Limits for tasks assessed                                     General Comments       Exercises     Shoulder Instructions      Home Living Family/patient expects to be discharged to:: Private residence Living Arrangements: Spouse/significant other;Children Available Help at Discharge: Family;Available 24 hours/day (Daughter and mother can proivde initial 24 hour supervision/) Type of Home: House Home Access: Stairs to enter;Ramped entrance Entrance Stairs-Number of Steps: 5-6 Entrance Stairs-Rails: Right;Left Home Layout: One level     Bathroom Shower/Tub: Teacher, early years/pre: Standard Bathroom Accessibility: Yes How Accessible: Accessible via walker Home Equipment: Other (comment);Walker - 2 wheels;Bedside commode;Cane - single point;Cane - quad Agricultural consultant)          Prior Functioning/Environment Level of Independence: Independent                 OT Problem List: Decreased activity tolerance;Impaired balance (sitting and/or standing);Decreased knowledge of use of DME or AE;Decreased knowledge of precautions;Pain      OT Treatment/Interventions: Self-care/ADL training;DME and/or AE instruction;Energy conservation;Therapeutic activities;Patient/family education;Balance training    OT Goals(Current goals can be found in the care plan section) Acute Rehab OT Goals Patient Stated Goal: To return home.  OT Goal Formulation: With patient Time For Goal Achievement:  07/22/19 Potential to Achieve Goals: Good ADL Goals Pt Will Perform Grooming: with modified independence;standing Pt Will Perform Lower Body Dressing: with modified independence;with adaptive equipment;sit to/from stand Pt Will Transfer to Toilet: with modified independence;ambulating;bedside commode Pt Will Perform Toileting - Clothing Manipulation and hygiene: with modified independence;sit to/from stand Pt Will Perform Tub/Shower Transfer: Tub transfer;3 in 1;rolling walker  OT Frequency: Min 2X/week   Barriers to D/C:            Co-evaluation              AM-PAC OT "6 Clicks" Daily Activity     Outcome Measure Help from another person eating meals?: None Help from another person taking care of personal grooming?: A Little Help from another person toileting, which includes using toliet, bedpan, or urinal?: A Little Help from another person bathing (including washing, rinsing, drying)?: A Little Help from another person to put on and taking off regular upper body clothing?: A Little Help from another person to put on and taking off regular lower body clothing?: A Little 6 Click Score: 19   End of Session Equipment Utilized During Treatment: Gait belt;Rolling walker  Activity Tolerance: Patient limited by lethargy;Patient limited by pain Patient left: in chair;with call bell/phone within reach;with nursing/sitter in room  OT Visit Diagnosis: Unsteadiness on feet (R26.81);Pain                Time: 2836-6294 OT Time Calculation (min): 55 min Charges:  OT General Charges $OT Visit: 1 Visit OT Evaluation $OT Eval Moderate Complexity: 1 Mod OT Treatments $Self Care/Home Management : 23-37 mins  Willies Laviolette H. OTR/L Supplemental OT, Department of rehab services [phone]  [place]la R H. 07/08/2019, 10:15 AM

## 2019-07-08 NOTE — Evaluation (Signed)
Physical Therapy Evaluation Patient Details Name: Veronica Fletcher MRN: [id number] DOB: 1965/06/26 Today's Date: 07/08/2019   History of Present Illness  54 y.o. female presenting with Degenerative lumbar disc disease with lumbar spinal stenosis and radicular leg pain L2-3. Pt. s/p PLIF and L2-3 decompression on 6/24 by Dr. Rolena Infante. PMHx significant for HTN and DM II.   Clinical Impression  Pt admitted with above diagnosis. At the time of PT eval, pt was able to demonstrate transfers and ambulation with up to min assist and IV pole for support. Pt was educated on precautions, brace application/wearing schedule, appropriate activity progression, and car transfer. Pt currently with functional limitations due to the deficits listed below (see PT Problem List). Pt will benefit from skilled PT to increase their independence and safety with mobility to allow discharge to the venue listed below.      Follow Up Recommendations No PT follow up;Supervision for mobility/OOB    Equipment Recommendations  None recommended by PT    Recommendations for Other Services       Precautions / Restrictions Precautions Precautions: Back Precaution Booklet Issued: Yes (comment) Required Braces or Orthoses: Spinal Brace Spinal Brace: Lumbar corset;Applied in sitting position Restrictions Weight Bearing Restrictions: No      Mobility  Bed Mobility Overal bed mobility: Needs Assistance Bed Mobility: Rolling;Supine to Sit Rolling: Min guard   Supine to sit: Min guard     General bed mobility comments: Pt was received sitting up in the recliner chair.   Transfers Overall transfer level: Needs assistance Equipment used: Rolling walker (2 wheeled) Transfers: Sit to/from Stand Sit to Stand: Min guard Stand pivot transfers: Min guard       General transfer comment: Close guard for safety and management of drain.   Ambulation/Gait Ambulation/Gait assistance: Min assist Gait Distance (Feet): 200  Feet Assistive device: IV Pole Gait Pattern/deviations: Shuffle;Trunk flexed;Narrow base of support Gait velocity: Decreased Gait velocity interpretation: <1.31 ft/sec, indicative of household ambulator General Gait Details: Slow and guarded. Pt refusing the RW and the [facility] - only wanted to hold to the IV pole. She required occasional min assist for balance.   Stairs            Wheelchair Mobility    Modified Rankin (Stroke Patients Only)       Balance Overall balance assessment: Needs assistance Sitting-balance support: Feet supported Sitting balance-Leahy Scale: Fair     Standing balance support: Bilateral upper extremity supported Standing balance-Leahy Scale: Poor Standing balance comment: Reliant on IV pole                             Pertinent Vitals/Pain Pain Assessment: Faces Pain Score: 10-Worst pain ever Faces Pain Scale: Hurts even more Pain Location: Low back  Pain Descriptors / Indicators: Aching;Burning;Constant;Guarding;Grimacing Pain Intervention(s): Limited activity within patient's tolerance;Monitored during session;Repositioned    Home Living Family/patient expects to be discharged to:: Private residence Living Arrangements: Spouse/significant other;Children Available Help at Discharge: Family;Available 24 hours/day (Daughter and mother can proivde initial 24 hour supervision) Type of Home: House Home Access: Stairs to enter;Ramped entrance Entrance Stairs-Rails: Right;Left Entrance Stairs-Number of Steps: 5-6 Home Layout: One level Home Equipment: Other (comment);Walker - 2 wheels;Bedside commode;Cane - single point;Cane - quad Agricultural consultant)      Prior Function Level of Independence: Independent               Hand Dominance   Dominant Hand: Right    Extremity/Trunk Assessment  Upper Extremity Assessment Upper Extremity Assessment: Defer to OT evaluation    Lower Extremity Assessment Lower Extremity Assessment:  Generalized weakness    Cervical / Trunk Assessment Cervical / Trunk Assessment: Other exceptions Cervical / Trunk Exceptions: s/p surgery  Communication   Communication: No difficulties  Cognition Arousal/Alertness: Awake/alert Behavior During Therapy: WFL for tasks assessed/performed Overall Cognitive Status: Within Functional Limits for tasks assessed                                        General Comments      Exercises     Assessment/Plan    PT Assessment Patient needs continued PT services  PT Problem List Decreased strength;Decreased activity tolerance;Decreased balance;Decreased mobility;Decreased knowledge of use of DME;Decreased safety awareness;Decreased knowledge of precautions;Pain       PT Treatment Interventions DME instruction;Gait training;Functional mobility training;Therapeutic activities;Therapeutic exercise;Stair training;Neuromuscular re-education;Patient/family education    PT Goals (Current goals can be found in the Care Plan section)  Acute Rehab PT Goals Patient Stated Goal: To return home.  PT Goal Formulation: With patient Time For Goal Achievement: 07/15/19 Potential to Achieve Goals: Good    Frequency Min 5X/week   Barriers to discharge        Co-evaluation               AM-PAC PT "6 Clicks" Mobility  Outcome Measure Help needed turning from your back to your side while in a flat bed without using bedrails?: None Help needed moving from lying on your back to sitting on the side of a flat bed without using bedrails?: A Little Help needed moving to and from a bed to a chair (including a wheelchair)?: A Little Help needed standing up from a chair using your arms (e.g., wheelchair or bedside chair)?: A Little Help needed to walk in hospital room?: A Little Help needed climbing 3-5 steps with a railing? : A Little 6 Click Score: 19    End of Session Equipment Utilized During Treatment: Gait belt Activity Tolerance:  Patient limited by fatigue Patient left: in chair;with call bell/phone within reach Nurse Communication: Mobility status;Other (comment) (HR 131 bpm after gait training) PT Visit Diagnosis: Unsteadiness on feet (R26.81);Muscle weakness (generalized) (M62.81);Pain Pain - part of body:  (incision site)    Time: 8250-0370 PT Time Calculation (min) (ACUTE ONLY): 27 min   Charges:   PT Evaluation $PT Eval Low Complexity: 1 Low PT Treatments $Gait Training: 8-22 mins        Veronica Fletcher, PT, DPT Acute Rehabilitation Services Pager: [phone] Office: [phone]   Veronica Fletcher 07/08/2019, 1:00 PM

## 2019-07-08 NOTE — Progress Notes (Signed)
Hypoglycemic Event  CBG:60 Treatment: 8 oz juice/soda  Symptoms: Sweaty  Follow-up CBG: TMMI:1947 CBG Result:71  Possible Reasons for Event: Inadequate meal intake  Comments/MD notified:Hypoglycemia protocol.    Tom-Johnson, Renea Ee

## 2019-07-08 NOTE — Progress Notes (Signed)
    Subjective: Procedure(s) (LRB): ANTERIOR LATERAL LUMBAR FUSION (XLIF) LUMBAR TWO THROUGH LUMBAR THREE, POSTERIOR SPINAL FUSION INTERBODY LUMBAR TWO THROUGH LUMBAR THREE (N/A) 1 Day Post-Op  Patient reports pain as 3 on 0-10 scale.  Reports decreased leg pain reports incisional back pain   Positive void Negative bowel movement Negative flatus Negative chest pain or shortness of breath  Objective: Vital signs in last 24 hours: Temp:  [97.1 F (36.2 C)-99.3 F (37.4 C)] 99.3 F (37.4 C) (06/25 0351) Pulse Rate:  [90-112] 112 (06/25 0351) Resp:  [13-19] 18 (06/25 0351) BP: (142-180)/(72-92) 152/73 (06/25 0351) SpO2:  [94 %-100 %] 100 % (06/25 0351) Weight:  [82.6 kg] 82.6 kg (06/24 1033)  Intake/Output from previous day: 06/24 0701 - 06/25 0700 In: 2603 [I.V.:2003; IV Piggyback:600] Out: 600 [Urine:300; Drains:100; Blood:200]  Labs: Recent Labs    07/05/19 1015  WBC 8.0  RBC 4.85  HCT 45.6  PLT 178   Recent Labs    07/05/19 1015  NA 140  K 3.7  CL 104  CO2 25  BUN 10  CREATININE 0.69  GLUCOSE 117*  CALCIUM 9.7   Recent Labs    07/05/19 1015  INR 0.9    Physical Exam: Neurologically intact ABD soft Intact pulses distally Incision: dressing C/D/I Compartment soft Body mass index is 33.29 kg/m.   Assessment/Plan: Patient stable  xrays n/a Continue mobilization with physical therapy Continue care  1.  We will start Lovenox this afternoon.  Lower extremity Doppler to rule out DVT is pending. 2.  Continue to encourage ambulation as this will help restart bowel function. 3.  Patient does have hypoactive bowel sounds but her abdomen is soft and nontender and there is no rebound tenderness. 4.  Plan to DC posterior drain this afternoon as long as output remains less than 20. 5.  Possible discharge home tomorrow.  Melina Schools, MD [facility] [phone]

## 2019-07-09 LAB — CBC
HCT: 39.7 % (ref 36.0–46.0)
Hemoglobin: 12.9 g/dL (ref 12.0–15.0)
MCH: 29.9 pg (ref 26.0–34.0)
MCHC: 32.5 g/dL (ref 30.0–36.0)
MCV: 92.1 fL (ref 80.0–100.0)
Platelets: 190 10*3/uL (ref 150–400)
RBC: 4.31 MIL/uL (ref 3.87–5.11)
RDW: 13.5 % (ref 11.5–15.5)
WBC: 15.2 10*3/uL — ABNORMAL HIGH (ref 4.0–10.5)
nRBC: 0 % (ref 0.0–0.2)

## 2019-07-09 LAB — GLUCOSE, CAPILLARY
Glucose-Capillary: 134 mg/dL — ABNORMAL HIGH (ref 70–99)
Glucose-Capillary: 129 mg/dL — ABNORMAL HIGH (ref 70–99)
Glucose-Capillary: 157 mg/dL — ABNORMAL HIGH (ref 70–99)
Glucose-Capillary: 89 mg/dL (ref 70–99)

## 2019-07-09 MED ORDER — BISACODYL 10 MG RE SUPP
10.0000 mg | Freq: Every day | RECTAL | Status: DC | PRN
  Administered 2019-07-10: 10 mg via RECTAL
  Filled 2019-07-09: qty 1

## 2019-07-09 MED ORDER — FLEET ENEMA 7-19 GM/118ML RE ENEM
1.0000 | ENEMA | Freq: Once | RECTAL | Status: AC
  Administered 2019-07-09: 1 via RECTAL
  Filled 2019-07-09: qty 1

## 2019-07-09 MED ORDER — METHOCARBAMOL 500 MG PO TABS
500.0000 mg | ORAL_TABLET | Freq: Three times a day (TID) | ORAL | Status: DC | PRN
  Administered 2019-07-09: 500 mg via ORAL
  Filled 2019-07-09: qty 1

## 2019-07-09 MED ORDER — FLEET ENEMA 7-19 GM/118ML RE ENEM: 1.0000 | ENEMA | Freq: Once | RECTAL | Status: DC | PRN

## 2019-07-09 NOTE — Progress Notes (Signed)
OT Cancellation Note  Patient Details Name: Veronica Fletcher MRN: [id number] DOB: October 15, 1965   Cancelled Treatment:    Reason Eval/Treat Not Completed: Medical issues which prohibited therapy.  Pt with c/o nausea   Nilsa Nutting., OTR/L Acute Rehabilitation Services Pager [phone] Office [phone]   Lucille Passy M 07/09/2019, 3:32 PM

## 2019-07-09 NOTE — Progress Notes (Signed)
    Subjective: Procedure(s) (LRB): ANTERIOR LATERAL LUMBAR FUSION (XLIF) LUMBAR TWO THROUGH LUMBAR THREE, POSTERIOR SPINAL FUSION INTERBODY LUMBAR TWO THROUGH LUMBAR THREE (N/A) 2 Days Post-Op  Patient reports pain as 2 on 0-10 scale.  Reports decreased leg pain reports incisional back pain   Positive void Negative bowel movement Positive flatus Negative chest pain or shortness of breath  Objective: Vital signs in last 24 hours: Temp:  [98.4 F (36.9 C)-99.9 F (37.7 C)] 98.7 F (37.1 C) (06/26 0716) Pulse Rate:  [111-125] 118 (06/26 0716) Resp:  [18-20] 19 (06/26 0716) BP: (120-146)/(62-86) 135/75 (06/26 0716) SpO2:  [97 %-100 %] 98 % (06/26 0716)  Intake/Output from previous day: 06/25 0701 - 06/26 0700 In: 243 [P.O.:240; I.V.:3] Out: 50 [Drains:50]  Labs: Recent Labs    07/09/19 0506  WBC 15.2*  RBC 4.31  HCT 39.7  PLT 190   No results for input(s): NA, K, CL, CO2, BUN, CREATININE, GLUCOSE, CALCIUM in the last 72 hours. No results for input(s): LABPT, INR in the last 72 hours.  Physical Exam: Neurologically intact ABD soft Neurovascular intact Intact pulses distally Incision: dressing C/D/I and no drainage Compartment soft Abd: positive nausea, negative rebound tenderness Body mass index is 33.29 kg/m.   Assessment/Plan: Patient stable  xrays n/a Continue mobilization with physical therapy Continue care  1. Hold on d/c to home because of nausea.  Encourage ambulation and po intake.  If no nausea then plan on d/c Sunday 2. Teds/SCDs for DVT prevention.  Will continue ted hose and add ASA after discharge 3. Patient appears to be tolerating hydrocodone instead of percocet.  Will adjust d/c meds 4. 10 cc of drain output last shifet (12hrs) - will d/c drain and abxs 5. HCT stable - no evidence of post-op anemia  Melina Schools, MD [facility] [phone]

## 2019-07-09 NOTE — Progress Notes (Signed)
Physical Therapy Treatment Patient Details Name: Veronica Fletcher MRN: [id number] DOB: [birth year] Today's Date: 07/09/2019    History of Present Illness 54 y.o. female presenting with Degenerative lumbar disc disease with lumbar spinal stenosis and radicular leg pain L2-3. Pt. s/p PLIF and L2-3 decompression on 6/24 by Dr. Rolena Infante. PMHx significant for HTN and DM II.     PT Comments    Pt making steady progress with mobility; however, greatly limited this session secondary to nausea. PT reviewed pt education including back precautions and a generalized walking program for pt to initiate upon d/c home. Plan is to d/c home to day with family support.     Follow Up Recommendations  No PT follow up;Supervision for mobility/OOB     Equipment Recommendations  None recommended by PT    Recommendations for Other Services       Precautions / Restrictions Precautions Precautions: Back Precaution Comments: reviewed precautions with pt throughout Required Braces or Orthoses: Spinal Brace Spinal Brace: Lumbar corset;Applied in sitting position Restrictions Weight Bearing Restrictions: No    Mobility  Bed Mobility               General bed mobility comments: Pt was received sitting up in the recliner chair.   Transfers Overall transfer level: Needs assistance Equipment used: Rolling walker (2 wheeled) Transfers: Sit to/from Stand Sit to Stand: Supervision         General transfer comment: good technique utilized  Ambulation/Gait Ambulation/Gait assistance: Supervision Gait Distance (Feet): 20 Feet Assistive device: Rolling walker (2 wheeled) Gait Pattern/deviations: Decreased stride length;Step-through pattern Gait velocity: Decreased   General Gait Details: pt with slow, cautious and guarded gait; very limited secondary to significant nausea   Stairs             Wheelchair Mobility    Modified Rankin (Stroke Patients Only)       Balance Overall balance  assessment: Needs assistance Sitting-balance support: Feet supported Sitting balance-Leahy Scale: Fair     Standing balance support: Bilateral upper extremity supported Standing balance-Leahy Scale: Poor Standing balance comment: reliant on UE support                            Cognition Arousal/Alertness: Awake/alert Behavior During Therapy: WFL for tasks assessed/performed Overall Cognitive Status: Within Functional Limits for tasks assessed                                        Exercises      General Comments        Pertinent Vitals/Pain Pain Assessment: Faces Faces Pain Scale: Hurts even more Pain Location: Low back  Pain Descriptors / Indicators: Aching;Burning;Constant;Guarding;Grimacing Pain Intervention(s): Monitored during session;Repositioned    Home Living                      Prior Function            PT Goals (current goals can now be found in the care plan section) Acute Rehab PT Goals PT Goal Formulation: With patient Time For Goal Achievement: 07/15/19 Potential to Achieve Goals: Good Progress towards PT goals: Progressing toward goals    Frequency    Min 5X/week      PT Plan Current plan remains appropriate    Co-evaluation  AM-PAC PT "6 Clicks" Mobility   Outcome Measure  Help needed turning from your back to your side while in a flat bed without using bedrails?: None Help needed moving from lying on your back to sitting on the side of a flat bed without using bedrails?: None Help needed moving to and from a bed to a chair (including a wheelchair)?: None Help needed standing up from a chair using your arms (e.g., wheelchair or bedside chair)?: None Help needed to walk in hospital room?: None Help needed climbing 3-5 steps with a railing? : A Little 6 Click Score: 23    End of Session Equipment Utilized During Treatment: Back brace Activity Tolerance: Patient limited by  fatigue;Other (comment) (and nausea) Patient left: in chair;with call bell/phone within reach Nurse Communication: Mobility status PT Visit Diagnosis: Unsteadiness on feet (R26.81);Muscle weakness (generalized) (M62.81);Pain Pain - part of body:  (back)     Time: 9675-9163 PT Time Calculation (min) (ACUTE ONLY): 10 min  Charges:  $Gait Training: 8-22 mins                     Anastasio Champion, DPT  Acute Rehabilitation Services Pager [phone] Office [place] 07/09/2019, 8:54 AM

## 2019-07-09 NOTE — Progress Notes (Signed)
Patient is tranfered from room 3C03 to unit 3W14 at this time. Alert and in stable conditio. Report given to receiving nurse Anderson Malta, RN with all questions answered. Left unit via wheelchair with all belongings at side.

## 2019-07-10 LAB — GLUCOSE, CAPILLARY: Glucose-Capillary: 117 mg/dL — ABNORMAL HIGH (ref 70–99)

## 2019-07-10 NOTE — Progress Notes (Signed)
    Subjective: Procedure(s) (LRB): ANTERIOR LATERAL LUMBAR FUSION (XLIF) LUMBAR TWO THROUGH LUMBAR THREE, POSTERIOR SPINAL FUSION INTERBODY LUMBAR TWO THROUGH LUMBAR THREE (N/A) 3 Days Post-Op  Patient reports pain as 2 on 0-10 scale.  Reports decreased leg pain reports incisional back pain   Positive void Negative bowel movement Positive flatus Negative chest pain or shortness of breath  Objective: Vital signs in last 24 hours: Temp:  [98.1 F (36.7 C)-99.5 F (37.5 C)] 99.3 F (37.4 C) (06/27 0755) Pulse Rate:  [100-110] 100 (06/27 0755) Resp:  [14-20] 18 (06/27 0755) BP: (106-138)/(55-72) 121/63 (06/27 0755) SpO2:  [94 %-99 %] 98 % (06/27 0755)  Intake/Output from previous day: No intake/output data recorded.  Labs: Recent Labs    07/09/19 0506  WBC 15.2*  RBC 4.31  HCT 39.7  PLT 190   No results for input(s): NA, K, CL, CO2, BUN, CREATININE, GLUCOSE, CALCIUM in the last 72 hours. No results for input(s): LABPT, INR in the last 72 hours.  Physical Exam: Neurologically intact ABD soft Intact pulses distally Incision: dressing C/D/I and no drainage Compartment soft Body mass index is 33.29 kg/m.   Assessment/Plan: Patient stable  xrays n/a Continue mobilization with physical therapy Continue care  Advance diet Up with therapy  Doing well overall - plan on d/c to home today  Melina Schools, MD [facility] [phone]

## 2019-07-10 NOTE — Progress Notes (Signed)
Physical Therapy Treatment Patient Details Name: Veronica Fletcher MRN: [id number] DOB: 1965/06/26 Today's Date: 07/10/2019    History of Present Illness 54 y.o. female presenting with Degenerative lumbar disc disease with lumbar spinal stenosis and radicular leg pain L2-3. Pt. s/p PLIF and L2-3 decompression on 6/24 by Dr. Rolena Infante. PMHx significant for HTN and DM II.     PT Comments    Patient received up in chair and motivated to participate with PT. Brace applied while in sitting. Use of RW for gait stability - only requires light UE use. Ambulating in hallway with general supervision for safety - no LOB or instability noted. From a mobility standpoint, patient appropriate to return home. PT to follow acutely.     Follow Up Recommendations  No PT follow up;Supervision for mobility/OOB     Equipment Recommendations  None recommended by PT    Recommendations for Other Services       Precautions / Restrictions Precautions Precautions: Back Required Braces or Orthoses: Spinal Brace Spinal Brace: Lumbar corset;Applied in sitting position    Mobility  Bed Mobility               General bed mobility comments: Pt was received sitting up in the recliner chair.   Transfers Overall transfer level: Needs assistance Equipment used: Rolling walker (2 wheeled) Transfers: Sit to/from Stand Sit to Stand: Supervision         General transfer comment: supervision for safety - good awareness to precautions  Ambulation/Gait Ambulation/Gait assistance: Supervision Gait Distance (Feet): 250 Feet Assistive device: Rolling walker (2 wheeled) Gait Pattern/deviations: Step-through pattern;Decreased stride length Gait velocity: Decreased   General Gait Details: slow steady gait - no LOB or instability   Stairs             Wheelchair Mobility    Modified Rankin (Stroke Patients Only)       Balance Overall balance assessment: Needs assistance Sitting-balance support:  Feet supported Sitting balance-Leahy Scale: Good     Standing balance support: Bilateral upper extremity supported Standing balance-Leahy Scale: Fair Standing balance comment: light support from RW                            Cognition Arousal/Alertness: Awake/alert Behavior During Therapy: WFL for tasks assessed/performed Overall Cognitive Status: Within Functional Limits for tasks assessed                                        Exercises      General Comments        Pertinent Vitals/Pain Pain Assessment: 0-10 Pain Score: 2  Pain Location: Low back  Pain Descriptors / Indicators: Aching;Discomfort;Operative site guarding Pain Intervention(s): Limited activity within patient's tolerance;Monitored during session;Repositioned    Home Living                      Prior Function            PT Goals (current goals can now be found in the care plan section) Acute Rehab PT Goals Patient Stated Goal: To return home.  PT Goal Formulation: With patient Time For Goal Achievement: 07/15/19 Potential to Achieve Goals: Good Progress towards PT goals: Progressing toward goals    Frequency    Min 5X/week      PT Plan Current plan remains appropriate    Co-evaluation  AM-PAC PT "6 Clicks" Mobility   Outcome Measure  Help needed turning from your back to your side while in a flat bed without using bedrails?: None Help needed moving from lying on your back to sitting on the side of a flat bed without using bedrails?: None Help needed moving to and from a bed to a chair (including a wheelchair)?: None Help needed standing up from a chair using your arms (e.g., wheelchair or bedside chair)?: None Help needed to walk in hospital room?: None Help needed climbing 3-5 steps with a railing? : A Little 6 Click Score: 23    End of Session Equipment Utilized During Treatment: Back brace Activity Tolerance: Patient tolerated  treatment well Patient left: in chair;with call bell/phone within reach Nurse Communication: Mobility status PT Visit Diagnosis: Unsteadiness on feet (R26.81);Muscle weakness (generalized) (M62.81);Pain Pain - part of body:  (back)     Time: 5462-7035 PT Time Calculation (min) (ACUTE ONLY): 20 min  Charges:  $Gait Training: 8-22 mins                     Milana Na, PT, DPT Supplemental Physical Therapist 07/10/19 9:39 AM Office: [phone]

## 2019-07-10 NOTE — Progress Notes (Signed)
Occupational Therapy Treatment Patient Details Name: Veronica Fletcher MRN: [id number] DOB: Jan 07, 1966 Today's Date: 07/10/2019    History of present illness 54 y.o. female presenting with Degenerative lumbar disc disease with lumbar spinal stenosis and radicular leg pain L2-3. Pt. s/p PLIF and L2-3 decompression on 6/24 by Dr. Rolena Infante. PMHx significant for HTN and DM II.    OT comments  Pt. Seen for skilled OT treatment session. Able to safely demonstrate bed mobility while adhering to back precautions.  ub dressing/lb dressing set up.  toileting and tub transfers also completed at min guard A/S level.  Pt. Reports strong family support available 24/7.  Note d/c likely later today.    Follow Up Recommendations  No OT follow up;Supervision/Assistance - 24 hour    Equipment Recommendations  None recommended by OT;Other (comment)    Recommendations for Other Services      Precautions / Restrictions Precautions Precautions: Back Precaution Comments: reviewed precautions with pt throughout Required Braces or Orthoses: Spinal Brace Spinal Brace: Lumbar corset;Applied in sitting position       Mobility Bed Mobility Overal bed mobility: Needs Assistance Bed Mobility: Rolling;Sidelying to Sit Rolling: Supervision Sidelying to sit: Supervision       General bed mobility comments: able to perform log roll without physical assistance. exits from R side and has a higher bed. bed adjusted to est. height to simulate home env.-HOB flat  Transfers Overall transfer level: Needs assistance Equipment used: Rolling walker (2 wheeled) Transfers: Sit to/from [facility] Sit to Stand: Supervision Stand pivot transfers: Min guard       General transfer comment: supervision for safety - good awareness to precautions    Balance Overall balance assessment: Needs assistance Sitting-balance support: Feet supported Sitting balance-Leahy Scale: Good     Standing balance support:  Bilateral upper extremity supported Standing balance-Leahy Scale: Fair Standing balance comment: light support from RW                           ADL either performed or assessed with clinical judgement   ADL Overall ADL's : Needs assistance/impaired                 Upper Body Dressing : Set up;Sitting Upper Body Dressing Details (indicate cue type and reason): don brace, adjusted in standing states she always does it that way Lower Body Dressing: Set up;Sitting/lateral leans Lower Body Dressing Details (indicate cue type and reason): donned house shoes seated eob. able to slip on due to height of bed higher to simulate home env., also backless so easy on/off Toilet Transfer: Supervision/safety;Ambulation;RW;BSC;Regular Glass blower/designer Details (indicate cue type and reason): 3n1 ove the commode Toileting- Clothing Manipulation and Hygiene: Supervision/safety;Cueing for back precautions;Sit to/from stand Toileting - Clothing Manipulation Details (indicate cue type and reason): "twisted" to flush, educated that she has to make full turn before flushing Tub/ Shower Transfer: Tub transfer;Min guard;Rolling walker;3 in 1 Tub/Shower Transfer Details (indicate cue type and reason): tub transfer for tub combo using shower stall for simulated task. pt. has L faucet. able to clear est. height of tub with education on how to have family members assist with hand placement on RW during side step in/out Functional mobility during ADLs: Supervision/safety;Rolling walker General ADL Comments: pt. moving well.  safely demonstrates donning brace, toileting tasks, and tub transfer.  reports family assistance available 24/7     Vision       Perception     Praxis  Cognition Arousal/Alertness: Awake/alert Behavior During Therapy: WFL for tasks assessed/performed Overall Cognitive Status: Within Functional Limits for tasks assessed                                           Exercises     Shoulder Instructions       General Comments      Pertinent Vitals/ Pain       Pain Assessment: 0-10 Pain Score: 6  Pain Location: Low back  Pain Descriptors / Indicators: Aching;Discomfort;Operative site guarding Pain Intervention(s): Limited activity within patient's tolerance;Monitored during session;Repositioned  Home Living                                          Prior Functioning/Environment              Frequency  Min 2X/week        Progress Toward Goals  OT Goals(current goals can now be found in the care plan section)  Progress towards OT goals: Goals met/education completed, patient discharged from OT  Acute Rehab OT Goals Patient Stated Goal: To return home.   Plan Discharge plan remains appropriate    Co-evaluation                 AM-PAC OT "6 Clicks" Daily Activity     Outcome Measure   Help from another person eating meals?: None Help from another person taking care of personal grooming?: A Little Help from another person toileting, which includes using toliet, bedpan, or urinal?: A Little Help from another person bathing (including washing, rinsing, drying)?: A Little Help from another person to put on and taking off regular upper body clothing?: A Little Help from another person to put on and taking off regular lower body clothing?: A Little 6 Click Score: 19    End of Session Equipment Utilized During Treatment: Gait belt;Rolling walker;Back brace  OT Visit Diagnosis: Unsteadiness on feet (R26.81);Pain   Activity Tolerance Patient tolerated treatment well   Patient Left in chair;with call bell/phone within reach   Nurse Communication          Time: 6838-7065 OT Time Calculation (min): 24 min  Charges: OT General Charges $OT Visit: 1 Visit OT Treatments $Self Care/Home Management : 23-37 mins  Sonia Baller, COTA/L Acute Rehabilitation [phone]   [place], [place] 07/10/2019, 9:46 AM

## 2019-07-10 NOTE — Progress Notes (Signed)
Pt. Given dc instructions and prescriptions. Verbalized understanding. DC'd via wheelchair.

## 2019-07-12 NOTE — Discharge Summary (Signed)
Patient ID: Veronica Fletcher MRN: [id number] DOB/AGE: 1965/06/25 54 y.o.  Admit date: 07/07/2019 Discharge date: 07/12/2019  Admission Diagnoses:  Active Problems:   Spinal stenosis in cervical region   S/P lumbar fusion   Discharge Diagnoses:  Active Problems:   Spinal stenosis in cervical region   S/P lumbar fusion  status post Procedure(s): ANTERIOR LATERAL LUMBAR FUSION (XLIF) LUMBAR TWO THROUGH LUMBAR THREE, POSTERIOR SPINAL FUSION INTERBODY LUMBAR TWO THROUGH LUMBAR THREE  Past Medical History:  Diagnosis Date  . Anemia   . Asthma   . Diabetes mellitus   . Family history of adverse reaction to anesthesia    Brother  . Heart murmur   . Hyperlipidemia   . Hypertension   . Pancreatitis   . Pneumonia     Surgeries: Procedure(s): ANTERIOR LATERAL LUMBAR FUSION (XLIF) LUMBAR TWO THROUGH LUMBAR THREE, POSTERIOR SPINAL FUSION INTERBODY LUMBAR TWO THROUGH LUMBAR THREE on 07/07/2019   Consultants:   Discharged Condition: Improved  Hospital Course: Veronica Fletcher is an 54 y.o. female who was admitted 07/07/2019 for operative treatment of L2-3 degnerative disc disease with radicular pain. Patient failed conservative treatments (please see the history and physical for the specifics) and had severe unremitting pain that affects sleep, daily activities and work/hobbies. After pre-op clearance, the patient was taken to the operating room on 07/07/2019 and underwent  Procedure(s): ANTERIOR LATERAL LUMBAR FUSION (XLIF) LUMBAR TWO THROUGH LUMBAR THREE, POSTERIOR SPINAL FUSION INTERBODY LUMBAR TWO THROUGH LUMBAR THREE.    Patient was given perioperative antibiotics:  Anti-infectives (From admission, onward)   Start     Dose/Rate Route Frequency Ordered Stop   07/08/19 2200  ceFAZolin (ANCEF) IVPB 1 g/50 mL premix  Status:  Discontinued        1 g 100 mL/hr over 30 Minutes Intravenous Every 8 hours 07/08/19 1748 07/09/19 0833   07/08/19 0200  ceFAZolin (ANCEF) IVPB 1 g/50 mL premix         1 g 100 mL/hr over 30 Minutes Intravenous Every 8 hours 07/07/19 2106 07/08/19 1030   07/07/19 1036  ceFAZolin (ANCEF) 2-4 GM/100ML-% IVPB       Note to Pharmacy: Tressia Miners Ch: cabinet override      07/07/19 1036 07/07/19 1454   07/07/19 1023  ceFAZolin (ANCEF) IVPB 2g/100 mL premix        2 g 200 mL/hr over 30 Minutes Intravenous 30 min pre-op 07/07/19 1023 07/07/19 1815       Patient was given sequential compression devices and early ambulation to prevent DVT.   Patient benefited maximally from hospital stay and there were no complications. At the time of discharge, the patient was urinating/moving their bowels without difficulty, tolerating a regular diet, pain is controlled with oral pain medications and they have been cleared by PT/OT.   Recent vital signs: No data found.   Recent laboratory studies: No results for input(s): WBC, HGB, HCT, PLT, NA, K, CL, CO2, BUN, CREATININE, GLUCOSE, INR, CALCIUM in the last 72 hours.  Invalid input(s): PT, 2   Discharge Medications:   Allergies as of 07/10/2019      Reactions   Liraglutide Other (See Comments)   Other reaction(s): Pancreatitis *Victoza       Medication List    STOP taking these medications   Apple Cider Vinegar 500 MG Tabs   Biofreeze Roll-On 4 % Gel Generic drug: Menthol (Topical Analgesic)   celecoxib 200 MG capsule Commonly known as: CELEBREX   diphenhydramine-acetaminophen 25-500 MG Tabs tablet Commonly known  as: TYLENOL PM   GOODY PM PO   multivitamin with minerals tablet   naproxen sodium 220 MG tablet Commonly known as: ALEVE   traZODone 50 MG tablet Commonly known as: DESYREL   TURMERIC PO     TAKE these medications   Accu-Chek FastClix Lancets Misc 1 each by Does not apply route 2 (two) times a day. Use Accu Chek Fastclix lancets to check blood sugar twice daily.   Accu-Chek Guide test strip Generic drug: glucose blood Use Accu Chek Guide test strips as instructed to check  blood sugar twice daily.   Accu-Chek Guide w/Device Kit 1 each by Does not apply route 2 (two) times a day. Use as instructed to check blood sugar twice daily.   albuterol 108 (90 Base) MCG/ACT inhaler Commonly known as: VENTOLIN HFA Inhale 1-2 puffs into the lungs every 4 (four) hours as needed for wheezing or shortness of breath.   amLODipine 5 MG tablet Commonly known as: NORVASC Take 5 mg by mouth daily.   aspirin EC 81 MG tablet Take 81 mg by mouth daily. Swallow whole.   atorvastatin 10 MG tablet Commonly known as: LIPITOR TAKE 1 TABLET (10 MG TOTAL) BY MOUTH DAILY AT 6 PM.   Farxiga 5 MG Tabs tablet Generic drug: dapagliflozin propanediol TAKE 1 TABLET BY MOUTH EVERY DAY What changed: how much to take   fluticasone 50 MCG/ACT nasal spray Commonly known as: FLONASE Place 1 spray into both nostrils daily as needed for allergies or rhinitis.   gabapentin 300 MG capsule Commonly known as: NEURONTIN Take 300 mg by mouth at bedtime.   metFORMIN 500 MG 24 hr tablet Commonly known as: GLUCOPHAGE-XR Take 2 tablets (1,000 mg total) by mouth daily with supper. What changed:   when to take this  reasons to take this   methocarbamol 500 MG tablet Commonly known as: Robaxin Take 1 tablet (500 mg total) by mouth every 8 (eight) hours as needed for up to 5 days for muscle spasms.   NovoLOG Mix 70/30 FlexPen (70-30) 100 UNIT/ML FlexPen Generic drug: insulin aspart protamine - aspart INJECT (16-18 UNITS TOTAL) INTO THE SKIN SEE ADMIN INSTRUCTIONS. 18 UNITS IN THE MORNING AND 16 UNITS AT NIGHT What changed: See the new instructions.   ondansetron 4 MG tablet Commonly known as: Zofran Take 1 tablet (4 mg total) by mouth every 8 (eight) hours as needed for nausea or vomiting.   oxyCODONE-acetaminophen 10-325 MG tablet Commonly known as: Percocet Take 1 tablet by mouth every 6 (six) hours as needed for up to 5 days for pain.   Ozempic (1 MG/DOSE) 4 MG/3ML Sopn Generic  drug: Semaglutide (1 MG/DOSE) Inject 1 mg into the skin once a week. What changed: additional instructions   Pen Needles 30G X 5 MM Misc 1 each by Does not apply route 2 (two) times daily. Use to inject insulin twice daily.   ramipril 10 MG capsule Commonly known as: ALTACE Take 10 mg by mouth daily.   topiramate 50 MG tablet Commonly known as: TOPAMAX Take 25 mg by mouth at bedtime.       Diagnostic Studies: DG Chest 2 View  Result Date: 07/06/2019 CLINICAL DATA:  Preoperative examination. EXAM: CHEST - 2 VIEW COMPARISON:  None. FINDINGS: Minimal linear atelectasis or scar is seen in the right lung base. Lungs otherwise clear. Heart size normal. No pneumothorax or pleural fluid. No acute or focal bony abnormality. Cholecystectomy clips noted. IMPRESSION: No acute disease. Electronically Signed   By:  Inge Rise M.D.   On: 07/06/2019 13:04   DG Lumbar Spine 2-3 Views  Result Date: 07/07/2019 CLINICAL DATA:  Lumbar fusion EXAM: LUMBAR SPINE - 2-3 VIEW; DG C-ARM 1-60 MIN COMPARISON:  CT 04/14/2018 FINDINGS: Four low resolution intraoperative spot views of the lumbar spine. Total fluoroscopy time was 24 seconds. The images demonstrate post fusion changes at what appears to be L2 and L3. IMPRESSION: Intraoperative fluoroscopic assistance provided during lumbar spine surgery. Electronically Signed   By: Donavan Foil M.D.   On: 07/07/2019 19:54   DG C-Arm 1-60 Min  Result Date: 07/07/2019 CLINICAL DATA:  Lumbar fusion EXAM: LUMBAR SPINE - 2-3 VIEW; DG C-ARM 1-60 MIN COMPARISON:  CT 04/14/2018 FINDINGS: Four low resolution intraoperative spot views of the lumbar spine. Total fluoroscopy time was 24 seconds. The images demonstrate post fusion changes at what appears to be L2 and L3. IMPRESSION: Intraoperative fluoroscopic assistance provided during lumbar spine surgery. Electronically Signed   By: Donavan Foil M.D.   On: 07/07/2019 19:54   DG OR LOCAL ABDOMEN  Result Date:  07/07/2019 CLINICAL DATA:  Evaluation for retained instruments. EXAM: OR LOCAL ABDOMEN COMPARISON:  None FINDINGS: There are no retained surgical instruments at the site of interbody fusion performed in the lumbar spine. IMPRESSION: No retained surgical instruments. Electronically Signed   By: Lorriane Shire M.D.   On: 07/07/2019 16:23    Discharge Instructions    Incentive spirometry RT   Complete by: As directed        Follow-up Information    Melina Schools, MD. Schedule an appointment as soon as possible for a visit in 2 weeks.   Specialty: Orthopedic Surgery Why: If symptoms worsen, For suture removal, For wound re-check Contact information: [address] [place] [address] [phone]               Discharge Plan:  discharge to home  Disposition: stable    Signed: Yvonne Kendall Alee Gressman for [facility] PA-C [facility] [phone] 07/12/2019, 1:29 PM

## 2019-07-22 ENCOUNTER — Telehealth: Payer: Self-pay | Admitting: Endocrinology

## 2019-07-22 NOTE — Telephone Encounter (Signed)
She will not need to come for labs but make sure she is checking her blood sugars by rotation before and after meals

## 2019-07-22 NOTE — Telephone Encounter (Signed)
Called pt and gave her MD message. Pt verbalized understanding. 

## 2019-07-22 NOTE — Telephone Encounter (Signed)
You have A1c and BMP ordered. Last BMP from hospital was on 07/05/19. Is this okay for you?

## 2019-07-22 NOTE — Telephone Encounter (Signed)
Patient called stating she just had labs done at [facility] a week or 2 ago and asked if she needed to still come for labs on 07/26/19 prior to her follow up with Dr Dwyane Dee on 07/29/19.

## 2019-07-26 ENCOUNTER — Other Ambulatory Visit: Payer: BC Managed Care – PPO

## 2019-07-27 ENCOUNTER — Other Ambulatory Visit: Payer: Self-pay | Admitting: Endocrinology

## 2019-07-29 ENCOUNTER — Other Ambulatory Visit: Payer: Self-pay

## 2019-07-29 ENCOUNTER — Encounter: Payer: Self-pay | Admitting: Endocrinology

## 2019-07-29 ENCOUNTER — Ambulatory Visit (INDEPENDENT_AMBULATORY_CARE_PROVIDER_SITE_OTHER): Payer: BC Managed Care – PPO | Admitting: Endocrinology

## 2019-07-29 VITALS — BP 124/78 | HR 98 | Ht 62.0 in | Wt 180.0 lb

## 2019-07-29 DIAGNOSIS — E669 Obesity, unspecified: Secondary | ICD-10-CM

## 2019-07-29 DIAGNOSIS — E7800 Pure hypercholesterolemia, unspecified: Secondary | ICD-10-CM

## 2019-07-29 DIAGNOSIS — E1169 Type 2 diabetes mellitus with other specified complication: Secondary | ICD-10-CM | POA: Diagnosis not present

## 2019-07-29 LAB — GLUCOSE, POCT (MANUAL RESULT ENTRY): POC Glucose: 198 mg/dl — AB (ref 70–99)

## 2019-07-29 NOTE — Progress Notes (Signed)
Patient ID: Veronica Fletcher, female   DOB: 09/21/1965, 54 y.o.   MRN: [id number]           Reason for Appointment: Follow-up for Type 2 Diabetes  Referring physician: Harrington Challenger  History of Present Illness:          Date of diagnosis of type 2 diabetes mellitus: 2005       Background history:  She had been started on metformin initially and not clear what other treatment she had taken subsequently, records are being awaited today She apparently was doing very well with a combination of Victoza and metformin until 2015 and she thinks she was losing weight with this.  She had taken Victoza for several months at least However according to his lab records her best A1c in 2014 was only 7.9 Apparently she was taken off Victoza when she was admitted for abdominal pain presumed to be from pancreatitis in 01/2013 She was then started on premixed insulin and taken off metformin also on discharge. She was referred here for poorly controlled diabetes with A1c consistently around 10% On her initial consultation she was given metformin and Jardiance in addition to her insulin  V-go pump 20 unit basal and boluses 4 units previously stopped  Recent history:   INSULIN regimen is: Novolog Mix at meals: 18 a.m.-16 before dinner   Non-insulin hypoglycemic drugs: Farxiga 10 mg daily, metformin ER 1000 mg daily Ozempic 1.0 mg weekly  Her A1c is slightly better at 6.5   Current management, blood sugar patterns and problems identified:  She has again not checked her sugar very regularly  However she has fairly good readings at different times of the day with only 1 relatively high readings after breakfast  She has not been able to do any walking since she is still recovering from her back surgery  Weight has leveled off  No side effects from [place] for [place] which she takes regularly  Her dose of insulin has been fairly consistent  No hypoglycemia reported overnight  She thinks she is planning her  meals fairly well   Side effects from medications have been:?  Victoza caused pancreatitis  Compliance with the medical regimen: Good   Glucose monitoring:  done 0.5 times a day         Glucometer:  Verio   Blood Glucose readings by meter download   PRE-MEAL Fasting Lunch Dinner Bedtime Overall  Glucose range:  106-137   109-158  71, 141   Mean/median:     139   POST-MEAL PC Breakfast PC Lunch PC Dinner  Glucose range: -201   126, 152  Mean/median:      Previous readings  PRE-MEAL  morning Lunch Dinner Bedtime Overall  Glucose range:  70-176   103  100   Mean/median:  140     135   POST-MEAL PC Breakfast PC Lunch PC Dinner  Glucose range:    163  Mean/median:        :Self-care: The diet that the patient has been following is: tries to limit portions.      Typical meal intake: Breakfast is oatmeal/yogurt, lunch is a grilled chicken, evening meal is meat and 2 vegetables, will have snacks on yogurt    Dinner at 6 pm           Dietician visit, most recent:2014                Weight history:   Wt Readings from Last 3 Encounters:  07/29/19 180 lb (81.6 kg)  07/07/19 182 lb (82.6 kg)  07/05/19 182 lb 4.8 oz (82.7 kg)    Glycemic control:   Lab Results  Component Value Date   HGBA1C 6.5 (H) 07/07/2019   HGBA1C 6.7 (H) 03/22/2019   HGBA1C 6.3 12/21/2018   Lab Results  Component Value Date   MICROALBUR <0.7 09/29/2018   LDLCALC 64 03/22/2019   CREATININE 0.69 07/05/2019   Lab Results  Component Value Date   MICRALBCREAT 0.9 09/29/2018    Lab Results  Component Value Date   FRUCTOSAMINE 293 (H) 03/22/2018   FRUCTOSAMINE 250 08/12/2017   FRUCTOSAMINE 290 (H) 06/16/2017   FRUCTOSAMINE 284 10/22/2016     No visits with results within 1 Week(s) from this visit.  Latest known visit with results is:  Admission on 07/07/2019, Discharged on 07/10/2019  Component Date Value Ref Range Status  . Glucose-Capillary 07/07/2019 146* 70 - 99 mg/dL Final    Glucose reference range applies only to samples taken after fasting for at least 8 hours.  . Glucose-Capillary 07/07/2019 113* 70 - 99 mg/dL Final   Glucose reference range applies only to samples taken after fasting for at least 8 hours.  . Glucose-Capillary 07/07/2019 173* 70 - 99 mg/dL Final   Glucose reference range applies only to samples taken after fasting for at least 8 hours.  . Hgb A1c MFr Bld 07/07/2019 6.5* 4.8 - 5.6 % Final   Comment: (NOTE) Pre diabetes:          5.7%-6.4%  Diabetes:              >6.4%  Glycemic control for   <7.0% adults with diabetes   . Mean Plasma Glucose 07/07/2019 139.85  mg/dL Final   Performed at [facility] [address] [place], [place] [address]  . Glucose-Capillary 07/07/2019 263* 70 - 99 mg/dL Final   Glucose reference range applies only to samples taken after fasting for at least 8 hours.  . Comment 1 07/07/2019 Notify RN   Final  . Comment 2 07/07/2019 Document in Chart   Final  . Glucose-Capillary 07/08/2019 197* 70 - 99 mg/dL Final   Glucose reference range applies only to samples taken after fasting for at least 8 hours.  . Comment 1 07/08/2019 Notify RN   Final  . Comment 2 07/08/2019 Document in Chart   Final  . Glucose-Capillary 07/08/2019 183* 70 - 99 mg/dL Final   Glucose reference range applies only to samples taken after fasting for at least 8 hours.  . Glucose-Capillary 07/08/2019 100* 70 - 99 mg/dL Final   Glucose reference range applies only to samples taken after fasting for at least 8 hours.  . WBC 07/09/2019 15.2* 4.0 - 10.5 K/uL Final  . RBC 07/09/2019 4.31  3.87 - 5.11 MIL/uL Final  . Hemoglobin 07/09/2019 12.9  12.0 - 15.0 g/dL Final  . HCT 07/09/2019 39.7  36 - 46 % Final  . MCV 07/09/2019 92.1  80.0 - 100.0 fL Final  . MCH 07/09/2019 29.9  26.0 - 34.0 pg Final  . MCHC 07/09/2019 32.5  30.0 - 36.0 g/dL Final  . RDW 07/09/2019 13.5  11.5 - 15.5 % Final  . Platelets 07/09/2019 190  150 - 400 K/uL Final  .  nRBC 07/09/2019 0.0  0.0 - 0.2 % Final   Performed at [facility], [place] [address], [place], [place] [address]  . Glucose-Capillary 07/08/2019 60* 70 - 99 mg/dL Final  Glucose reference range applies only to samples taken after fasting for at least 8 hours.  . Glucose-Capillary 07/08/2019 71  70 - 99 mg/dL Final   Glucose reference range applies only to samples taken after fasting for at least 8 hours.  . Comment 1 07/08/2019 Notify RN   Final  . Comment 2 07/08/2019 Document in Chart   Final  . Glucose-Capillary 07/08/2019 74  70 - 99 mg/dL Final   Glucose reference range applies only to samples taken after fasting for at least 8 hours.  . Comment 1 07/08/2019 Notify RN   Final  . Comment 2 07/08/2019 Document in Chart   Final  . Glucose-Capillary 07/09/2019 134* 70 - 99 mg/dL Final   Glucose reference range applies only to samples taken after fasting for at least 8 hours.  . Comment 1 07/09/2019 Notify RN   Final  . Comment 2 07/09/2019 Document in Chart   Final  . Glucose-Capillary 07/09/2019 129* 70 - 99 mg/dL Final   Glucose reference range applies only to samples taken after fasting for at least 8 hours.  . Comment 1 07/09/2019 Notify RN   Final  . Comment 2 07/09/2019 Document in Chart   Final  . Glucose-Capillary 07/09/2019 157* 70 - 99 mg/dL Final   Glucose reference range applies only to samples taken after fasting for at least 8 hours.  . Glucose-Capillary 07/09/2019 89  70 - 99 mg/dL Final   Glucose reference range applies only to samples taken after fasting for at least 8 hours.  . Glucose-Capillary 07/10/2019 117* 70 - 99 mg/dL Final   Glucose reference range applies only to samples taken after fasting for at least 8 hours.      Allergies as of 07/29/2019      Reactions   Liraglutide Other (See Comments)   Other reaction(s): Pancreatitis *Victoza       Medication List       Accurate as of July 29, 2019 11:39 AM. If you have any questions, ask your nurse or  doctor.        Accu-Chek FastClix Lancets Misc 1 each by Does not apply route 2 (two) times a day. Use Accu Chek Fastclix lancets to check blood sugar twice daily.   Accu-Chek Guide test strip Generic drug: glucose blood Use Accu Chek Guide test strips as instructed to check blood sugar twice daily.   Accu-Chek Guide w/Device Kit 1 each by Does not apply route 2 (two) times a day. Use as instructed to check blood sugar twice daily.   albuterol 108 (90 Base) MCG/ACT inhaler Commonly known as: VENTOLIN HFA Inhale 1-2 puffs into the lungs every 4 (four) hours as needed for wheezing or shortness of breath.   amLODipine 5 MG tablet Commonly known as: NORVASC Take 5 mg by mouth daily.   aspirin EC 81 MG tablet Take 81 mg by mouth daily. Swallow whole.   atorvastatin 10 MG tablet Commonly known as: LIPITOR TAKE 1 TABLET (10 MG TOTAL) BY MOUTH DAILY AT 6 PM.   Farxiga 5 MG Tabs tablet Generic drug: dapagliflozin propanediol TAKE 1 TABLET BY MOUTH EVERY DAY What changed: how much to take   fluticasone 50 MCG/ACT nasal spray Commonly known as: FLONASE Place 1 spray into both nostrils daily as needed for allergies or rhinitis.   gabapentin 300 MG capsule Commonly known as: NEURONTIN Take 300 mg by mouth at bedtime.   metFORMIN 500 MG 24 hr tablet Commonly known as: GLUCOPHAGE-XR Take 2 tablets (  1,000 mg total) by mouth daily with supper. What changed:   when to take this  reasons to take this   NovoLOG Mix 70/30 FlexPen (70-30) 100 UNIT/ML FlexPen Generic drug: insulin aspart protamine - aspart INJECT (16-18 UNITS TOTAL) INTO THE SKIN SEE ADMIN INSTRUCTIONS. 18 UNITS IN THE MORNING AND 16 UNITS AT NIGHT What changed: See the new instructions.   ondansetron 4 MG tablet Commonly known as: Zofran Take 1 tablet (4 mg total) by mouth every 8 (eight) hours as needed for nausea or vomiting.   Ozempic (1 MG/DOSE) 4 MG/3ML Sopn Generic drug: Semaglutide (1 MG/DOSE) INJECT 1  MG INTO THE SKIN ONCE A WEEK.   Pen Needles 30G X 5 MM Misc 1 each by Does not apply route 2 (two) times daily. Use to inject insulin twice daily.   ramipril 10 MG capsule Commonly known as: ALTACE Take 10 mg by mouth daily.   topiramate 50 MG tablet Commonly known as: TOPAMAX Take 25 mg by mouth at bedtime.       Allergies:  Allergies  Allergen Reactions  . Liraglutide Other (See Comments)    Other reaction(s): Pancreatitis  *Victoza     Past Medical History:  Diagnosis Date  . Anemia   . Asthma   . Diabetes mellitus   . Family history of adverse reaction to anesthesia    Brother  . Heart murmur   . Hyperlipidemia   . Hypertension   . Pancreatitis   . Pneumonia     Past Surgical History:  Procedure Laterality Date  . ABDOMINAL HYSTERECTOMY    . ANTERIOR LAT LUMBAR FUSION N/A 07/07/2019   Procedure: ANTERIOR LATERAL LUMBAR FUSION (XLIF) LUMBAR TWO THROUGH LUMBAR THREE, POSTERIOR SPINAL FUSION INTERBODY LUMBAR TWO THROUGH LUMBAR THREE;  Surgeon: Melina Schools, MD;  Location: [facility];  Service: Orthopedics;  Laterality: N/A;  4 HRS  . CESAREAN SECTION    . CHOLECYSTECTOMY    . GANGLION CYST EXCISION    . HIP CAPSULECTOMY Right 10/14/2018   Procedure: RIGHT HIP HETEROTOPIC OSSIFICATION RESECTION;  Surgeon: Shona Needles, MD;  Location: [facility];  Service: Orthopedics;  Laterality: Right;  . LAPAROSCOPY      Family History  Problem Relation Age of Onset  . Diabetes Mother   . Hypertension Mother   . Diabetes Maternal Aunt   . Diabetes Paternal Aunt   . Diabetes Maternal Grandmother   . Heart disease Neg Hx     Social History:  reports that she quit smoking about 6 years ago. Her smoking use included cigarettes. She smoked 0.50 packs per day. She has never used smokeless tobacco. She reports current alcohol use. She reports that she does not use drugs.    Review of Systems    Lipid history: on Lipitor 10 mg She is taking it regularly with following  results    Lab Results  Component Value Date   CHOL 128 03/22/2019   HDL 48.10 03/22/2019   LDLCALC 64 03/22/2019   LDLDIRECT 75.0 03/22/2018   TRIG 80.0 03/22/2019   CHOLHDL 3 03/22/2019           Hypertension:  followed by PCP, currently on 31m amlodipine and 5 mg ramipril  This is followed by PCP    BP Readings from Last 3 Encounters:  07/29/19 124/78  07/10/19 121/63  07/05/19 (!) 142/74     Most recent foot exam: March 2021   Review of Systems    Physical Examination:  BP 124/78 (BP  Location: Left Arm, Patient Position: Sitting, Cuff Size: Large)   Pulse 98   Ht 5' 2"  (1.575 m)   Wt 180 lb (81.6 kg)   LMP 07/09/2011   SpO2 98%   BMI 32.92 kg/m    ASSESSMENT:  Diabetes type 2, insulin-dependent  See history of present illness for detailed discussion of current diabetes management, blood sugar patterns and problems identified  She is on Ozempic, premixed insulin twice a day, Farxiga and Metformin  Her A1c has improved to 6.5 and usually stable  Overall blood sugars are well controlled consistently now She has only mild fluctuation in blood sugar despite taking premixed insulin twice a day   HYPERLIPIDEMIA: Will need to follow-up on the next visit  HYPERTENSION: Blood pressure is well controlled, does need follow-up urine microalbumin on the next visit   PLAN:    No change in current regimen She will call us if she has any consistently high or low readings  She will try to start walking when she is able to    Follow-up in 4 months    There are no Patient Instructions on file for this visit.    Elayne Snare 07/29/2019, 11:39 AM   Note: This office note was prepared with Dragon voice recognition system technology. Any transcriptional errors that result from this process are unintentional.

## 2019-07-29 NOTE — Addendum Note (Signed)
Addended by: Gae Dry D on: 07/29/2019 11:52 AM   Modules accepted: Orders

## 2019-08-12 ENCOUNTER — Other Ambulatory Visit: Payer: Self-pay | Admitting: Endocrinology

## 2019-08-12 ENCOUNTER — Other Ambulatory Visit: Payer: Self-pay | Admitting: *Deleted

## 2019-08-12 MED ORDER — OZEMPIC (1 MG/DOSE) 4 MG/3ML ~~LOC~~ SOPN: 1.0000 mg | PEN_INJECTOR | SUBCUTANEOUS | 0 refills | Status: DC

## 2019-08-15 ENCOUNTER — Other Ambulatory Visit: Payer: Self-pay

## 2019-08-15 DIAGNOSIS — E1165 Type 2 diabetes mellitus with hyperglycemia: Secondary | ICD-10-CM

## 2019-08-15 DIAGNOSIS — Z794 Long term (current) use of insulin: Secondary | ICD-10-CM

## 2019-08-15 NOTE — Telephone Encounter (Signed)
   Please review above and confirm correct dosage.

## 2019-08-16 ENCOUNTER — Other Ambulatory Visit: Payer: Self-pay

## 2019-08-16 DIAGNOSIS — E1165 Type 2 diabetes mellitus with hyperglycemia: Secondary | ICD-10-CM

## 2019-08-16 DIAGNOSIS — Z794 Long term (current) use of insulin: Secondary | ICD-10-CM

## 2019-08-16 MED ORDER — METFORMIN HCL ER 500 MG PO TB24: 1000.0000 mg | ORAL_TABLET | Freq: Every day | ORAL | 0 refills | Status: DC

## 2019-08-16 NOTE — Telephone Encounter (Signed)
Outpatient Medication Detail   Disp Refills Start End   metFORMIN (GLUCOPHAGE-XR) 500 MG 24 hr tablet 180 tablet 0 08/16/2019    Sig - Route: Take 2 tablets (1,000 mg total) by mouth daily. - Oral   Sent to pharmacy as: metFORMIN (GLUCOPHAGE-XR) 500 MG 24 hr tablet   E-Prescribing Status: Receipt confirmed by pharmacy (08/16/2019  7:33 AM EDT)

## 2019-08-19 DIAGNOSIS — M5136 Other intervertebral disc degeneration, lumbar region: Secondary | ICD-10-CM | POA: Diagnosis not present

## 2019-08-23 DIAGNOSIS — Z4889 Encounter for other specified surgical aftercare: Secondary | ICD-10-CM | POA: Diagnosis not present

## 2019-09-05 DIAGNOSIS — M5416 Radiculopathy, lumbar region: Secondary | ICD-10-CM | POA: Diagnosis not present

## 2019-09-09 DIAGNOSIS — M5416 Radiculopathy, lumbar region: Secondary | ICD-10-CM | POA: Diagnosis not present

## 2019-09-12 DIAGNOSIS — M5416 Radiculopathy, lumbar region: Secondary | ICD-10-CM | POA: Diagnosis not present

## 2019-09-17 ENCOUNTER — Other Ambulatory Visit: Payer: Self-pay | Admitting: Endocrinology

## 2019-09-20 DIAGNOSIS — M5416 Radiculopathy, lumbar region: Secondary | ICD-10-CM | POA: Diagnosis not present

## 2019-09-23 DIAGNOSIS — M5416 Radiculopathy, lumbar region: Secondary | ICD-10-CM | POA: Diagnosis not present

## 2019-09-30 DIAGNOSIS — M5416 Radiculopathy, lumbar region: Secondary | ICD-10-CM | POA: Diagnosis not present

## 2019-10-03 DIAGNOSIS — M5416 Radiculopathy, lumbar region: Secondary | ICD-10-CM | POA: Diagnosis not present

## 2019-10-04 DIAGNOSIS — Z981 Arthrodesis status: Secondary | ICD-10-CM | POA: Diagnosis not present

## 2019-10-28 ENCOUNTER — Other Ambulatory Visit: Payer: Self-pay | Admitting: Endocrinology

## 2019-10-30 MED ORDER — DAPAGLIFLOZIN PROPANEDIOL 5 MG PO TABS: 5.0000 mg | ORAL_TABLET | Freq: Every day | ORAL | 2 refills | Status: DC

## 2019-10-30 NOTE — Addendum Note (Signed)
Addended by: Amado Coe on: 10/30/2019 04:46 PM   Modules accepted: Orders

## 2019-11-29 ENCOUNTER — Other Ambulatory Visit (INDEPENDENT_AMBULATORY_CARE_PROVIDER_SITE_OTHER): Payer: BC Managed Care – PPO

## 2019-11-29 ENCOUNTER — Other Ambulatory Visit: Payer: Self-pay

## 2019-11-29 ENCOUNTER — Other Ambulatory Visit: Payer: Self-pay | Admitting: Endocrinology

## 2019-11-29 DIAGNOSIS — E1165 Type 2 diabetes mellitus with hyperglycemia: Secondary | ICD-10-CM

## 2019-11-29 DIAGNOSIS — E7800 Pure hypercholesterolemia, unspecified: Secondary | ICD-10-CM | POA: Diagnosis not present

## 2019-11-29 DIAGNOSIS — E669 Obesity, unspecified: Secondary | ICD-10-CM | POA: Diagnosis not present

## 2019-11-29 DIAGNOSIS — E1169 Type 2 diabetes mellitus with other specified complication: Secondary | ICD-10-CM | POA: Diagnosis not present

## 2019-11-29 DIAGNOSIS — Z794 Long term (current) use of insulin: Secondary | ICD-10-CM | POA: Diagnosis not present

## 2019-11-29 LAB — LIPID PANEL
Cholesterol: 153 mg/dL (ref 0–200)
HDL: 54.3 mg/dL (ref >39.00)
LDL Cholesterol: 82 mg/dL (ref 0–99)
NonHDL: 98.43
Total CHOL/HDL Ratio: 3
Triglycerides: 81 mg/dL (ref 0.0–149.0)
VLDL: 16.2 mg/dL (ref 0.0–40.0)

## 2019-11-29 LAB — COMPREHENSIVE METABOLIC PANEL
ALT: 11 U/L (ref 0–35)
AST: 17 U/L (ref 0–37)
Albumin: 4.3 g/dL (ref 3.5–5.2)
Alkaline Phosphatase: 82 U/L (ref 39–117)
BUN: 13 mg/dL (ref 6–23)
CO2: 28 mEq/L (ref 19–32)
Calcium: 9.1 mg/dL (ref 8.4–10.5)
Chloride: 104 mEq/L (ref 96–112)
Creatinine, Ser: 0.74 mg/dL (ref 0.40–1.20)
GFR: 91.83 mL/min (ref >60.00)
Glucose, Bld: 92 mg/dL (ref 70–99)
Potassium: 3.4 mEq/L — ABNORMAL LOW (ref 3.5–5.1)
Sodium: 140 mEq/L (ref 135–145)
Total Bilirubin: 0.5 mg/dL (ref 0.2–1.2)
Total Protein: 7.6 g/dL (ref 6.0–8.3)

## 2019-11-29 LAB — MICROALBUMIN / CREATININE URINE RATIO
Creatinine,U: 81.8 mg/dL
Microalb Creat Ratio: 0.9 mg/g (ref 0.0–30.0)
Microalb, Ur: <0.7 mg/dL (ref 0.0–1.9)

## 2019-11-29 LAB — HEMOGLOBIN A1C: Hgb A1c MFr Bld: 6.7 % — ABNORMAL HIGH (ref 4.6–6.5)

## 2019-12-02 ENCOUNTER — Ambulatory Visit (INDEPENDENT_AMBULATORY_CARE_PROVIDER_SITE_OTHER): Payer: BC Managed Care – PPO | Admitting: Endocrinology

## 2019-12-02 ENCOUNTER — Other Ambulatory Visit: Payer: Self-pay

## 2019-12-02 ENCOUNTER — Other Ambulatory Visit: Payer: Self-pay | Admitting: Endocrinology

## 2019-12-02 ENCOUNTER — Encounter: Payer: Self-pay | Admitting: Endocrinology

## 2019-12-02 VITALS — BP 118/78 | HR 64 | Ht 62.0 in | Wt 187.2 lb

## 2019-12-02 DIAGNOSIS — I10 Essential (primary) hypertension: Secondary | ICD-10-CM

## 2019-12-02 DIAGNOSIS — E7800 Pure hypercholesterolemia, unspecified: Secondary | ICD-10-CM | POA: Diagnosis not present

## 2019-12-02 DIAGNOSIS — E876 Hypokalemia: Secondary | ICD-10-CM

## 2019-12-02 DIAGNOSIS — Z794 Long term (current) use of insulin: Secondary | ICD-10-CM

## 2019-12-02 DIAGNOSIS — E1165 Type 2 diabetes mellitus with hyperglycemia: Secondary | ICD-10-CM

## 2019-12-02 MED ORDER — DAPAGLIFLOZIN PROPANEDIOL 10 MG PO TABS: 10.0000 mg | ORAL_TABLET | Freq: Every day | ORAL | 3 refills | Status: DC

## 2019-12-02 NOTE — Progress Notes (Signed)
Patient ID: Veronica Fletcher, female   DOB: [birth year], 54 y.o.   MRN: [id number]           Reason for Appointment: Follow-up for Type 2 Diabetes  Referring physician: Harrington Challenger  History of Present Illness:          Date of diagnosis of type 2 diabetes mellitus: 2005       Background history:  She had been started on metformin initially and not clear what other treatment she had taken subsequently, records are being awaited today She apparently was doing very well with a combination of Victoza and metformin until 2015 and she thinks she was losing weight with this.  She had taken Victoza for several months at least However according to his lab records her best A1c in 2014 was only 7.9 Apparently she was taken off Victoza when she was admitted for abdominal pain presumed to be from pancreatitis in 01/2013 She was then started on premixed insulin and taken off metformin also on discharge. She was referred here for poorly controlled diabetes with A1c consistently around 10% On her initial consultation she was given metformin and Jardiance in addition to her insulin  V-go pump 20 unit basal and boluses 4 units previously stopped  Recent history:   INSULIN regimen is: Novolog Mix at meals: 18 a.m.-16 before dinner   Non-insulin hypoglycemic drugs: Farxiga 5 mg daily, metformin ER 1000 mg daily Ozempic 1.0 mg weekly  Her A1c is slightly higher at 6.7 compared to 6.5  Current management, blood sugar patterns and problems identified:  She has checked her sugars mostly in the mornings and with her [employer] meter  Her main difficulty is tendency to weight gain and have gone up 7 pounds  This is despite continuing Ozempic  However it appears that she is on 5 mg of Farxiga instead of 10 mg since her hospitalization  Although FASTING blood sugars are fairly good ranging from 110 up to 153 she may have readings as high as 197 after dinner  Taking her insulin before eating consistently  She  thinks that some of her higher postprandial readings are checked within an hour of eating dinner however  Only the last week as start regular walking  She does not think she has gone off her diet   Side effects from medications have been:?  Victoza caused pancreatitis  Compliance with the medical regimen: Good   Glucose monitoring:  done 0.5 times a day         Glucometer: [employer]    Blood Glucose readings by meter review as above   PRE-MEAL Fasting Lunch Dinner Bedtime Overall  Glucose range:  110-153      Mean/median:     ?   POST-MEAL PC Breakfast PC Lunch PC Dinner  Glucose range:   156-197  Mean/median:        Previous readings  PRE-MEAL Fasting Lunch Dinner Bedtime Overall  Glucose range:  106-137   109-158  71, 141   Mean/median:     139   POST-MEAL PC Breakfast PC Lunch PC Dinner  Glucose range: -201   126, 152  Mean/median:        :Self-care: The diet that the patient has been following is: tries to limit portions.      Typical meal intake: Breakfast is oatmeal/yogurt, lunch is a grilled chicken, evening meal is meat and 2 vegetables, will have snacks on yogurt    Dinner at 6 pm  Dietician visit, most recent:2014                Weight history:   Wt Readings from Last 3 Encounters:  12/02/19 187 lb 3.2 oz (84.9 kg)  07/29/19 180 lb (81.6 kg)  07/07/19 182 lb (82.6 kg)    Glycemic control:   Lab Results  Component Value Date   HGBA1C 6.7 (H) 11/29/2019   HGBA1C 6.5 (H) 07/07/2019   HGBA1C 6.7 (H) 03/22/2019   Lab Results  Component Value Date   MICROALBUR <0.7 11/29/2019   LDLCALC 82 11/29/2019   CREATININE 0.74 11/29/2019   Lab Results  Component Value Date   MICRALBCREAT 0.9 11/29/2019    Lab Results  Component Value Date   FRUCTOSAMINE 293 (H) 03/22/2018   FRUCTOSAMINE 250 08/12/2017   FRUCTOSAMINE 290 (H) 06/16/2017   FRUCTOSAMINE 284 10/22/2016     Lab on 11/29/2019  Component Date Value Ref Range Status  .  Microalb, Ur 11/29/2019 <0.7  0.0 - 1.9 mg/dL Final  . Creatinine,U 11/29/2019 81.8  mg/dL Final  . Microalb Creat Ratio 11/29/2019 0.9  0.0 - 30.0 mg/g Final  . Cholesterol 11/29/2019 153  0 - 200 mg/dL Final   ATP III Classification       Desirable:  < 200 mg/dL               Borderline High:  200 - 239 mg/dL          High:  > = 240 mg/dL  . Triglycerides 11/29/2019 81.0  0 - 149 mg/dL Final   Normal:  <150 mg/dLBorderline High:  150 - 199 mg/dL  . HDL 11/29/2019 54.30  >39.00 mg/dL Final  . VLDL 11/29/2019 16.2  0.0 - 40.0 mg/dL Final  . LDL Cholesterol 11/29/2019 82  0 - 99 mg/dL Final  . Total CHOL/HDL Ratio 11/29/2019 3   Final                  Men          Women1/2 Average Risk     3.4          3.3Average Risk          5.0          4.42X Average Risk          9.6          7.13X Average Risk          15.0          11.0                      . NonHDL 11/29/2019 98.43   Final   NOTE:  Non-HDL goal should be 30 mg/dL higher than patient's LDL goal (i.e. LDL goal of < 70 mg/dL, would have non-HDL goal of < 100 mg/dL)  . Sodium 11/29/2019 140  135 - 145 mEq/L Final  . Potassium 11/29/2019 3.4* 3.5 - 5.1 mEq/L Final  . Chloride 11/29/2019 104  96 - 112 mEq/L Final  . CO2 11/29/2019 28  19 - 32 mEq/L Final  . Glucose, Bld 11/29/2019 92  70 - 99 mg/dL Final  . BUN 11/29/2019 13  6 - 23 mg/dL Final  . Creatinine, Ser 11/29/2019 0.74  0.40 - 1.20 mg/dL Final  . Total Bilirubin 11/29/2019 0.5  0.2 - 1.2 mg/dL Final  . Alkaline Phosphatase 11/29/2019 82  39 - 117 U/L Final  . AST 11/29/2019 17  0 -  37 U/L Final  . ALT 11/29/2019 11  0 - 35 U/L Final  . Total Protein 11/29/2019 7.6  6.0 - 8.3 g/dL Final  . Albumin 11/29/2019 4.3  3.5 - 5.2 g/dL Final  . GFR 11/29/2019 91.83  >60.00 mL/min Final   Calculated using the CKD-EPI Creatinine Equation (2021)  . Calcium 11/29/2019 9.1  8.4 - 10.5 mg/dL Final  . Hgb A1c MFr Bld 11/29/2019 6.7* 4.6 - 6.5 % Final   Glycemic Control Guidelines for People  with Diabetes:Non Diabetic:  <6%Goal of Therapy: <7%Additional Action Suggested:  >8%       Allergies as of 12/02/2019      Reactions   Liraglutide Other (See Comments)   Other reaction(s): Pancreatitis *Victoza       Medication List       Accurate as of December 02, 2019  9:09 AM. If you have any questions, ask your nurse or doctor.        Accu-Chek FastClix Lancets Misc 1 each by Does not apply route 2 (two) times a day. Use Accu Chek Fastclix lancets to check blood sugar twice daily.   Accu-Chek Guide test strip Generic drug: glucose blood USE ACCU CHEK GUIDE TEST STRIPS AS INSTRUCTED TO CHECK BLOOD SUGAR TWICE DAILY.   Accu-Chek Guide w/Device Kit 1 each by Does not apply route 2 (two) times a day. Use as instructed to check blood sugar twice daily.   albuterol 108 (90 Base) MCG/ACT inhaler Commonly known as: VENTOLIN HFA Inhale 1-2 puffs into the lungs every 4 (four) hours as needed for wheezing or shortness of breath.   amLODipine 5 MG tablet Commonly known as: NORVASC Take 5 mg by mouth daily.   aspirin EC 81 MG tablet Take 81 mg by mouth daily. Swallow whole.   atorvastatin 10 MG tablet Commonly known as: LIPITOR TAKE 1 TABLET (10 MG TOTAL) BY MOUTH DAILY AT 6 PM.   dapagliflozin propanediol 5 MG Tabs tablet Commonly known as: Farxiga Take 1 tablet (5 mg total) by mouth daily.   fluticasone 50 MCG/ACT nasal spray Commonly known as: FLONASE Place 1 spray into both nostrils daily as needed for allergies or rhinitis.   gabapentin 300 MG capsule Commonly known as: NEURONTIN Take 300 mg by mouth at bedtime.   metFORMIN 500 MG 24 hr tablet Commonly known as: GLUCOPHAGE-XR Take 2 tablets (1,000 mg total) by mouth daily.   NovoLOG Mix 70/30 FlexPen (70-30) 100 UNIT/ML FlexPen Generic drug: insulin aspart protamine - aspart INJECT (16-18 UNITS TOTAL) INTO THE SKIN SEE ADMIN INSTRUCTIONS. 18 UNITS IN THE MORNING AND 16 UNITS AT NIGHT What changed: See the  new instructions.   ondansetron 4 MG tablet Commonly known as: Zofran Take 1 tablet (4 mg total) by mouth every 8 (eight) hours as needed for nausea or vomiting.   Ozempic (1 MG/DOSE) 4 MG/3ML Sopn Generic drug: Semaglutide (1 MG/DOSE) Inject 0.75 mLs (1 mg total) into the skin once a week.   Pen Needles 30G X 5 MM Misc 1 each by Does not apply route 2 (two) times daily. Use to inject insulin twice daily.   ramipril 10 MG capsule Commonly known as: ALTACE Take 10 mg by mouth daily.   topiramate 50 MG tablet Commonly known as: TOPAMAX Take 25 mg by mouth at bedtime.       Allergies:  Allergies  Allergen Reactions  . Liraglutide Other (See Comments)    Other reaction(s): Pancreatitis  *Victoza     Past Medical  History:  Diagnosis Date  . Anemia   . Asthma   . Diabetes mellitus   . Family history of adverse reaction to anesthesia    Brother  . Heart murmur   . Hyperlipidemia   . Hypertension   . Pancreatitis   . Pneumonia     Past Surgical History:  Procedure Laterality Date  . ABDOMINAL HYSTERECTOMY    . ANTERIOR LAT LUMBAR FUSION N/A 07/07/2019   Procedure: ANTERIOR LATERAL LUMBAR FUSION (XLIF) LUMBAR TWO THROUGH LUMBAR THREE, POSTERIOR SPINAL FUSION INTERBODY LUMBAR TWO THROUGH LUMBAR THREE;  Surgeon: Melina Schools, MD;  Location: [place];  Service: Orthopedics;  Laterality: N/A;  4 HRS  . CESAREAN SECTION    . CHOLECYSTECTOMY    . GANGLION CYST EXCISION    . HIP CAPSULECTOMY Right 10/14/2018   Procedure: RIGHT HIP HETEROTOPIC OSSIFICATION RESECTION;  Surgeon: Shona Needles, MD;  Location: [place];  Service: Orthopedics;  Laterality: Right;  . LAPAROSCOPY      Family History  Problem Relation Age of Onset  . Diabetes Mother   . Hypertension Mother   . Diabetes Maternal Aunt   . Diabetes Paternal Aunt   . Diabetes Maternal Grandmother   . Heart disease Neg Hx     Social History:  reports that she quit smoking about 6 years ago. Her smoking use  included cigarettes. She smoked 0.50 packs per day. She has never used smokeless tobacco. She reports current alcohol use. She reports that she does not use drugs.    Review of Systems    Lipid history: on Lipitor 10 mg She is taking it consistently with following results    Lab Results  Component Value Date   CHOL 153 11/29/2019   HDL 54.30 11/29/2019   LDLCALC 82 11/29/2019   LDLDIRECT 75.0 03/22/2018   TRIG 81.0 11/29/2019   CHOLHDL 3 11/29/2019           Hypertension:  followed by PCP, currently on 40m amlodipine and 10 mg ramipril  This is followed by PCP    BP Readings from Last 3 Encounters:  12/02/19 118/78  07/29/19 124/78  07/10/19 121/63   Hypokalemia: Likely why her potassium is low, not on diuretics  Lab Results  Component Value Date   K 3.4 (L) 11/29/2019     Most recent foot exam: March 2021   Review of Systems    Physical Examination:  BP 118/78   Pulse 64   Ht _0  (1.575 m)   Wt 187 lb 3.2 oz (84.9 kg)   LMP 07/09/2011   SpO2 97%   BMI 34.24 kg/m    ASSESSMENT:  Diabetes type 2, insulin-dependent  See history of present illness for detailed discussion of current diabetes management, blood sugar patterns and problems identified  She is on Ozempic, premixed insulin twice a day, Farxiga and Metformin  Her A1c is about the same at 6.7  She has however difficulty losing weight and likely weight gain is related to decreased activity and possibly inconsistent caloric restriction Also may have done better with 10 mg Farxiga in the past Blood sugars do not show any consistent patterns, occasionally high readings after dinner reportedly done within an hour of eating possibly with higher carbohydrate intake No hypoglycemia Again she is taking relatively low doses of insulin for her weight    HYPERLIPIDEMIA: Well-controlled on Lipitor  HYPERTENSION: Blood pressure is well controlled, no microalbuminuria   PLAN:    She will try to  check  more readings after meals including breakfast To check 2 hours after eating Hopefully she can continue her walking program She will increase Farxiga to 10 mg  May also consider continuous glucose monitoring for better assessment of sugars  She can add high potassium foods like bananas instead of potassium supplements, recheck potassium in the next visit along with magnesium  Follow-up in 3 months    There are no Patient Instructions on file for this visit.    Elayne Snare 12/02/2019, 9:09 AM   Note: This office note was prepared with Dragon voice recognition system technology. Any transcriptional errors that result from this process are unintentional.

## 2019-12-02 NOTE — Patient Instructions (Addendum)
Check blood sugars on waking up 3-4 days a week  Also check blood sugars about 2 hours after meals and do this after different meals by rotation  Recommended blood sugar levels on waking up are 90-130 and about 2 hours after meal is 130-160  Please bring your blood sugar monitor to each visit, thank you   

## 2019-12-23 DIAGNOSIS — J019 Acute sinusitis, unspecified: Secondary | ICD-10-CM | POA: Diagnosis not present

## 2019-12-23 DIAGNOSIS — Z03818 Encounter for observation for suspected exposure to other biological agents ruled out: Secondary | ICD-10-CM | POA: Diagnosis not present

## 2019-12-23 DIAGNOSIS — B9689 Other specified bacterial agents as the cause of diseases classified elsewhere: Secondary | ICD-10-CM | POA: Diagnosis not present

## 2019-12-23 DIAGNOSIS — J22 Unspecified acute lower respiratory infection: Secondary | ICD-10-CM | POA: Diagnosis not present

## 2019-12-27 DIAGNOSIS — R059 Cough, unspecified: Secondary | ICD-10-CM | POA: Diagnosis not present

## 2019-12-27 DIAGNOSIS — R0602 Shortness of breath: Secondary | ICD-10-CM | POA: Diagnosis not present

## 2020-01-20 DIAGNOSIS — Z1231 Encounter for screening mammogram for malignant neoplasm of breast: Secondary | ICD-10-CM

## 2020-01-23 DIAGNOSIS — Z981 Arthrodesis status: Secondary | ICD-10-CM | POA: Diagnosis not present

## 2020-02-09 DIAGNOSIS — Z20822 Contact with and (suspected) exposure to covid-19: Secondary | ICD-10-CM | POA: Diagnosis not present

## 2020-02-17 ENCOUNTER — Other Ambulatory Visit: Payer: Self-pay | Admitting: Family Medicine

## 2020-02-17 DIAGNOSIS — N6320 Unspecified lump in the left breast, unspecified quadrant: Secondary | ICD-10-CM | POA: Diagnosis not present

## 2020-02-20 ENCOUNTER — Ambulatory Visit
Admission: RE | Admit: 2020-02-20 | Discharge: 2020-02-20 | Disposition: A | Discharge status: Home / Self Care | Payer: BC Managed Care – PPO | Source: Ambulatory Visit

## 2020-02-20 ENCOUNTER — Other Ambulatory Visit: Payer: Self-pay

## 2020-02-20 DIAGNOSIS — Z1231 Encounter for screening mammogram for malignant neoplasm of breast: Secondary | ICD-10-CM

## 2020-02-21 DIAGNOSIS — M25512 Pain in left shoulder: Secondary | ICD-10-CM | POA: Diagnosis not present

## 2020-02-21 DIAGNOSIS — Z981 Arthrodesis status: Secondary | ICD-10-CM | POA: Diagnosis not present

## 2020-02-22 ENCOUNTER — Other Ambulatory Visit: Payer: Self-pay | Admitting: Family Medicine

## 2020-02-22 ENCOUNTER — Other Ambulatory Visit: Payer: Self-pay | Admitting: *Deleted

## 2020-02-22 DIAGNOSIS — R928 Other abnormal and inconclusive findings on diagnostic imaging of breast: Secondary | ICD-10-CM

## 2020-02-28 DIAGNOSIS — Z1231 Encounter for screening mammogram for malignant neoplasm of breast: Secondary | ICD-10-CM

## 2020-03-05 ENCOUNTER — Ambulatory Visit
Admission: RE | Admit: 2020-03-05 | Discharge: 2020-03-05 | Disposition: A | Discharge status: Home / Self Care | Payer: BC Managed Care – PPO | Source: Ambulatory Visit | Attending: *Deleted | Admitting: *Deleted

## 2020-03-05 ENCOUNTER — Other Ambulatory Visit: Payer: Self-pay

## 2020-03-05 DIAGNOSIS — R922 Inconclusive mammogram: Secondary | ICD-10-CM | POA: Diagnosis not present

## 2020-03-05 DIAGNOSIS — R928 Other abnormal and inconclusive findings on diagnostic imaging of breast: Secondary | ICD-10-CM | POA: Diagnosis not present

## 2020-03-13 ENCOUNTER — Other Ambulatory Visit: Payer: Self-pay

## 2020-03-13 ENCOUNTER — Other Ambulatory Visit (INDEPENDENT_AMBULATORY_CARE_PROVIDER_SITE_OTHER): Payer: BC Managed Care – PPO

## 2020-03-13 DIAGNOSIS — E1165 Type 2 diabetes mellitus with hyperglycemia: Secondary | ICD-10-CM | POA: Diagnosis not present

## 2020-03-13 DIAGNOSIS — Z794 Long term (current) use of insulin: Secondary | ICD-10-CM

## 2020-03-13 DIAGNOSIS — E876 Hypokalemia: Secondary | ICD-10-CM | POA: Diagnosis not present

## 2020-03-13 LAB — BASIC METABOLIC PANEL
BUN: 14 mg/dL (ref 6–23)
CO2: 28 mEq/L (ref 19–32)
Calcium: 9.4 mg/dL (ref 8.4–10.5)
Chloride: 102 mEq/L (ref 96–112)
Creatinine, Ser: 0.73 mg/dL (ref 0.40–1.20)
GFR: 93.16 mL/min (ref >60.00)
Glucose, Bld: 100 mg/dL — ABNORMAL HIGH (ref 70–99)
Potassium: 4.1 mEq/L (ref 3.5–5.1)
Sodium: 139 mEq/L (ref 135–145)

## 2020-03-13 LAB — HEMOGLOBIN A1C: Hgb A1c MFr Bld: 6.6 % — ABNORMAL HIGH (ref 4.6–6.5)

## 2020-03-13 LAB — MAGNESIUM: Magnesium: 2.1 mg/dL (ref 1.5–2.5)

## 2020-03-15 NOTE — Progress Notes (Signed)
Patient ID: Veronica Fletcher, female   DOB: 03-09-1965, 55 y.o.   MRN: [id number]           Reason for Appointment: Follow-up for Type 2 Diabetes  Referring physician: Harrington Challenger  History of Present Illness:          Date of diagnosis of type 2 diabetes mellitus: 2005       Background history:  She had been started on metformin initially and not clear what other treatment she had taken subsequently, records are being awaited today She apparently was doing very well with a combination of Victoza and metformin until 2015 and she thinks she was losing weight with this.  She had taken Victoza for several months at least However according to his lab records her best A1c in 2014 was only 7.9 Apparently she was taken off Victoza when she was admitted for abdominal pain presumed to be from pancreatitis in 01/2013 She was then started on premixed insulin and taken off metformin also on discharge. She was referred here for poorly controlled diabetes with A1c consistently around 10% On her initial consultation she was given metformin and Jardiance in addition to her insulin  V-go pump 20 unit basal and boluses 4 units previously stopped  Recent history:   INSULIN regimen is: Novolog Mix at meals: 18 a.m.-16 before dinner   Non-insulin hypoglycemic drugs: Farxiga 10 mg daily, metformin ER 1000 mg daily Ozempic 1.0 mg weekly  Her A1c is at the same as before at 6.6  Current management, blood sugar patterns and problems identified:  She has finally been able to start losing weight and has gone down 11 pounds  This is likely to be from her having less back pain and trying to be more active  Also usually trying to be planning healthier meals  Has been continuing Ozempic 1 mg weekly with no side effects  [place] was changed back to 10 mg on her visit in 11/21  She did not bring the monitor for download and difficult to assess her blood sugar pattern  However likely has low readings in the mornings  including one reading in the 60s highest about 120 likely  She does not think her blood sugars are more than 140 after meals  Renal function normal again with taking Farxiga   Side effects from medications have been:?  Victoza caused pancreatitis  Compliance with the medical regimen: Good   Glucose monitoring:  done 0.5 times a day         Glucometer:  Accu-Chek guide   Blood Glucose readings as above   Previous readings  PRE-MEAL Fasting Lunch Dinner Bedtime Overall  Glucose range:  110-153      Mean/median:     ?   POST-MEAL PC Breakfast PC Lunch PC Dinner  Glucose range:   156-197  Mean/median:         :Self-care: The diet that the patient has been following is: tries to limit portions.      Typical meal intake: Breakfast is oatmeal/yogurt, lunch is a grilled chicken, evening meal is meat and 2 vegetables, will have snacks on yogurt    Dinner at 6 pm           Dietician visit, most recent:2014                Weight history:   Wt Readings from Last 3 Encounters:  03/16/20 176 lb (79.8 kg)  12/02/19 187 lb 3.2 oz (84.9 kg)  07/29/19 180 lb (  81.6 kg)    Glycemic control:   Lab Results  Component Value Date   HGBA1C 6.6 (H) 03/13/2020   HGBA1C 6.7 (H) 11/29/2019   HGBA1C 6.5 (H) 07/07/2019   Lab Results  Component Value Date   MICROALBUR <0.7 11/29/2019   LDLCALC 82 11/29/2019   CREATININE 0.73 03/13/2020   Lab Results  Component Value Date   MICRALBCREAT 0.9 11/29/2019    Lab Results  Component Value Date   FRUCTOSAMINE 293 (H) 03/22/2018   FRUCTOSAMINE 250 08/12/2017   FRUCTOSAMINE 290 (H) 06/16/2017   FRUCTOSAMINE 284 10/22/2016     Lab on 03/13/2020  Component Date Value Ref Range Status  . Magnesium 03/13/2020 2.1  1.5 - 2.5 mg/dL Final  . Hgb A1c MFr Bld 03/13/2020 6.6* 4.6 - 6.5 % Final   Glycemic Control Guidelines for People with Diabetes:Non Diabetic:  <6%Goal of Therapy: <7%Additional Action Suggested:  >8%   . Sodium 03/13/2020  139  135 - 145 mEq/L Final  . Potassium 03/13/2020 4.1  3.5 - 5.1 mEq/L Final  . Chloride 03/13/2020 102  96 - 112 mEq/L Final  . CO2 03/13/2020 28  19 - 32 mEq/L Final  . Glucose, Bld 03/13/2020 100* 70 - 99 mg/dL Final  . BUN 03/13/2020 14  6 - 23 mg/dL Final  . Creatinine, Ser 03/13/2020 0.73  0.40 - 1.20 mg/dL Final  . GFR 03/13/2020 93.16  >60.00 mL/min Final   Calculated using the CKD-EPI Creatinine Equation (2021)  . Calcium 03/13/2020 9.4  8.4 - 10.5 mg/dL Final      Allergies as of 03/16/2020      Reactions   Liraglutide Other (See Comments)   Other reaction(s): Pancreatitis *Victoza       Medication List       Accurate as of March 16, 2020 12:58 PM. If you have any questions, ask your nurse or doctor.        Accu-Chek FastClix Lancets Misc 1 each by Does not apply route 2 (two) times a day. Use Accu Chek Fastclix lancets to check blood sugar twice daily.   Accu-Chek Guide test strip Generic drug: glucose blood USE ACCU CHEK GUIDE TEST STRIPS AS INSTRUCTED TO CHECK BLOOD SUGAR TWICE DAILY.   Accu-Chek Guide w/Device Kit 1 each by Does not apply route 2 (two) times a day. Use as instructed to check blood sugar twice daily.   albuterol 108 (90 Base) MCG/ACT inhaler Commonly known as: VENTOLIN HFA Inhale 1-2 puffs into the lungs every 4 (four) hours as needed for wheezing or shortness of breath.   amLODipine 5 MG tablet Commonly known as: NORVASC Take 5 mg by mouth daily.   aspirin EC 81 MG tablet Take 81 mg by mouth daily. Swallow whole.   atorvastatin 10 MG tablet Commonly known as: LIPITOR TAKE 1 TABLET (10 MG TOTAL) BY MOUTH DAILY AT 6 PM.   dapagliflozin propanediol 10 MG Tabs tablet Commonly known as: Farxiga Take 1 tablet (10 mg total) by mouth daily before breakfast.   fluticasone 50 MCG/ACT nasal spray Commonly known as: FLONASE Place 1 spray into both nostrils daily as needed for allergies or rhinitis.   gabapentin 300 MG capsule Commonly  known as: NEURONTIN Take 300 mg by mouth at bedtime.   meloxicam 15 MG tablet Commonly known as: MOBIC meloxicam 15 mg tablet  TAKE 1 TABLET EVERY DAY BY ORAL ROUTE FOR 30 DAYS.   metFORMIN 500 MG 24 hr tablet Commonly known as: GLUCOPHAGE-XR Take 2 tablets (  1,000 mg total) by mouth daily.   NovoLOG Mix 70/30 FlexPen (70-30) 100 UNIT/ML FlexPen Generic drug: insulin aspart protamine - aspart Inject 0.16-0.18 mLs (16-18 Units total) into the skin See admin instructions. Take 18 units in the morning and 16 units in evening   ondansetron 4 MG tablet Commonly known as: Zofran Take 1 tablet (4 mg total) by mouth every 8 (eight) hours as needed for nausea or vomiting.   Ozempic (1 MG/DOSE) 4 MG/3ML Sopn Generic drug: Semaglutide (1 MG/DOSE) INJECT 0.75 MLS (1 MG TOTAL) INTO THE SKIN ONCE A WEEK.   Pen Needles 30G X 5 MM Misc 1 each by Does not apply route 2 (two) times daily. Use to inject insulin twice daily.   ramipril 10 MG capsule Commonly known as: ALTACE Take 10 mg by mouth daily.   topiramate 50 MG tablet Commonly known as: TOPAMAX Take 25 mg by mouth at bedtime.       Allergies:  Allergies  Allergen Reactions  . Liraglutide Other (See Comments)    Other reaction(s): Pancreatitis  *Victoza     Past Medical History:  Diagnosis Date  . Anemia   . Asthma   . Diabetes mellitus   . Family history of adverse reaction to anesthesia    Brother  . Heart murmur   . Hyperlipidemia   . Hypertension   . Pancreatitis   . Pneumonia     Past Surgical History:  Procedure Laterality Date  . ABDOMINAL HYSTERECTOMY    . ANTERIOR LAT LUMBAR FUSION N/A 07/07/2019   Procedure: ANTERIOR LATERAL LUMBAR FUSION (XLIF) LUMBAR TWO THROUGH LUMBAR THREE, POSTERIOR SPINAL FUSION INTERBODY LUMBAR TWO THROUGH LUMBAR THREE;  Surgeon: Melina Schools, MD;  Location: [place];  Service: Orthopedics;  Laterality: N/A;  4 HRS  . CESAREAN SECTION    . CHOLECYSTECTOMY    . GANGLION CYST  EXCISION    . HIP CAPSULECTOMY Right 10/14/2018   Procedure: RIGHT HIP HETEROTOPIC OSSIFICATION RESECTION;  Surgeon: Shona Needles, MD;  Location: [place];  Service: Orthopedics;  Laterality: Right;  . LAPAROSCOPY      Family History  Problem Relation Age of Onset  . Diabetes Mother   . Hypertension Mother   . Diabetes Maternal Aunt   . Diabetes Paternal Aunt   . Diabetes Maternal Grandmother   . Heart disease Neg Hx     Social History:  reports that she quit smoking about 7 years ago. Her smoking use included cigarettes. She smoked 0.50 packs per day. She has never used smokeless tobacco. She reports current alcohol use. She reports that she does not use drugs.    Review of Systems    Lipid history: on Lipitor 10 mg She is taking it regularly with following results    Lab Results  Component Value Date   CHOL 153 11/29/2019   HDL 54.30 11/29/2019   LDLCALC 82 11/29/2019   LDLDIRECT 75.0 03/22/2018   TRIG 81.0 11/29/2019   CHOLHDL 3 11/29/2019           Hypertension:  followed by PCP, currently on 32m amlodipine and 10 mg ramipril  She has not taken her medications this morning Hypertension is followed by PCP, blood pressure was 140/80 about a month ago   BP Readings from Last 3 Encounters:  03/16/20 (!) 152/84  12/02/19 118/78  07/29/19 124/78   Hypokalemia:   Lab Results  Component Value Date   K 4.1 03/13/2020     Most recent foot exam: March  2021   Review of Systems    Physical Examination:  BP (!) 152/84   Pulse 95   Ht _0  (1.575 m)   Wt 176 lb (79.8 kg)   LMP 07/09/2011   SpO2 99%   BMI 32.19 kg/m    ASSESSMENT:  Diabetes type 2, insulin-dependent  See history of present illness for detailed discussion of current diabetes management, blood sugar patterns and problems identified  She is on Ozempic, premixed insulin twice a day, Farxiga and Metformin  Her A1c is about the same at 6.6  She has done better with weight loss using  Ozempic 1 mg and Farxiga 10 mg Also likely is more active with relief of her back pain although not able to exercise because of hip pain Unable to review her blood sugar readings but her morning sugar appears to be lower than usual No side effects from diabetes medications    HYPERLIPIDEMIA: Has been well-controlled on Lipitor  HYPERTENSION: Blood pressure is relatively higher but not consistent, managed by PCP   PLAN:    Reduce evening insulin to 14 units She will call if she has any continued tendency to low blood sugars  Follow-up in 4 months   Patient Instructions  Take 14 U at supper     Elayne Snare 03/16/2020, 12:58 PM   Note: This office note was prepared with Dragon voice recognition system technology. Any transcriptional errors that result from this process are unintentional.

## 2020-03-16 ENCOUNTER — Encounter: Payer: Self-pay | Admitting: Endocrinology

## 2020-03-16 ENCOUNTER — Ambulatory Visit (INDEPENDENT_AMBULATORY_CARE_PROVIDER_SITE_OTHER): Payer: BC Managed Care – PPO | Admitting: Endocrinology

## 2020-03-16 ENCOUNTER — Other Ambulatory Visit: Payer: Self-pay

## 2020-03-16 VITALS — BP 152/84 | HR 95 | Ht 62.0 in | Wt 176.0 lb

## 2020-03-16 DIAGNOSIS — Z794 Long term (current) use of insulin: Secondary | ICD-10-CM | POA: Diagnosis not present

## 2020-03-16 DIAGNOSIS — E1165 Type 2 diabetes mellitus with hyperglycemia: Secondary | ICD-10-CM | POA: Diagnosis not present

## 2020-03-16 NOTE — Patient Instructions (Signed)
Take 14 U at supper

## 2020-05-03 ENCOUNTER — Other Ambulatory Visit: Payer: Self-pay | Admitting: Endocrinology

## 2020-05-22 DIAGNOSIS — Z981 Arthrodesis status: Secondary | ICD-10-CM | POA: Diagnosis not present

## 2020-05-24 ENCOUNTER — Other Ambulatory Visit: Payer: Self-pay | Admitting: Endocrinology

## 2020-05-24 DIAGNOSIS — Z794 Long term (current) use of insulin: Secondary | ICD-10-CM

## 2020-05-24 DIAGNOSIS — E1165 Type 2 diabetes mellitus with hyperglycemia: Secondary | ICD-10-CM

## 2020-06-25 ENCOUNTER — Other Ambulatory Visit: Payer: Self-pay | Admitting: Endocrinology

## 2020-07-16 ENCOUNTER — Emergency Department (HOSPITAL_BASED_OUTPATIENT_CLINIC_OR_DEPARTMENT_OTHER)
Admission: EM | Admit: 2020-07-16 | Discharge: 2020-07-16 | Disposition: A | Discharge status: Home / Self Care | Payer: BC Managed Care – PPO | Attending: Emergency Medicine | Admitting: Emergency Medicine

## 2020-07-16 ENCOUNTER — Other Ambulatory Visit: Payer: Self-pay

## 2020-07-16 ENCOUNTER — Encounter (HOSPITAL_BASED_OUTPATIENT_CLINIC_OR_DEPARTMENT_OTHER): Payer: Self-pay | Admitting: *Deleted

## 2020-07-16 DIAGNOSIS — Z7982 Long term (current) use of aspirin: Secondary | ICD-10-CM | POA: Insufficient documentation

## 2020-07-16 DIAGNOSIS — Z79899 Other long term (current) drug therapy: Secondary | ICD-10-CM | POA: Insufficient documentation

## 2020-07-16 DIAGNOSIS — Z7984 Long term (current) use of oral hypoglycemic drugs: Secondary | ICD-10-CM | POA: Insufficient documentation

## 2020-07-16 DIAGNOSIS — H43392 Other vitreous opacities, left eye: Secondary | ICD-10-CM | POA: Diagnosis not present

## 2020-07-16 DIAGNOSIS — I10 Essential (primary) hypertension: Secondary | ICD-10-CM | POA: Diagnosis not present

## 2020-07-16 DIAGNOSIS — J45909 Unspecified asthma, uncomplicated: Secondary | ICD-10-CM | POA: Diagnosis not present

## 2020-07-16 DIAGNOSIS — Z87891 Personal history of nicotine dependence: Secondary | ICD-10-CM | POA: Diagnosis not present

## 2020-07-16 DIAGNOSIS — E1110 Type 2 diabetes mellitus with ketoacidosis without coma: Secondary | ICD-10-CM | POA: Diagnosis not present

## 2020-07-16 DIAGNOSIS — Z794 Long term (current) use of insulin: Secondary | ICD-10-CM | POA: Insufficient documentation

## 2020-07-16 NOTE — ED Provider Notes (Signed)
[facility]ENCY DEPARTMENT Provider Note   CSN: [id number] Arrival date & time: 07/16/20  1034     History Chief Complaint  Patient presents with   Eye Problem    Floater Left eye    Veronica Fletcher is a 55 y.o. female.  HPI      55yo female with history of DM, htn, hlpd, presents with concern for floater in her left eye.   Left eye with a floater since Saturday, like a black dot with lines coming out of it that moves around, moving around in eye since Saturday, only in left eye.  One single one. Not like a curtain, when lid closed feels like a flash that comes and goes, not a lot of flashing,  not flock of floaters.  Vision otherwise appears normal No eye pain, no numbness/weakness/facial droop/difficulty walking/talking No eye discharge [facility]  Contacts and glasses   Past Medical History:  Diagnosis Date   Anemia    Asthma    Diabetes mellitus    Family history of adverse reaction to anesthesia    Brother   Heart murmur    Hyperlipidemia    Hypertension    Pancreatitis    Pneumonia     Patient Active Problem List   Diagnosis Date Noted   Spinal stenosis in cervical region 07/07/2019   S/P lumbar fusion 07/07/2019   Heterotopic ossification 10/14/2018   Heterotopic ossification of bone 09/27/2018   HTN (hypertension) 02/09/2013   Type II or unspecified type diabetes mellitus with unspecified complication, uncontrolled 02/09/2013   DKA (diabetic ketoacidoses) 02/06/2013   Acute pancreatitis 02/06/2013   Menorrhagia 08/13/2011   Fibroids, submucosal 08/13/2011   Anemia 08/13/2011    Past Surgical History:  Procedure Laterality Date   ABDOMINAL HYSTERECTOMY     ANTERIOR LAT LUMBAR FUSION N/A 07/07/2019   Procedure: ANTERIOR LATERAL LUMBAR FUSION (XLIF) LUMBAR TWO THROUGH LUMBAR THREE, POSTERIOR SPINAL FUSION INTERBODY LUMBAR TWO THROUGH LUMBAR THREE;  Surgeon: Melina Schools, MD;  Location: [place];  Service: Orthopedics;   Laterality: N/A;  4 HRS   CESAREAN SECTION     CHOLECYSTECTOMY     GANGLION CYST EXCISION     HIP CAPSULECTOMY Right 10/14/2018   Procedure: RIGHT HIP HETEROTOPIC OSSIFICATION RESECTION;  Surgeon: Shona Needles, MD;  Location: [place];  Service: Orthopedics;  Laterality: Right;   LAPAROSCOPY     LUMBAR FUSION       OB History   No obstetric history on file.     Family History  Problem Relation Age of Onset   Diabetes Mother    Hypertension Mother    Diabetes Maternal Aunt    Diabetes Paternal Aunt    Diabetes Maternal Grandmother    Heart disease Neg Hx     Social History   Tobacco Use   Smoking status: Former    Pack years: 0.00    Types: Cigarettes    Quit date: 01/23/2013    Years since quitting: 7.4   Smokeless tobacco: Never  Vaping Use   Vaping Use: Never used  Substance Use Topics   Alcohol use: Yes    Comment: rarely   Drug use: No    Home Medications Prior to Admission medications   Medication Sig Start Date End Date Taking? Authorizing Provider  Accu-Chek FastClix Lancets MISC 1 each by Does not apply route 2 (two) times a day. Use Accu Chek Fastclix lancets to check blood sugar twice daily. 07/01/18  Elayne Snare, MD  ACCU-CHEK GUIDE test strip USE ACCU CHEK GUIDE TEST STRIPS AS INSTRUCTED TO CHECK BLOOD SUGAR TWICE DAILY. 09/20/19   Elayne Snare, MD  albuterol (VENTOLIN HFA) 108 (90 Base) MCG/ACT inhaler Inhale 1-2 puffs into the lungs every 4 (four) hours as needed for wheezing or shortness of breath.    [provider]  amLODipine (NORVASC) 5 MG tablet Take 5 mg by mouth daily. 12/02/15   [provider]  aspirin EC 81 MG tablet Take 81 mg by mouth daily. Swallow whole.    [provider]  atorvastatin (LIPITOR) 10 MG tablet TAKE 1 TABLET (10 MG TOTAL) BY MOUTH DAILY AT 6 PM. 07/01/19   Elayne Snare, MD  Blood Glucose Monitoring Suppl (ACCU-CHEK GUIDE) w/Device KIT 1 each by Does not apply route 2 (two) times a day. Use as  instructed to check blood sugar twice daily. 07/01/18   Elayne Snare, MD  FARXIGA 10 MG TABS tablet TAKE 1 TABLET BY MOUTH DAILY BEFORE BREAKFAST. 06/25/20   Elayne Snare, MD  fluticasone (FLONASE) 50 MCG/ACT nasal spray Place 1 spray into both nostrils daily as needed for allergies or rhinitis.    [provider]  gabapentin (NEURONTIN) 300 MG capsule Take 300 mg by mouth at bedtime.    [provider]  insulin lispro protamine-lispro (HUMALOG MIX 75/25) (75-25) 100 UNIT/ML SUSP injection Inject 18 units in A.M and 16 units in P.M  (To replace novolog 70/30 per insurance request) 05/03/20   Elayne Snare, MD  Insulin Pen Needle (PEN NEEDLES) 30G X 5 MM MISC 1 each by Does not apply route 2 (two) times daily. Use to inject insulin twice daily. 11/23/18   Elayne Snare, MD  meloxicam (MOBIC) 15 MG tablet meloxicam 15 mg tablet  TAKE 1 TABLET EVERY DAY BY ORAL ROUTE FOR 30 DAYS.    [provider]  metFORMIN (GLUCOPHAGE-XR) 500 MG 24 hr tablet TAKE 2 TABLETS (1,000 MG TOTAL) BY MOUTH DAILY. 05/25/20   Elayne Snare, MD  ondansetron (ZOFRAN) 4 MG tablet Take 1 tablet (4 mg total) by mouth every 8 (eight) hours as needed for nausea or vomiting. 07/07/19   Melina Schools, MD  OZEMPIC, 1 MG/DOSE, 4 MG/3ML SOPN INJECT 0.75 MLS (1 MG TOTAL) INTO THE SKIN ONCE A WEEK. 12/02/19   Elayne Snare, MD  ramipril (ALTACE) 10 MG capsule Take 10 mg by mouth daily.    [provider]  topiramate (TOPAMAX) 50 MG tablet Take 25 mg by mouth at bedtime.     [provider]    Allergies    Liraglutide  Review of Systems   Review of Systems  Constitutional:  Negative for fatigue.  Eyes:  Positive for visual disturbance. Negative for pain, discharge and redness.  Respiratory:  Negative for cough and shortness of breath.   Cardiovascular:  Negative for chest pain.  Gastrointestinal:  Negative for abdominal pain, nausea and vomiting.  Musculoskeletal:  Negative for back pain.  Skin:   Negative for rash.  Neurological:  Negative for dizziness, syncope, facial asymmetry, speech difficulty, weakness, numbness and headaches (Did on Friday, Saturday).   Physical Exam Updated Vital Signs BP (!) 152/81 (BP Location: Right Arm)   Pulse 84   Temp 98.4 F (36.9 C) (Oral)   Resp 16   Ht 5' 6"  (1.676 m)   Wt 83 kg   LMP 07/09/2011   SpO2 100%   BMI 29.54 kg/m   Physical Exam Constitutional:  General: She is not in acute distress.    Appearance: Normal appearance. She is not ill-appearing.  HENT:     Head: Normocephalic and atraumatic.  Eyes:     General: No visual field deficit.    Extraocular Movements: Extraocular movements intact.     Conjunctiva/sclera: Conjunctivae normal.     Pupils: Pupils are equal, round, and reactive to light.  Cardiovascular:     Rate and Rhythm: Normal rate and regular rhythm.     Pulses: Normal pulses.  Pulmonary:     Effort: Pulmonary effort is normal. No respiratory distress.  Musculoskeletal:        General: No swelling or tenderness.     Cervical back: Normal range of motion.  Skin:    General: Skin is warm and dry.     Findings: No erythema or rash.  Neurological:     General: No focal deficit present.     Mental Status: She is alert and oriented to person, place, and time.     GCS: GCS eye subscore is 4. GCS verbal subscore is 5. GCS motor subscore is 6.     Cranial Nerves: No cranial nerve deficit, dysarthria or facial asymmetry.     Sensory: No sensory deficit.     Motor: No weakness or tremor.     Coordination: Coordination normal. Finger-Nose-Finger Test normal.     Gait: Gait normal.    ED Results / Procedures / Treatments   Labs (all labs ordered are listed, but only abnormal results are displayed) Labs Reviewed - No data to display  EKG None  Radiology No results found.  Procedures Procedures   Medications Ordered in ED Medications - No data to display  ED Course  I have reviewed the triage  vital signs and the nursing notes.  Pertinent labs & imaging results that were available during my care of the patient were reviewed by me and considered in my medical decision making (see chart for details).    MDM Rules/Calculators/A&P                           55yo female with history of DM, htn, hlpd, presents with concern for floater in her left eye.  History and physical exam are not consistent with CVA, acute retinal detachment, acute angle closure glaucoma, corneal abrasion, iritis, CRAO or CRVO.  Her vision is 20/20 with correction.  Discussed with ophthalmologist on-call, Dr. Coralyn Pear, who feels she is appropriate for close outpatient follow-up with her eye doctor, or may call their office if she has other concerns.  Suspects possible vitreus hemorrhage or vitreus attachment.  Recommend follow up with her OD or with Dr. Coralyn Pear if not able to be seen.     Final Clinical Impression(s) / ED Diagnoses Final diagnoses:  Vitreous floaters of left eye    Rx / DC Orders ED Discharge Orders     None        Gareth Colligan, MD 07/16/20 1621

## 2020-07-16 NOTE — ED Triage Notes (Signed)
Left eye floaters that started yesterday. No pain NAD

## 2020-07-16 NOTE — ED Triage Notes (Signed)
Pt reports "floater" in her left eye, right eye is fine per pt. Pt seen at urgent care just pta who sent her here for further eval. Pt denies ha, or any other c/o. Playing candy crush on her phone in nad during triage, moe + x 4 ext, speech clear.

## 2020-07-17 ENCOUNTER — Other Ambulatory Visit: Payer: Self-pay

## 2020-07-17 ENCOUNTER — Ambulatory Visit (INDEPENDENT_AMBULATORY_CARE_PROVIDER_SITE_OTHER): Payer: BC Managed Care – PPO | Admitting: Ophthalmology

## 2020-07-17 ENCOUNTER — Encounter (INDEPENDENT_AMBULATORY_CARE_PROVIDER_SITE_OTHER): Payer: Self-pay | Admitting: Ophthalmology

## 2020-07-17 DIAGNOSIS — H40053 Ocular hypertension, bilateral: Secondary | ICD-10-CM

## 2020-07-17 DIAGNOSIS — E119 Type 2 diabetes mellitus without complications: Secondary | ICD-10-CM

## 2020-07-17 DIAGNOSIS — H43812 Vitreous degeneration, left eye: Secondary | ICD-10-CM | POA: Diagnosis not present

## 2020-07-17 DIAGNOSIS — H25813 Combined forms of age-related cataract, bilateral: Secondary | ICD-10-CM | POA: Diagnosis not present

## 2020-07-17 DIAGNOSIS — H3581 Retinal edema: Secondary | ICD-10-CM

## 2020-07-17 NOTE — Progress Notes (Signed)
[facility] Note  07/17/2020     CHIEF COMPLAINT Patient presents for Flashes/floaters   HISTORY OF PRESENT ILLNESS: Veronica Fletcher is a 55 y.o. female who presents to the clinic today for:   HPI     Flashes/floaters   In left eye.  This started 3 days ago.  Duration of 3 days.  Duration Constant.  Characterized as large.  Since onset it is stable.  Associated Symptoms Floaters.  Negative for Flashes and Distortion.  Context:  distance vision, mid-range vision and near vision.  Context: diabetes.  Treatments tried include artificial tears.  Response to treatment was no improvement.  I, the attending physician,  performed the HPI with the patient and updated documentation appropriately.        Comments   55 y/o female pt here for eval of floater OS x 3 days.  ED referral.  Floater sudden onset this past Saturday.  Nothing like this has ever occurred before.  Pt was busy helping with chores for a family reunion.  Floater is large, black and constant.  No change in [place] OU.  Denies pain, FOL.  Allergy gtts prn OU.  BS 135 this a.m.  A1C 6.6.      Last edited by Bernarda Caffey, MD on 07/17/2020 12:35 PM.    Pt is here on ED referral for new floaters OS, pt states she saw seen at the ED in [facility] yesterday, she states she was at a family reunion Saturday and thought she had something on her CL, but it was moving around and was there even when she took the CL out, pt states the floater is still there and constant since onset  Referring physician: Gareth Bezold, MD [place],  [place] [address]  HISTORICAL INFORMATION:   Selected notes from the [facility] ED referral for eval of floater OS x 3 days   CURRENT MEDICATIONS: No current outpatient medications on file. (Ophthalmic Drugs)   No current facility-administered medications for this visit. (Ophthalmic Drugs)   Current Outpatient Medications (Other)  Medication Sig   Accu-Chek  FastClix Lancets MISC 1 each by Does not apply route 2 (two) times a day. Use Accu Chek Fastclix lancets to check blood sugar twice daily.   ACCU-CHEK GUIDE test strip USE ACCU CHEK GUIDE TEST STRIPS AS INSTRUCTED TO CHECK BLOOD SUGAR TWICE DAILY.   albuterol (VENTOLIN HFA) 108 (90 Base) MCG/ACT inhaler Inhale 1-2 puffs into the lungs every 4 (four) hours as needed for wheezing or shortness of breath.   amLODipine (NORVASC) 5 MG tablet Take 5 mg by mouth daily.   aspirin EC 81 MG tablet Take 81 mg by mouth daily. Swallow whole.   atorvastatin (LIPITOR) 10 MG tablet TAKE 1 TABLET (10 MG TOTAL) BY MOUTH DAILY AT 6 PM.   Blood Glucose Monitoring Suppl (ACCU-CHEK GUIDE) w/Device KIT 1 each by Does not apply route 2 (two) times a day. Use as instructed to check blood sugar twice daily.   celecoxib (CELEBREX) 200 MG capsule celecoxib 200 mg capsule  TAKE 1 CAPSULE EVERY DAY BY ORAL ROUTE IN THE MORNING FOR 30 DAYS.   enoxaparin (LOVENOX) 40 MG/0.4ML injection Lovenox 40 mg/0.4 mL subcutaneous syringe  Inject 0.4 mL every day by subcutaneous route for 10 days.  When finished with 10 day course of lovenox transition to Daily aspirin   FARXIGA 10 MG TABS tablet TAKE 1 TABLET BY MOUTH DAILY BEFORE BREAKFAST.  fluticasone (FLONASE) 50 MCG/ACT nasal spray Place 1 spray into both nostrils daily as needed for allergies or rhinitis.   fluticasone (FLOVENT HFA) 110 MCG/ACT inhaler Flovent HFA 110 mcg/actuation aerosol inhaler   gabapentin (NEURONTIN) 300 MG capsule Take 300 mg by mouth at bedtime.   gabapentin (NEURONTIN) 300 MG capsule gabapentin 300 mg capsule  Take 1 capsule every day by oral route at bedtime for 15 days.   insulin aspart protamine - aspart (NOVOLOG MIX 70/30 FLEXPEN) (70-30) 100 UNIT/ML FlexPen Novolog Mix 70-30 FlexPen U-100 Insulin 100 unit/mL subcutaneous pen  INJECT 18 UNITS IN THE MORNING AND 16 UNITS IN EVENING   insulin lispro protamine-lispro (HUMALOG MIX 75/25) (75-25) 100 UNIT/ML  SUSP injection Inject 18 units in A.M and 16 units in P.M  (To replace novolog 70/30 per insurance request)   Insulin Pen Needle (PEN NEEDLES) 30G X 5 MM MISC 1 each by Does not apply route 2 (two) times daily. Use to inject insulin twice daily.   meloxicam (MOBIC) 15 MG tablet meloxicam 15 mg tablet  TAKE 1 TABLET EVERY DAY BY ORAL ROUTE FOR 30 DAYS.   Menthol, Topical Analgesic, (BIOFREEZE ROLL-ON) 4 % GEL Biofreeze (menthol) 4 % topical gel  Apply 1 application 4 times a day by topical route as needed for 30 days.   metFORMIN (GLUCOPHAGE-XR) 500 MG 24 hr tablet TAKE 2 TABLETS (1,000 MG TOTAL) BY MOUTH DAILY.   methocarbamol (ROBAXIN) 500 MG tablet methocarbamol 500 mg tablet   ondansetron (ZOFRAN) 4 MG tablet Take 1 tablet (4 mg total) by mouth every 8 (eight) hours as needed for nausea or vomiting.   ondansetron (ZOFRAN) 8 MG tablet ondansetron HCl 8 mg tablet   oxyCODONE-acetaminophen (PERCOCET) 10-325 MG tablet oxycodone-acetaminophen 10 mg-325 mg tablet  TAKE 1 TABLET BY MOUTH THREE TIMES A DAY AS NEEDED   OZEMPIC, 1 MG/DOSE, 4 MG/3ML SOPN INJECT 0.75 MLS (1 MG TOTAL) INTO THE SKIN ONCE A WEEK.   ramipril (ALTACE) 10 MG capsule Take 10 mg by mouth daily.   topiramate (TOPAMAX) 25 MG tablet topiramate 25 mg tablet  TAKE 1 TABLET BY MOUTH EVERY DAY AT NIGHT   topiramate (TOPAMAX) 50 MG tablet Take 25 mg by mouth at bedtime.    traMADol (ULTRAM) 50 MG tablet tramadol 50 mg tablet  TAKE 1 TABLET TWICE A DAY BY ORAL ROUTE AS NEEDED FOR 20 DAYS.   No current facility-administered medications for this visit. (Other)      REVIEW OF SYSTEMS: ROS   Positive for: Musculoskeletal, Endocrine, Cardiovascular, Eyes, Respiratory Negative for: Constitutional, Gastrointestinal, Neurological, Skin, Genitourinary, HENT, Psychiatric, Allergic/Imm, Heme/Lymph Last edited by Matthew Folks, COA on 07/17/2020  9:38 AM.       ALLERGIES Allergies  Allergen Reactions   Liraglutide Other (See  Comments)    Other reaction(s): Pancreatitis  *Victoza     PAST MEDICAL HISTORY Past Medical History:  Diagnosis Date   Anemia    Asthma    Diabetes mellitus    Family history of adverse reaction to anesthesia    Brother   Heart murmur    Hyperlipidemia    Hypertension    Pancreatitis    Pneumonia    Past Surgical History:  Procedure Laterality Date   ABDOMINAL HYSTERECTOMY     ANTERIOR LAT LUMBAR FUSION N/A 07/07/2019   Procedure: ANTERIOR LATERAL LUMBAR FUSION (XLIF) LUMBAR TWO THROUGH LUMBAR THREE, POSTERIOR SPINAL FUSION INTERBODY LUMBAR TWO THROUGH LUMBAR THREE;  Surgeon: Melina Schools, MD;  Location: [facility]  OR;  Service: Orthopedics;  Laterality: N/A;  4 HRS   CESAREAN SECTION     CHOLECYSTECTOMY     GANGLION CYST EXCISION     HIP CAPSULECTOMY Right 10/14/2018   Procedure: RIGHT HIP HETEROTOPIC OSSIFICATION RESECTION;  Surgeon: Shona Needles, MD;  Location: [facility];  Service: Orthopedics;  Laterality: Right;   LAPAROSCOPY     LUMBAR FUSION      FAMILY HISTORY Family History  Problem Relation Age of Onset   Diabetes Mother    Hypertension Mother    Glaucoma Brother    Diabetes Maternal Aunt    Diabetes Paternal Aunt    Diabetes Maternal Grandmother    Heart disease Neg Hx     SOCIAL HISTORY Social History   Tobacco Use   Smoking status: Former    Pack years: 0.00    Types: Cigarettes    Quit date: 01/23/2013    Years since quitting: 7.4   Smokeless tobacco: Never  Vaping Use   Vaping Use: Never used  Substance Use Topics   Alcohol use: Yes    Comment: rarely   Drug use: No         OPHTHALMIC EXAM:  Base Eye Exam     Visual Acuity (Snellen - Linear)       Right Left   Dist cc 20/20 20/20    Correction: Glasses         Tonometry (Tonopen, 9:47 AM)       Right Left   Pressure 20 22  Squeezing        Pupils       Dark Light Shape React APD   Right 4 3 Round Brisk None   Left 4 3 Round Brisk None         Visual Fields  (Counting fingers)       Left Right    Full Full         Extraocular Movement       Right Left    Full, Ortho Full, Ortho         Neuro/Psych     Oriented x3: Yes   Mood/Affect: Normal         Dilation     Both eyes: 1.0% Mydriacyl, 2.5% Phenylephrine @ 9:47 AM           Slit Lamp and Fundus Exam     Slit Lamp Exam       Right Left   Lids/Lashes Dermatochalasis - upper lid Dermatochalasis - upper lid   Conjunctiva/Sclera Melanosis Melanosis   Cornea trace EBMD trace EBMD, trace arcus, mild tear film debris   Anterior Chamber Deep and quiet Deep and quiet   Iris Round and dilated, No NVI Round and dilated, No NVI   Lens 2+ Nuclear sclerosis, 2+ Cortical cataract 2+ Nuclear sclerosis, 2+ Cortical cataract   Vitreous Vitreous syneresis Vitreous syneresis, Posterior vitreous detachment, Weiss ring         Fundus Exam       Right Left   Disc +Pallor, +cupping, temporal PPA, Thin inferior rim +Pallor, +cupping, temporal PPA/PPP, Thin inferior rim   C/D Ratio 0.8 0.6   Macula Flat, Blunted foveal reflex, mild RPE mottling, No heme or edema Flat, Blunted foveal reflex, mild RPE mottling, No heme or edema   Vessels attenuated, mild tortuousity attenuated, mild tortuousity   Periphery Attached, no heme, no RT/RD Attached, no heme, no RT/RD           Refraction  Wearing Rx       Sphere Cylinder Axis Add   Right -5.50 +0.25 091 +1.75   Left -5.75 +0.50 055 +1.75    Age: 47 mos   Type: PAL         Manifest Refraction       Sphere Cylinder Axis Dist VA   Right -5.50 +0.50 090 20/20   Left -6.00 +0.50 060 20/20            IMAGING AND PROCEDURES  Imaging and Procedures for 07/17/2020  OCT, Retina - OU - Both Eyes       Right Eye Quality was good. Central Foveal Thickness: 223. Progression has no prior data. Findings include normal foveal contour, no IRF, no SRF (Partial PVD).   Left Eye Quality was good. Central Foveal Thickness: 222.  Progression has no prior data. Findings include normal foveal contour, no IRF, no SRF (Focal ellipsoid disruption nasal mac, trace vitreous opacities).   Notes *Images captured and stored on drive  Diagnosis / Impression:  NFP, no IRF/SRF OD: partial PVD OS: Focal ellipsoid disruption nasal mac, trace vitreous opacities  Clinical management:  See below  Abbreviations: NFP - Normal foveal profile. CME - cystoid macular edema. PED - pigment epithelial detachment. IRF - intraretinal fluid. SRF - subretinal fluid. EZ - ellipsoid zone. ERM - epiretinal membrane. ORA - outer retinal atrophy. ORT - outer retinal tubulation. SRHM - subretinal hyper-reflective material. IRHM - intraretinal hyper-reflective material              ASSESSMENT/PLAN:    ICD-10-CM   1. Posterior vitreous detachment of left eye  H43.812     2. Diabetes mellitus type 2 without retinopathy ([place])  E11.9     3. Retinal edema  H35.81 OCT, Retina - OU - Both Eyes    4. Combined forms of age-related cataract of both eyes  H25.813     5. Bilateral ocular hypertension  H40.053       1. PVD / vitreous syneresis OS  - acute, symptomatic floater OS -- onset Saturday, 7.2.22  - no photopsias  - Discussed findings and prognosis  - No RT or RD on 360 peripheral exam  - Reviewed s/s of RT/RD  - Strict return precautions for any such RT/RD signs/symptoms  - f/u in 4 wks -- DFE/OCT  2,3. Diabetes mellitus, type 2 without retinopathy - The incidence, risk factors for progression, natural history and treatment options for diabetic retinopathy  were discussed with patient.   - The need for close monitoring of blood glucose, blood pressure, and serum lipids, avoiding cigarette or any type of tobacco, and the need for long term follow up was also discussed with patient. - f/u in 1 year, sooner prn  4. Mixed Cataract OU - The symptoms of cataract, surgical options, and treatments and risks were discussed with patient. -  discussed diagnosis and progression - monitor  5,6. Glaucoma suspect / Ocular Hypertension OU - IOP today: 20,22 - +cupping OU - +family hx of glaucoma - will refer to [facility] for glc eval and management   Ophthalmic Meds Ordered this visit:  No orders of the defined types were placed in this encounter.      Return in about 4 weeks (around 08/14/2020) for f/u PVD OS, DFE, OCT.  There are no Patient Instructions on file for this visit.   Explained the diagnoses, plan, and follow up with the patient and they expressed understanding.  Patient expressed understanding  of the importance of proper follow up care.   This document serves as a record of services personally performed by Gardiner Sleeper, MD, PhD. It was created on their behalf by Estill Bakes, COT an ophthalmic technician. The creation of this record is the provider's dictation and/or activities during the visit.    Electronically signed by: Estill Bakes, COT 7.5.22 @ 12:42 PM   Gardiner Sleeper, M.D., Ph.D. Diseases & Surgery of the Retina and [place] 7.5.22  I have reviewed the above documentation for accuracy and completeness, and I agree with the above. Gardiner Sleeper, M.D., Ph.D. 07/17/20 12:42 PM   Abbreviations: M myopia (nearsighted); A astigmatism; H hyperopia (farsighted); P presbyopia; Mrx spectacle prescription;  CTL contact lenses; OD right eye; OS left eye; OU both eyes  XT exotropia; ET esotropia; PEK punctate epithelial keratitis; PEE punctate epithelial erosions; DES dry eye syndrome; MGD meibomian gland dysfunction; ATs artificial tears; PFAT's preservative free artificial tears; [place] nuclear sclerotic cataract; PSC posterior subcapsular cataract; ERM epi-retinal membrane; PVD posterior vitreous detachment; RD retinal detachment; DM diabetes mellitus; DR diabetic retinopathy; NPDR non-proliferative diabetic retinopathy; PDR proliferative diabetic retinopathy; CSME  clinically significant macular edema; DME diabetic macular edema; dbh dot blot hemorrhages; CWS cotton wool spot; POAG primary open angle glaucoma; C/D cup-to-disc ratio; HVF humphrey visual field; GVF goldmann visual field; OCT optical coherence tomography; IOP intraocular pressure; BRVO Branch retinal vein occlusion; CRVO central retinal vein occlusion; CRAO central retinal artery occlusion; BRAO branch retinal artery occlusion; RT retinal tear; SB scleral buckle; PPV pars plana vitrectomy; VH Vitreous hemorrhage; PRP panretinal laser photocoagulation; IVK intravitreal kenalog; VMT vitreomacular traction; MH Macular hole;  NVD neovascularization of the disc; NVE neovascularization elsewhere; AREDS age related eye disease study; ARMD age related macular degeneration; POAG primary open angle glaucoma; EBMD epithelial/anterior basement membrane dystrophy; ACIOL anterior chamber intraocular lens; IOL intraocular lens; PCIOL posterior chamber intraocular lens; Phaco/IOL phacoemulsification with intraocular lens placement; [place] photorefractive keratectomy; LASIK laser assisted in situ keratomileusis; HTN hypertension; DM diabetes mellitus; COPD chronic obstructive pulmonary disease

## 2020-07-18 ENCOUNTER — Other Ambulatory Visit (INDEPENDENT_AMBULATORY_CARE_PROVIDER_SITE_OTHER): Payer: BC Managed Care – PPO

## 2020-07-18 DIAGNOSIS — E1165 Type 2 diabetes mellitus with hyperglycemia: Secondary | ICD-10-CM

## 2020-07-18 DIAGNOSIS — Z794 Long term (current) use of insulin: Secondary | ICD-10-CM

## 2020-07-18 LAB — BASIC METABOLIC PANEL
BUN: 18 mg/dL (ref 6–23)
CO2: 26 mEq/L (ref 19–32)
Calcium: 9 mg/dL (ref 8.4–10.5)
Chloride: 106 mEq/L (ref 96–112)
Creatinine, Ser: 0.83 mg/dL (ref 0.40–1.20)
GFR: 79.66 mL/min (ref >60.00)
Glucose, Bld: 84 mg/dL (ref 70–99)
Potassium: 4.1 mEq/L (ref 3.5–5.1)
Sodium: 139 mEq/L (ref 135–145)

## 2020-07-18 LAB — HEMOGLOBIN A1C: Hgb A1c MFr Bld: 6.3 % (ref 4.6–6.5)

## 2020-07-19 ENCOUNTER — Ambulatory Visit (INDEPENDENT_AMBULATORY_CARE_PROVIDER_SITE_OTHER): Payer: BC Managed Care – PPO | Admitting: Endocrinology

## 2020-07-19 VITALS — BP 125/72 | HR 91 | Ht 62.0 in | Wt 181.5 lb

## 2020-07-19 DIAGNOSIS — Z794 Long term (current) use of insulin: Secondary | ICD-10-CM

## 2020-07-19 DIAGNOSIS — E1165 Type 2 diabetes mellitus with hyperglycemia: Secondary | ICD-10-CM

## 2020-07-19 DIAGNOSIS — E7800 Pure hypercholesterolemia, unspecified: Secondary | ICD-10-CM

## 2020-07-19 DIAGNOSIS — I10 Essential (primary) hypertension: Secondary | ICD-10-CM

## 2020-07-19 NOTE — Progress Notes (Signed)
Patient ID: Veronica Fletcher, female   DOB: 1965-10-02, 55 y.o.   MRN: [id number]           Reason for Appointment: Follow-up for Type 2 Diabetes  Referring physician: Harrington Challenger  History of Present Illness:          Date of diagnosis of type 2 diabetes mellitus: 2005       Background history:  She had been started on metformin initially and not clear what other treatment she had taken subsequently, records are being awaited today She apparently was doing very well with a combination of Victoza and metformin until 2015 and she thinks she was losing weight with this.  She had taken Victoza for several months at least However according to his lab records her best A1c in 2014 was only 7.9 Apparently she was taken off Victoza when she was admitted for abdominal pain presumed to be from pancreatitis in 01/2013 She was then started on premixed insulin and taken off metformin also on discharge. She was referred here for poorly controlled diabetes with A1c consistently around 10% On her initial consultation she was given metformin and Jardiance in addition to her insulin  V-go pump 20 unit basal and boluses 4 units previously stopped  Recent history:   INSULIN regimen is: Novolog Mix at meals: 18 a.m.-16 before dinner   Non-insulin hypoglycemic drugs: Farxiga 10 mg daily, metformin ER 1000 mg daily Ozempic 1.0 mg weekly  Her A1c is at the same as before at 6.6  Current management, blood sugar patterns and problems identified: She did not reduce her evening dose as indicated on the last visit However has not had any low blood sugars  Has been having overall fairly good blood sugars Benefiting from Ozempic 1 mg weekly with no side effects Wilder Glade has been continued and no change in renal function She is trying to be little more active However not clear why her weight has started going back up She thinks that she is having difficulty finding time to exercise because of long working hours However  she has had less stress recently Has checked blood sugar only once at night   Side effects from medications have been:?  Victoza caused pancreatitis  Compliance with the medical regimen: Good   Glucose monitoring:  done 0.5 times a day         Glucometer:  Accu-Chek guide   Blood Glucose readings as below   PRE-MEAL Fasting Lunch Dinner Bedtime Overall  Glucose range: 80-175    80-175  Mean/median: 136    133   POST-MEAL PC Breakfast PC Lunch PC Dinner  Glucose range:   167  Mean/median:        :Self-care: The diet that the patient has been following is: tries to limit portions.      Typical meal intake: Breakfast is oatmeal/yogurt, lunch is a grilled chicken, evening meal is meat and 2 vegetables, will have snacks on yogurt    Dinner at 6 pm           Dietician visit, most recent:2014                Weight history:   Wt Readings from Last 3 Encounters:  07/19/20 181 lb 8 oz (82.3 kg)  07/16/20 183 lb (83 kg)  03/16/20 176 lb (79.8 kg)    Glycemic control:   Lab Results  Component Value Date   HGBA1C 6.3 07/18/2020   HGBA1C 6.6 (H) 03/13/2020   HGBA1C 6.7 (H)  11/29/2019   Lab Results  Component Value Date   MICROALBUR <0.7 11/29/2019   LDLCALC 82 11/29/2019   CREATININE 0.83 07/18/2020   Lab Results  Component Value Date   MICRALBCREAT 0.9 11/29/2019    Lab Results  Component Value Date   FRUCTOSAMINE 293 (H) 03/22/2018   FRUCTOSAMINE 250 08/12/2017   FRUCTOSAMINE 290 (H) 06/16/2017   FRUCTOSAMINE 284 10/22/2016     Lab on 07/18/2020  Component Date Value Ref Range Status   Sodium 07/18/2020 139  135 - 145 mEq/L Final   Potassium 07/18/2020 4.1  3.5 - 5.1 mEq/L Final   Chloride 07/18/2020 106  96 - 112 mEq/L Final   CO2 07/18/2020 26  19 - 32 mEq/L Final   Glucose, Bld 07/18/2020 84  70 - 99 mg/dL Final   BUN 07/18/2020 18  6 - 23 mg/dL Final   Creatinine, Ser 07/18/2020 0.83  0.40 - 1.20 mg/dL Final   GFR 07/18/2020 79.66  >60.00 mL/min  Final   Calculated using the CKD-EPI Creatinine Equation (2021)   Calcium 07/18/2020 9.0  8.4 - 10.5 mg/dL Final   Hgb A1c MFr Bld 07/18/2020 6.3  4.6 - 6.5 % Final   Glycemic Control Guidelines for People with Diabetes:Non Diabetic:  <6%Goal of Therapy: <7%Additional Action Suggested:  >8%       Allergies as of 07/19/2020       Reactions   Liraglutide Other (See Comments)   Other reaction(s): Pancreatitis *Victoza         Medication List        Accurate as of July 19, 2020  2:06 PM. If you have any questions, ask your nurse or doctor.          Accu-Chek FastClix Lancets Misc 1 each by Does not apply route 2 (two) times a day. Use Accu Chek Fastclix lancets to check blood sugar twice daily.   Accu-Chek Guide test strip Generic drug: glucose blood USE ACCU CHEK GUIDE TEST STRIPS AS INSTRUCTED TO CHECK BLOOD SUGAR TWICE DAILY.   Accu-Chek Guide w/Device Kit 1 each by Does not apply route 2 (two) times a day. Use as instructed to check blood sugar twice daily.   albuterol 108 (90 Base) MCG/ACT inhaler Commonly known as: VENTOLIN HFA Inhale 1-2 puffs into the lungs every 4 (four) hours as needed for wheezing or shortness of breath.   amLODipine 5 MG tablet Commonly known as: NORVASC Take 5 mg by mouth daily.   aspirin EC 81 MG tablet Take 81 mg by mouth daily. Swallow whole.   atorvastatin 10 MG tablet Commonly known as: LIPITOR TAKE 1 TABLET (10 MG TOTAL) BY MOUTH DAILY AT 6 PM.   Biofreeze Roll-On 4 % Gel Generic drug: Menthol (Topical Analgesic) Biofreeze (menthol) 4 % topical gel  Apply 1 application 4 times a day by topical route as needed for 30 days.   celecoxib 200 MG capsule Commonly known as: CELEBREX celecoxib 200 mg capsule  TAKE 1 CAPSULE EVERY DAY BY ORAL ROUTE IN THE MORNING FOR 30 DAYS.   enoxaparin 40 MG/0.4ML injection Commonly known as: LOVENOX Lovenox 40 mg/0.4 mL subcutaneous syringe  Inject 0.4 mL every day by subcutaneous route for 10  days.  When finished with 10 day course of lovenox transition to Daily aspirin   Farxiga 10 MG Tabs tablet Generic drug: dapagliflozin propanediol TAKE 1 TABLET BY MOUTH DAILY BEFORE BREAKFAST.   fluticasone 110 MCG/ACT inhaler Commonly known as: FLOVENT HFA Flovent HFA 110 mcg/actuation aerosol  inhaler   fluticasone 50 MCG/ACT nasal spray Commonly known as: FLONASE Place 1 spray into both nostrils daily as needed for allergies or rhinitis.   gabapentin 300 MG capsule Commonly known as: NEURONTIN Take 300 mg by mouth at bedtime.   gabapentin 300 MG capsule Commonly known as: NEURONTIN gabapentin 300 mg capsule  Take 1 capsule every day by oral route at bedtime for 15 days.   HumaLOG Mix 75/25 (75-25) 100 UNIT/ML Susp injection Generic drug: insulin lispro protamine-lispro Inject 18 units in A.M and 16 units in P.M  (To replace novolog 70/30 per insurance request)   meloxicam 15 MG tablet Commonly known as: MOBIC meloxicam 15 mg tablet  TAKE 1 TABLET EVERY DAY BY ORAL ROUTE FOR 30 DAYS.   metFORMIN 500 MG 24 hr tablet Commonly known as: GLUCOPHAGE-XR TAKE 2 TABLETS (1,000 MG TOTAL) BY MOUTH DAILY.   methocarbamol 500 MG tablet Commonly known as: ROBAXIN methocarbamol 500 mg tablet   NovoLOG Mix 70/30 FlexPen (70-30) 100 UNIT/ML FlexPen Generic drug: insulin aspart protamine - aspart Novolog Mix 70-30 FlexPen U-100 Insulin 100 unit/mL subcutaneous pen  INJECT 18 UNITS IN THE MORNING AND 16 UNITS IN EVENING   ondansetron 8 MG tablet Commonly known as: ZOFRAN ondansetron HCl 8 mg tablet   ondansetron 4 MG tablet Commonly known as: Zofran Take 1 tablet (4 mg total) by mouth every 8 (eight) hours as needed for nausea or vomiting.   oxyCODONE-acetaminophen 10-325 MG tablet Commonly known as: PERCOCET oxycodone-acetaminophen 10 mg-325 mg tablet  TAKE 1 TABLET BY MOUTH THREE TIMES A DAY AS NEEDED   Ozempic (1 MG/DOSE) 4 MG/3ML Sopn Generic drug: Semaglutide (1  MG/DOSE) INJECT 0.75 MLS (1 MG TOTAL) INTO THE SKIN ONCE A WEEK.   Pen Needles 30G X 5 MM Misc 1 each by Does not apply route 2 (two) times daily. Use to inject insulin twice daily.   ramipril 10 MG capsule Commonly known as: ALTACE Take 10 mg by mouth daily.   topiramate 50 MG tablet Commonly known as: TOPAMAX Take 25 mg by mouth at bedtime.   topiramate 25 MG tablet Commonly known as: TOPAMAX topiramate 25 mg tablet  TAKE 1 TABLET BY MOUTH EVERY DAY AT NIGHT   traMADol 50 MG tablet Commonly known as: ULTRAM tramadol 50 mg tablet  TAKE 1 TABLET TWICE A DAY BY ORAL ROUTE AS NEEDED FOR 20 DAYS.        Allergies:  Allergies  Allergen Reactions   Liraglutide Other (See Comments)    Other reaction(s): Pancreatitis  *Victoza     Past Medical History:  Diagnosis Date   Anemia    Asthma    Diabetes mellitus    Family history of adverse reaction to anesthesia    Brother   Heart murmur    Hyperlipidemia    Hypertension    Pancreatitis    Pneumonia     Past Surgical History:  Procedure Laterality Date   ABDOMINAL HYSTERECTOMY     ANTERIOR LAT LUMBAR FUSION N/A 07/07/2019   Procedure: ANTERIOR LATERAL LUMBAR FUSION (XLIF) LUMBAR TWO THROUGH LUMBAR THREE, POSTERIOR SPINAL FUSION INTERBODY LUMBAR TWO THROUGH LUMBAR THREE;  Surgeon: Melina Schools, MD;  Location: [facility];  Service: Orthopedics;  Laterality: N/A;  4 HRS   CESAREAN SECTION     CHOLECYSTECTOMY     GANGLION CYST EXCISION     HIP CAPSULECTOMY Right 10/14/2018   Procedure: RIGHT HIP HETEROTOPIC OSSIFICATION RESECTION;  Surgeon: Shona Needles, MD;  Location: [facility]  OR;  Service: Orthopedics;  Laterality: Right;   LAPAROSCOPY     LUMBAR FUSION      Family History  Problem Relation Age of Onset   Diabetes Mother    Hypertension Mother    Glaucoma Brother    Diabetes Maternal Aunt    Diabetes Paternal Aunt    Diabetes Maternal Grandmother    Heart disease Neg Hx     Social History:  reports that she  quit smoking about 7 years ago. Her smoking use included cigarettes. She has never used smokeless tobacco. She reports current alcohol use. She reports that she does not use drugs.    Review of Systems    Lipid history: on Lipitor 10 mg She is taking it regularly with following results    Lab Results  Component Value Date   CHOL 153 11/29/2019   HDL 54.30 11/29/2019   LDLCALC 82 11/29/2019   LDLDIRECT 75.0 03/22/2018   TRIG 81.0 11/29/2019   CHOLHDL 3 11/29/2019           Hypertension:  currently on 39m amlodipine and 10 mg ramipril   Hypertension is followed by PCP, blood pressure appears to be better and she thinks this is from the stress   BP Readings from Last 3 Encounters:  07/19/20 125/72  07/16/20 (!) 152/81  03/16/20 (!) 152/84   Hypokalemia:   Lab Results  Component Value Date   K 4.1 07/18/2020     Most recent foot exam: March 2021   Review of Systems    Physical Examination:  BP 125/72 (BP Location: Left Arm, Patient Position: Sitting, Cuff Size: Normal)   Pulse 91   Ht _0  (1.575 m)   Wt 181 lb 8 oz (82.3 kg)   LMP 07/09/2011   SpO2 99%   BMI 33.20 kg/m    ASSESSMENT:  Diabetes type 2, insulin-dependent  See history of present illness for detailed discussion of current diabetes management, blood sugar patterns and problems identified  She is on Ozempic, premixed insulin twice a day, Farxiga and Metformin  Her A1c is improved at 6.3  Although her blood sugars are being monitored mostly in the morning they are likely variable because of her inconsistent schedule, inconsistent activity level and possibly some variability in her diet when eating out  Also may be getting low normal readings at times including 84 in the lab  HYPERLIPIDEMIA: Has been well-controlled on Lipitor  HYPERTENSION: Blood pressure is relatively improved    PLAN:    Reduce evening insulin to 14 units She will call if she has any continued tendency to blood  sugars in the 70s and 80s in the morning She does need to check her blood sugars more regularly after either lunch or dinner Start regular exercise Follow-up in 4 months   Patient Instructions  Check blood sugars on waking up 3 days a week  Also check blood sugars about 2 hours after meals and do this after different meals by rotation  Recommended blood sugar levels on waking up are 90-130 and about 2 hours after meal is 130-160  Please bring your blood sugar monitor to each visit, thank you  Take 14 units at supper     AElayne Snare7/07/2020, 2:06 PM   Note: This office note was prepared with Dragon voice recognition system technology. Any transcriptional errors that result from this process are unintentional.

## 2020-07-19 NOTE — Patient Instructions (Addendum)
Check blood sugars on waking up 3 days a week  Also check blood sugars about 2 hours after meals and do this after different meals by rotation  Recommended blood sugar levels on waking up are 90-130 and about 2 hours after meal is 130-160  Please bring your blood sugar monitor to each visit, thank you  Take 14 units at supper

## 2020-07-23 ENCOUNTER — Ambulatory Visit: Payer: BC Managed Care – PPO | Admitting: Endocrinology

## 2020-08-03 DIAGNOSIS — R059 Cough, unspecified: Secondary | ICD-10-CM | POA: Diagnosis not present

## 2020-08-03 DIAGNOSIS — U071 COVID-19: Secondary | ICD-10-CM | POA: Diagnosis not present

## 2020-08-03 DIAGNOSIS — R0602 Shortness of breath: Secondary | ICD-10-CM | POA: Diagnosis not present

## 2020-08-03 DIAGNOSIS — J40 Bronchitis, not specified as acute or chronic: Secondary | ICD-10-CM | POA: Diagnosis not present

## 2020-08-11 DIAGNOSIS — Z20822 Contact with and (suspected) exposure to covid-19: Secondary | ICD-10-CM | POA: Diagnosis not present

## 2020-08-14 ENCOUNTER — Encounter (INDEPENDENT_AMBULATORY_CARE_PROVIDER_SITE_OTHER): Payer: BC Managed Care – PPO | Admitting: Ophthalmology

## 2020-08-16 ENCOUNTER — Other Ambulatory Visit: Payer: Self-pay | Admitting: Endocrinology

## 2020-08-16 DIAGNOSIS — Z794 Long term (current) use of insulin: Secondary | ICD-10-CM

## 2020-08-16 DIAGNOSIS — E1165 Type 2 diabetes mellitus with hyperglycemia: Secondary | ICD-10-CM

## 2020-08-21 DIAGNOSIS — E669 Obesity, unspecified: Secondary | ICD-10-CM | POA: Diagnosis not present

## 2020-08-21 DIAGNOSIS — I10 Essential (primary) hypertension: Secondary | ICD-10-CM | POA: Diagnosis not present

## 2020-08-21 DIAGNOSIS — J4520 Mild intermittent asthma, uncomplicated: Secondary | ICD-10-CM | POA: Diagnosis not present

## 2020-08-21 DIAGNOSIS — E782 Mixed hyperlipidemia: Secondary | ICD-10-CM | POA: Diagnosis not present

## 2020-08-27 ENCOUNTER — Other Ambulatory Visit: Payer: Self-pay | Admitting: Endocrinology

## 2020-09-18 DIAGNOSIS — Z794 Long term (current) use of insulin: Secondary | ICD-10-CM

## 2020-09-18 DIAGNOSIS — E1165 Type 2 diabetes mellitus with hyperglycemia: Secondary | ICD-10-CM

## 2020-09-18 MED ORDER — ATORVASTATIN CALCIUM 10 MG PO TABS: 10.0000 mg | ORAL_TABLET | Freq: Every day | ORAL | 3 refills | Status: DC

## 2020-09-18 MED ORDER — HUMALOG MIX 75/25 (75-25) 100 UNIT/ML ~~LOC~~ SUSP: SUBCUTANEOUS | 3 refills | Status: DC

## 2020-09-18 MED ORDER — METFORMIN HCL ER 500 MG PO TB24: 1000.0000 mg | ORAL_TABLET | Freq: Every day | ORAL | 3 refills | Status: DC

## 2020-09-18 MED ORDER — PEN NEEDLES 30G X 5 MM MISC: 1.0000 | Freq: Two times a day (BID) | 2 refills | Status: DC

## 2020-09-18 MED ORDER — DAPAGLIFLOZIN PROPANEDIOL 10 MG PO TABS: 10.0000 mg | ORAL_TABLET | Freq: Every day | ORAL | 3 refills | Status: DC

## 2020-09-18 MED ORDER — OZEMPIC (1 MG/DOSE) 4 MG/3ML ~~LOC~~ SOPN: PEN_INJECTOR | SUBCUTANEOUS | 3 refills | Status: DC

## 2020-09-18 MED ORDER — ACCU-CHEK FASTCLIX LANCETS MISC: 3 refills | Status: DC

## 2020-09-18 MED ORDER — ACCU-CHEK GUIDE VI STRP: ORAL_STRIP | 3 refills | Status: DC

## 2020-09-20 NOTE — Progress Notes (Signed)
[facility] Note  09/26/2020     CHIEF COMPLAINT Patient presents for Retina Follow Up   HISTORY OF PRESENT ILLNESS: Veronica Fletcher is a 55 y.o. female who presents to the clinic today for:  HPI     Retina Follow Up   Patient presents with  PVD.  In left eye.  Severity is mild.  Duration of 10 weeks.  Since onset it is stable.  I, the attending physician,  performed the HPI with the patient and updated documentation appropriately.        Comments   Pt here for 10 wk ret f/u, 6wks late for original appointment at 4wk f/u. Pt states vision is the same, no issues or changes noted. She does note the floater in OS is still there. Blood sugar this AM was 130.       Last edited by Bernarda Caffey, MD on 09/28/2020 10:58 PM.    Pt states she still has the same floater in her left eye, pt states she made an appt at [facility], but pt had to cancel due to covid  Referring physician: [facility] [address] [place],  [place] [address]  HISTORICAL INFORMATION:   Selected notes from the [place] ED referral for eval of floater OS x 3 days   CURRENT MEDICATIONS: No current outpatient medications on file. (Ophthalmic Drugs)   No current facility-administered medications for this visit. (Ophthalmic Drugs)   Current Outpatient Medications (Other)  Medication Sig   Accu-Chek FastClix Lancets MISC Use Accu Chek Fastclix lancets to check blood sugar twice daily.   albuterol (VENTOLIN HFA) 108 (90 Base) MCG/ACT inhaler Inhale 1-2 puffs into the lungs every 4 (four) hours as needed for wheezing or shortness of breath.   amLODipine (NORVASC) 5 MG tablet Take 5 mg by mouth daily.   aspirin EC 81 MG tablet Take 81 mg by mouth daily. Swallow whole.   atorvastatin (LIPITOR) 10 MG tablet Take 1 tablet (10 mg total) by mouth daily at 6 PM.   Blood Glucose Monitoring Suppl (ACCU-CHEK GUIDE) w/Device KIT 1 each by Does not apply  route 2 (two) times a day. Use as instructed to check blood sugar twice daily.   celecoxib (CELEBREX) 200 MG capsule celecoxib 200 mg capsule  TAKE 1 CAPSULE EVERY DAY BY ORAL ROUTE IN THE MORNING FOR 30 DAYS.   dapagliflozin propanediol (FARXIGA) 10 MG TABS tablet Take 1 tablet (10 mg total) by mouth daily before breakfast.   enoxaparin (LOVENOX) 40 MG/0.4ML injection Lovenox 40 mg/0.4 mL subcutaneous syringe  Inject 0.4 mL every day by subcutaneous route for 10 days.  When finished with 10 day course of lovenox transition to Daily aspirin   fluticasone (FLONASE) 50 MCG/ACT nasal spray Place 1 spray into both nostrils daily as needed for allergies or rhinitis.   fluticasone (FLOVENT HFA) 110 MCG/ACT inhaler Flovent HFA 110 mcg/actuation aerosol inhaler   gabapentin (NEURONTIN) 300 MG capsule Take 300 mg by mouth at bedtime.   gabapentin (NEURONTIN) 300 MG capsule gabapentin 300 mg capsule  Take 1 capsule every day by oral route at bedtime for 15 days.   glucose blood (ACCU-CHEK GUIDE) test strip Use to check blood sugar twice daily.   insulin aspart protamine - aspart (NOVOLOG MIX 70/30 FLEXPEN) (70-30) 100 UNIT/ML FlexPen Novolog Mix 70-30 FlexPen U-100 Insulin 100 unit/mL subcutaneous pen  INJECT 18 UNITS IN THE MORNING AND 16 UNITS IN EVENING  insulin lispro protamine-lispro (HUMALOG MIX 75/25) (75-25) 100 UNIT/ML SUSP injection Inject 18 units in A.M and 16 units in P.M  (To replace novolog 70/30 per insurance request)   Insulin Pen Needle (B-D UF III MINI PEN NEEDLES) 31G X 5 MM MISC Use twice daily to inject insulin   meloxicam (MOBIC) 15 MG tablet meloxicam 15 mg tablet  TAKE 1 TABLET EVERY DAY BY ORAL ROUTE FOR 30 DAYS.   Menthol, Topical Analgesic, (BIOFREEZE ROLL-ON) 4 % GEL Biofreeze (menthol) 4 % topical gel  Apply 1 application 4 times a day by topical route as needed for 30 days.   metFORMIN (GLUCOPHAGE-XR) 500 MG 24 hr tablet Take 2 tablets (1,000 mg total) by mouth daily.    methocarbamol (ROBAXIN) 500 MG tablet methocarbamol 500 mg tablet   ondansetron (ZOFRAN) 4 MG tablet Take 1 tablet (4 mg total) by mouth every 8 (eight) hours as needed for nausea or vomiting.   ondansetron (ZOFRAN) 8 MG tablet ondansetron HCl 8 mg tablet   oxyCODONE-acetaminophen (PERCOCET) 10-325 MG tablet oxycodone-acetaminophen 10 mg-325 mg tablet  TAKE 1 TABLET BY MOUTH THREE TIMES A DAY AS NEEDED   ramipril (ALTACE) 10 MG capsule Take 10 mg by mouth daily.   Semaglutide, 1 MG/DOSE, (OZEMPIC, 1 MG/DOSE,) 4 MG/3ML SOPN INJECT 1 MG(0.75ML) UNDER THE SKIN ONE DAY A WEEK   topiramate (TOPAMAX) 25 MG tablet topiramate 25 mg tablet  TAKE 1 TABLET BY MOUTH EVERY DAY AT NIGHT   topiramate (TOPAMAX) 50 MG tablet Take 25 mg by mouth at bedtime.    traMADol (ULTRAM) 50 MG tablet tramadol 50 mg tablet  TAKE 1 TABLET TWICE A DAY BY ORAL ROUTE AS NEEDED FOR 20 DAYS.   No current facility-administered medications for this visit. (Other)   REVIEW OF SYSTEMS: ROS   Positive for: Musculoskeletal, Endocrine, Cardiovascular, Eyes, Respiratory Negative for: Constitutional, Gastrointestinal, Neurological, Skin, Genitourinary, HENT, Psychiatric, Allergic/Imm, Heme/Lymph Last edited by Kingsley Spittle, COT on 09/26/2020  8:49 AM.     ALLERGIES Allergies  Allergen Reactions   Liraglutide Other (See Comments)    Other reaction(s): Pancreatitis  *Victoza     PAST MEDICAL HISTORY Past Medical History:  Diagnosis Date   Anemia    Asthma    Diabetes mellitus    Family history of adverse reaction to anesthesia    Brother   Heart murmur    Hyperlipidemia    Hypertension    Pancreatitis    Pneumonia    Past Surgical History:  Procedure Laterality Date   ABDOMINAL HYSTERECTOMY     ANTERIOR LAT LUMBAR FUSION N/A 07/07/2019   Procedure: ANTERIOR LATERAL LUMBAR FUSION (XLIF) LUMBAR TWO THROUGH LUMBAR THREE, POSTERIOR SPINAL FUSION INTERBODY LUMBAR TWO THROUGH LUMBAR THREE;  Surgeon: Melina Schools, MD;  Location: [facility];  Service: Orthopedics;  Laterality: N/A;  4 HRS   CESAREAN SECTION     CHOLECYSTECTOMY     GANGLION CYST EXCISION     HIP CAPSULECTOMY Right 10/14/2018   Procedure: RIGHT HIP HETEROTOPIC OSSIFICATION RESECTION;  Surgeon: Shona Needles, MD;  Location: [facility];  Service: Orthopedics;  Laterality: Right;   LAPAROSCOPY     LUMBAR FUSION      FAMILY HISTORY Family History  Problem Relation Age of Onset   Diabetes Mother    Hypertension Mother    Glaucoma Brother    Diabetes Maternal Aunt    Diabetes Paternal Aunt    Diabetes Maternal Grandmother    Heart disease Neg Hx  SOCIAL HISTORY Social History   Tobacco Use   Smoking status: Former    Types: Cigarettes    Quit date: 01/23/2013    Years since quitting: 7.6   Smokeless tobacco: Never  Vaping Use   Vaping Use: Never used  Substance Use Topics   Alcohol use: Yes    Comment: rarely   Drug use: No         OPHTHALMIC EXAM:  Base Eye Exam     Visual Acuity (Snellen - Linear)       Right Left   Dist cc 20/20 20/20    Correction: Glasses         Tonometry (Tonopen, 8:56 AM)       Right Left   Pressure 19 20         Pupils       Dark Light Shape React APD   Right 4 3 Round Brisk None   Left 4 3 Round Brisk None         Visual Fields (Counting fingers)       Left Right    Full Full         Extraocular Movement       Right Left    Full, Ortho Full, Ortho         Neuro/Psych     Oriented x3: Yes   Mood/Affect: Normal         Dilation     Both eyes: 1.0% Mydriacyl, 2.5% Phenylephrine @ 8:57 AM           Slit Lamp and Fundus Exam     Slit Lamp Exam       Right Left   Lids/Lashes Dermatochalasis - upper lid Dermatochalasis - upper lid   Conjunctiva/Sclera Melanosis Melanosis   Cornea trace EBMD, mild tear film debris trace EBMD, trace arcus, mild tear film debris   Anterior Chamber Deep and quiet Deep and quiet   Iris Round and dilated,  No NVI Round and dilated, No NVI   Lens 2+ Nuclear sclerosis, 2+ Cortical cataract 2+ Nuclear sclerosis, 2+ Cortical cataract   Vitreous Vitreous syneresis Vitreous syneresis, Posterior vitreous detachment, Weiss ring overlying disc -- mobile         Fundus Exam       Right Left   Disc +Pallor, +cupping, temporal PPAPPP, Thin inferior rim +Pallor, +cupping, temporal PPA/PPP, Thin inferior rim, mild tilt   C/D Ratio 0.8 0.7   Macula Flat, Blunted foveal reflex, mild RPE mottling, No heme or edema Flat, Blunted foveal reflex, mild RPE mottling, No heme or edema   Vessels attenuated, mild tortuousity attenuated, mild tortuousity   Periphery Attached, no heme, no RT/RD, mild pigmented cystoid degeneration inferiorly Attached, no heme, no RT/RD, mild pigmented cystoid degeneration inferiorly and temporally, No RT/RD on 360 scleral depression            Refraction     Wearing Rx       Sphere Cylinder Axis Add   Right -5.50 +0.25 091 +1.75   Left -5.75 +0.50 055 +1.75    Type: PAL            IMAGING AND PROCEDURES  Imaging and Procedures for 09/26/2020  OCT, Retina - OU - Both Eyes       Right Eye Quality was good. Central Foveal Thickness: 223. Progression has been stable. Findings include normal foveal contour, no IRF, no SRF (Partial PVD).   Left Eye Quality was good. Central Foveal Thickness:  222. Progression has been stable. Findings include normal foveal contour, no IRF, no SRF (Focal ellipsoid disruption nasal mac, trace vitreous opacities).   Notes *Images captured and stored on drive  Diagnosis / Impression:  NFP, no IRF/SRF OD: partial PVD OS: Focal ellipsoid disruption nasal mac, trace vitreous opacities  Clinical management:  See below  Abbreviations: NFP - Normal foveal profile. CME - cystoid macular edema. PED - pigment epithelial detachment. IRF - intraretinal fluid. SRF - subretinal fluid. EZ - ellipsoid zone. ERM - epiretinal membrane. ORA - outer  retinal atrophy. ORT - outer retinal tubulation. SRHM - subretinal hyper-reflective material. IRHM - intraretinal hyper-reflective material            ASSESSMENT/PLAN:    ICD-10-CM   1. Posterior vitreous detachment of left eye  H43.812     2. Diabetes mellitus type 2 without retinopathy ([place])  E11.9     3. Retinal edema  H35.81 OCT, Retina - OU - Both Eyes    4. Combined forms of age-related cataract of both eyes  H25.813     5. Bilateral ocular hypertension  H40.053      1. PVD / vitreous syneresis OS  - acute, symptomatic floater OS -- onset Saturday, 7.2.22  - no photopsias  - Discussed findings and prognosis  - No RT or RD on 360 peripheral exam  - Reviewed s/s of RT/RD  - Strict return precautions for any such RT/RD signs/symptoms - pt is cleared from a retina standpoint for release to [facility] for establishment of primary eye care  - f/u here PRN  2,3. Diabetes mellitus, type 2 without retinopathy - The incidence, risk factors for progression, natural history and treatment options for diabetic retinopathy  were discussed with patient.   - The need for close monitoring of blood glucose, blood pressure, and serum lipids, avoiding cigarette or any type of tobacco, and the need for long term follow up was also discussed with patient. - f/u in 1 year, sooner prn  4. Mixed Cataract OU - The symptoms of cataract, surgical options, and treatments and risks were discussed with patient. - discussed diagnosis and progression - monitor  5. Glaucoma suspect / Ocular Hypertension OU - IOP today: 19,20 - +cupping OU - +family hx of glaucoma - will refer to [facility] for glc eval and management   Ophthalmic Meds Ordered this visit:  No orders of the defined types were placed in this encounter.      Return if symptoms worsen or fail to improve.  There are no Patient Instructions on file for this visit.  This document serves as a record of services  personally performed by Gardiner Sleeper, MD, PhD. It was created on their behalf by Orvan Falconer, an ophthalmic technician. The creation of this record is the provider's dictation and/or activities during the visit.    Electronically signed by: Orvan Falconer, OA, 09/28/20  11:11 PM   This document serves as a record of services personally performed by Gardiner Sleeper, MD, PhD. It was created on their behalf by San Jetty. Owens Shark, OA an ophthalmic technician. The creation of this record is the provider's dictation and/or activities during the visit.    Electronically signed by: San Jetty. Marguerita Merles 09.14.2022 11:11 PM   Gardiner Sleeper, M.D., Ph.D. Diseases & Surgery of the Retina and Vitreous [facility]  I have reviewed the above documentation for accuracy and completeness, and I agree with the  above. Gardiner Sleeper, M.D., Ph.D. 09/28/20 11:15 PM   Abbreviations: M myopia (nearsighted); A astigmatism; H hyperopia (farsighted); P presbyopia; Mrx spectacle prescription;  CTL contact lenses; OD right eye; OS left eye; OU both eyes  XT exotropia; ET esotropia; PEK punctate epithelial keratitis; PEE punctate epithelial erosions; DES dry eye syndrome; MGD meibomian gland dysfunction; ATs artificial tears; PFAT's preservative free artificial tears; [place] nuclear sclerotic cataract; PSC posterior subcapsular cataract; ERM epi-retinal membrane; PVD posterior vitreous detachment; RD retinal detachment; DM diabetes mellitus; DR diabetic retinopathy; NPDR non-proliferative diabetic retinopathy; PDR proliferative diabetic retinopathy; CSME clinically significant macular edema; DME diabetic macular edema; dbh dot blot hemorrhages; CWS cotton wool spot; POAG primary open angle glaucoma; C/D cup-to-disc ratio; HVF humphrey visual field; GVF goldmann visual field; OCT optical coherence tomography; IOP intraocular pressure; BRVO Branch retinal vein occlusion; CRVO central retinal vein  occlusion; CRAO central retinal artery occlusion; BRAO branch retinal artery occlusion; RT retinal tear; SB scleral buckle; PPV pars plana vitrectomy; VH Vitreous hemorrhage; PRP panretinal laser photocoagulation; IVK intravitreal kenalog; VMT vitreomacular traction; MH Macular hole;  NVD neovascularization of the disc; NVE neovascularization elsewhere; AREDS age related eye disease study; ARMD age related macular degeneration; POAG primary open angle glaucoma; EBMD epithelial/anterior basement membrane dystrophy; ACIOL anterior chamber intraocular lens; IOL intraocular lens; PCIOL posterior chamber intraocular lens; Phaco/IOL phacoemulsification with intraocular lens placement; [place] photorefractive keratectomy; LASIK laser assisted in situ keratomileusis; HTN hypertension; DM diabetes mellitus; COPD chronic obstructive pulmonary disease

## 2020-09-26 ENCOUNTER — Other Ambulatory Visit: Payer: Self-pay

## 2020-09-26 ENCOUNTER — Encounter (INDEPENDENT_AMBULATORY_CARE_PROVIDER_SITE_OTHER): Payer: Self-pay | Admitting: Ophthalmology

## 2020-09-26 ENCOUNTER — Ambulatory Visit (INDEPENDENT_AMBULATORY_CARE_PROVIDER_SITE_OTHER): Payer: BC Managed Care – PPO | Admitting: Ophthalmology

## 2020-09-26 DIAGNOSIS — H43812 Vitreous degeneration, left eye: Secondary | ICD-10-CM | POA: Diagnosis not present

## 2020-09-26 DIAGNOSIS — E119 Type 2 diabetes mellitus without complications: Secondary | ICD-10-CM

## 2020-09-26 DIAGNOSIS — H40053 Ocular hypertension, bilateral: Secondary | ICD-10-CM

## 2020-09-26 DIAGNOSIS — H3581 Retinal edema: Secondary | ICD-10-CM

## 2020-09-26 DIAGNOSIS — H25813 Combined forms of age-related cataract, bilateral: Secondary | ICD-10-CM | POA: Diagnosis not present

## 2020-09-27 ENCOUNTER — Other Ambulatory Visit: Payer: Self-pay

## 2020-09-27 DIAGNOSIS — E1165 Type 2 diabetes mellitus with hyperglycemia: Secondary | ICD-10-CM

## 2020-09-27 DIAGNOSIS — Z794 Long term (current) use of insulin: Secondary | ICD-10-CM

## 2020-09-27 MED ORDER — BD PEN NEEDLE MINI U/F 31G X 5 MM MISC: 3 refills | Status: DC

## 2020-09-28 ENCOUNTER — Encounter (INDEPENDENT_AMBULATORY_CARE_PROVIDER_SITE_OTHER): Payer: Self-pay | Admitting: Ophthalmology

## 2020-10-01 DIAGNOSIS — H25813 Combined forms of age-related cataract, bilateral: Secondary | ICD-10-CM | POA: Diagnosis not present

## 2020-10-01 DIAGNOSIS — E119 Type 2 diabetes mellitus without complications: Secondary | ICD-10-CM | POA: Diagnosis not present

## 2020-10-01 DIAGNOSIS — H43812 Vitreous degeneration, left eye: Secondary | ICD-10-CM | POA: Diagnosis not present

## 2020-10-01 DIAGNOSIS — H40053 Ocular hypertension, bilateral: Secondary | ICD-10-CM | POA: Diagnosis not present

## 2020-10-11 DIAGNOSIS — Z794 Long term (current) use of insulin: Secondary | ICD-10-CM

## 2020-10-12 MED ORDER — INSULIN LISPRO PROT & LISPRO (75-25 MIX) 100 UNIT/ML KWIKPEN: PEN_INJECTOR | SUBCUTANEOUS | 3 refills | Status: DC

## 2020-11-09 ENCOUNTER — Other Ambulatory Visit (HOSPITAL_COMMUNITY): Payer: Self-pay

## 2020-11-09 ENCOUNTER — Telehealth: Payer: Self-pay | Admitting: Pharmacy Technician

## 2020-11-09 NOTE — Telephone Encounter (Signed)
Patient Teacher, English as a foreign language completed.    The current 26 day co-pay is, $95.   The patient is insured through [employer] No PA needed at this time.    [name], [facility] Patient [facility] Phone: [phone] Fax: [phone]

## 2020-11-15 ENCOUNTER — Other Ambulatory Visit (HOSPITAL_COMMUNITY): Payer: Self-pay

## 2020-11-20 ENCOUNTER — Other Ambulatory Visit: Payer: BC Managed Care – PPO

## 2020-11-21 ENCOUNTER — Other Ambulatory Visit (INDEPENDENT_AMBULATORY_CARE_PROVIDER_SITE_OTHER): Payer: BC Managed Care – PPO

## 2020-11-21 ENCOUNTER — Other Ambulatory Visit: Payer: Self-pay

## 2020-11-21 DIAGNOSIS — E7800 Pure hypercholesterolemia, unspecified: Secondary | ICD-10-CM | POA: Diagnosis not present

## 2020-11-21 DIAGNOSIS — Z794 Long term (current) use of insulin: Secondary | ICD-10-CM

## 2020-11-21 DIAGNOSIS — E1165 Type 2 diabetes mellitus with hyperglycemia: Secondary | ICD-10-CM | POA: Diagnosis not present

## 2020-11-21 LAB — LIPID PANEL
Cholesterol: 185 mg/dL (ref 0–200)
HDL: 50.7 mg/dL (ref >39.00)
LDL Cholesterol: 113 mg/dL — ABNORMAL HIGH (ref 0–99)
NonHDL: 134.49
Total CHOL/HDL Ratio: 4
Triglycerides: 105 mg/dL (ref 0.0–149.0)
VLDL: 21 mg/dL (ref 0.0–40.0)

## 2020-11-21 LAB — COMPREHENSIVE METABOLIC PANEL
ALT: 13 U/L (ref 0–35)
AST: 19 U/L (ref 0–37)
Albumin: 4.4 g/dL (ref 3.5–5.2)
Alkaline Phosphatase: 87 U/L (ref 39–117)
BUN: 16 mg/dL (ref 6–23)
CO2: 28 mEq/L (ref 19–32)
Calcium: 9.5 mg/dL (ref 8.4–10.5)
Chloride: 102 mEq/L (ref 96–112)
Creatinine, Ser: 0.86 mg/dL (ref 0.40–1.20)
GFR: 76.15 mL/min (ref >60.00)
Glucose, Bld: 141 mg/dL — ABNORMAL HIGH (ref 70–99)
Potassium: 4.2 mEq/L (ref 3.5–5.1)
Sodium: 138 mEq/L (ref 135–145)
Total Bilirubin: 0.6 mg/dL (ref 0.2–1.2)
Total Protein: 7.2 g/dL (ref 6.0–8.3)

## 2020-11-21 LAB — HEMOGLOBIN A1C: Hgb A1c MFr Bld: 6.8 % — ABNORMAL HIGH (ref 4.6–6.5)

## 2020-11-22 ENCOUNTER — Ambulatory Visit (INDEPENDENT_AMBULATORY_CARE_PROVIDER_SITE_OTHER): Payer: BC Managed Care – PPO | Admitting: Endocrinology

## 2020-11-22 ENCOUNTER — Encounter: Payer: Self-pay | Admitting: Endocrinology

## 2020-11-22 VITALS — BP 122/78 | HR 95 | Ht 62.0 in | Wt 182.4 lb

## 2020-11-22 DIAGNOSIS — I10 Essential (primary) hypertension: Secondary | ICD-10-CM

## 2020-11-22 DIAGNOSIS — E1165 Type 2 diabetes mellitus with hyperglycemia: Secondary | ICD-10-CM | POA: Diagnosis not present

## 2020-11-22 DIAGNOSIS — E7800 Pure hypercholesterolemia, unspecified: Secondary | ICD-10-CM

## 2020-11-22 DIAGNOSIS — Z23 Encounter for immunization: Secondary | ICD-10-CM

## 2020-11-22 DIAGNOSIS — Z794 Long term (current) use of insulin: Secondary | ICD-10-CM

## 2020-11-22 IMAGING — CT CT PELVIS WITHOUT CONTRAST
2 of 6 series · 15 of 46 positions shown, 17 images · non-contrast
Comparison: Multiple priors dating back to February 07, 2013.

CLINICAL DATA: Ossification at the right hip right hip pain

EXAM:
CT PELVIS WITHOUT CONTRAST
TECHNIQUE: Multidetector CT imaging of the pelvis was performed following the
standard protocol without intravenous contrast.

[Series 8: pelvis 2.00 br40 s3 axial st · axial · 0.66mm/px · z∈[+1151,+1351]mm · 12 of 116 slices shown, 14 images (1 of 2)]
[im 8/116  soft-tissue]
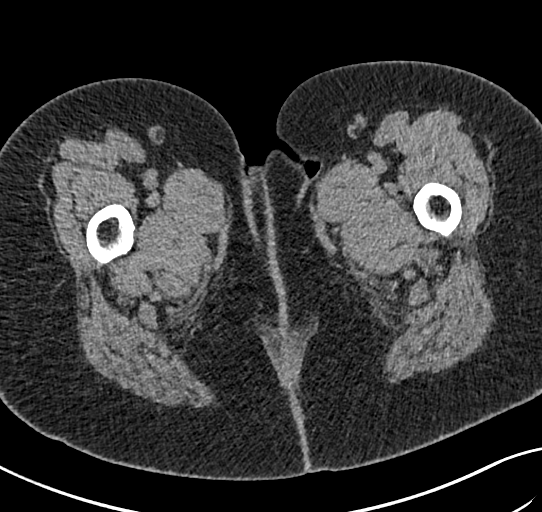
[im 8/116  bone]
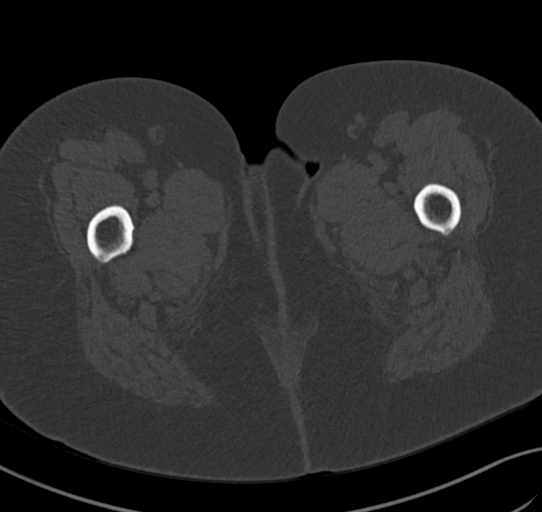
[im 16/116  soft-tissue]
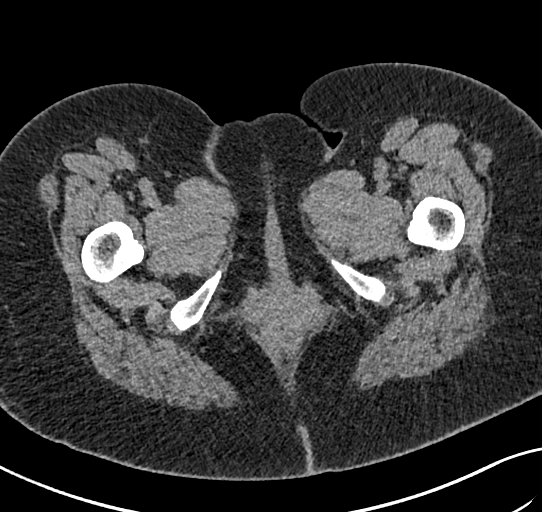
[im 24/116  soft-tissue]
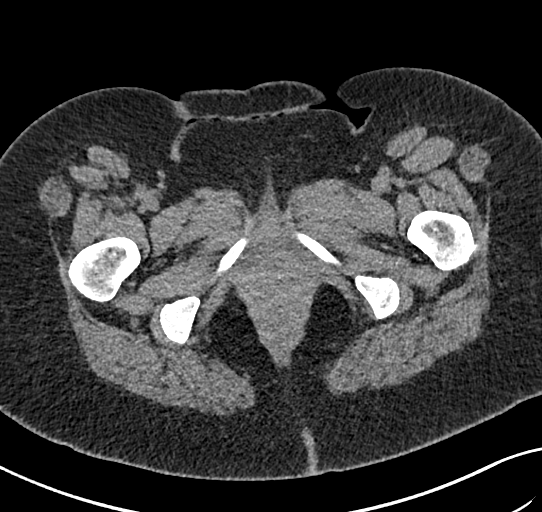
[im 39/116  soft-tissue]
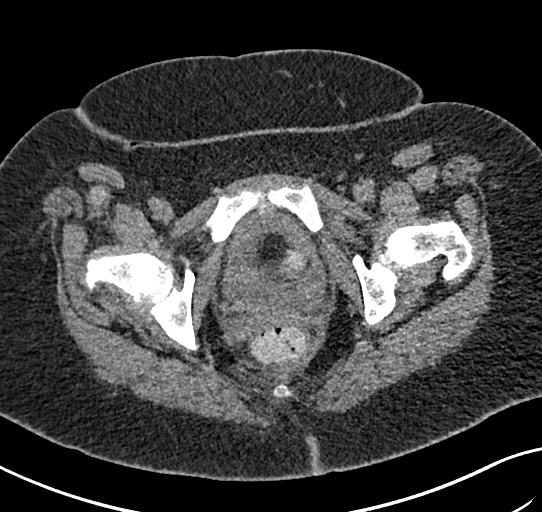
[im 47/116  soft-tissue]
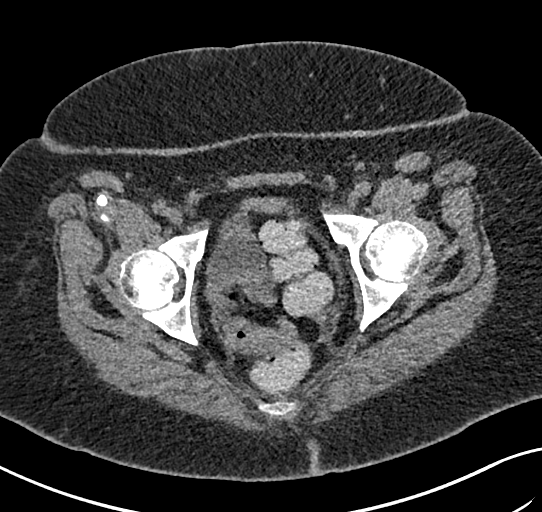
[im 54/116  soft-tissue]
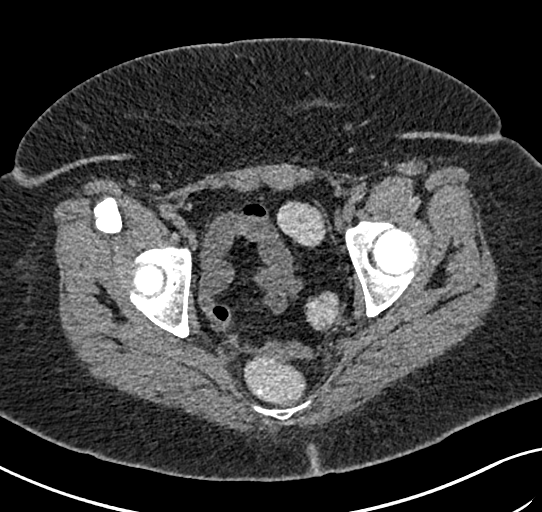
[im 62/116  soft-tissue]
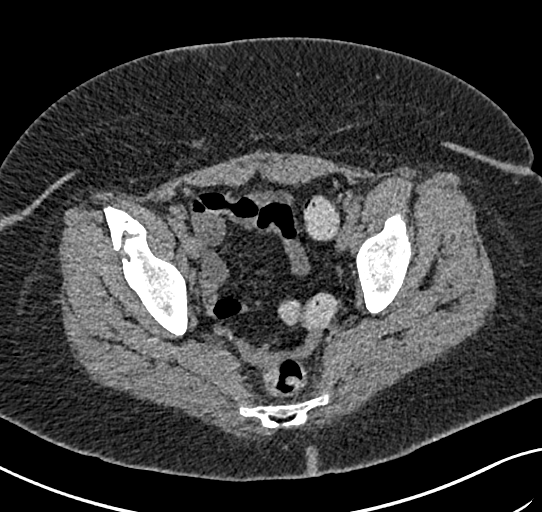
[im 70/116  soft-tissue]
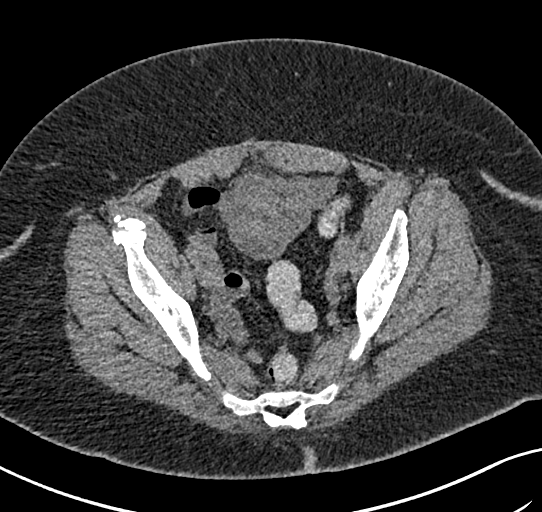
[im 77/116  soft-tissue]
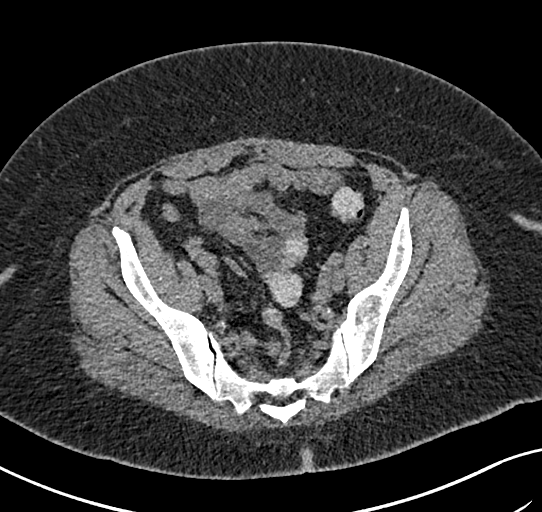
[im 77/116  bone]
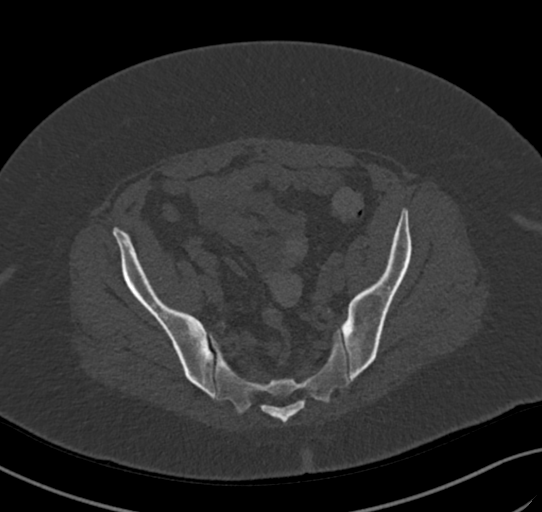
[im 93/116  soft-tissue]
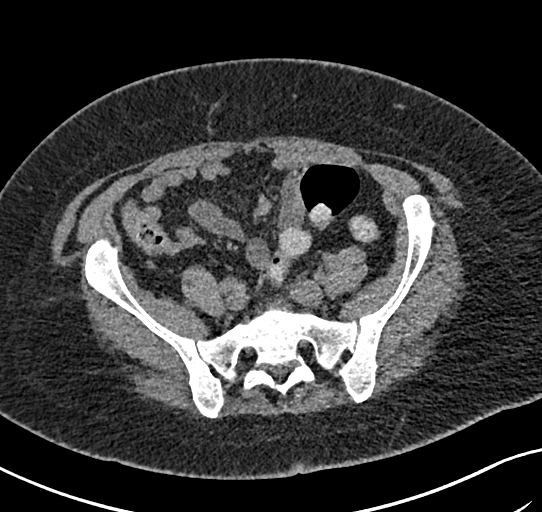
[im 100/116  soft-tissue]
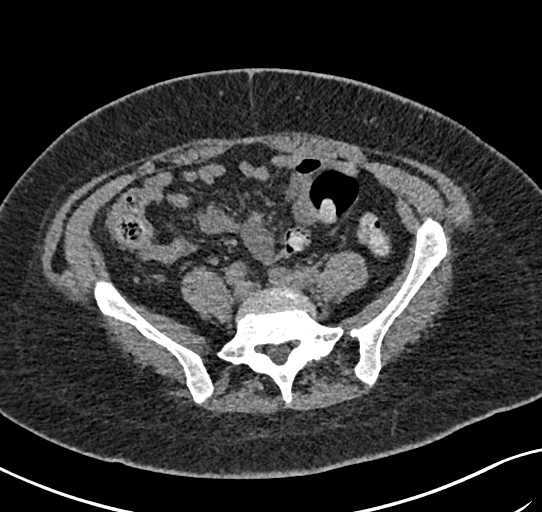
[im 108/116  soft-tissue]
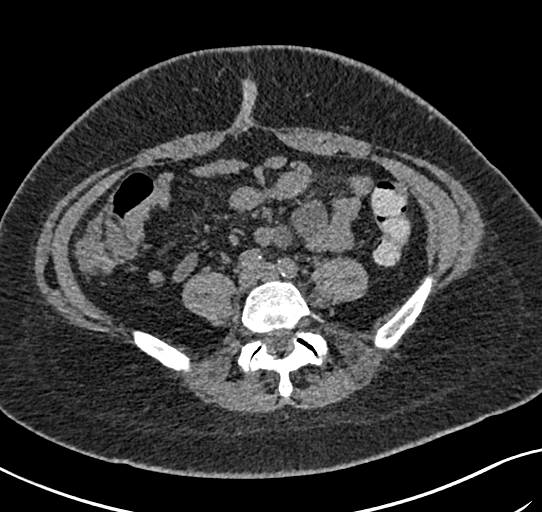

[Series 10: pelvis 2.00 br40 s3 axial st · coronal · 0.46mm/px · 3 of 169 slices shown (2 of 2)]
[im 43/169  soft-tissue]
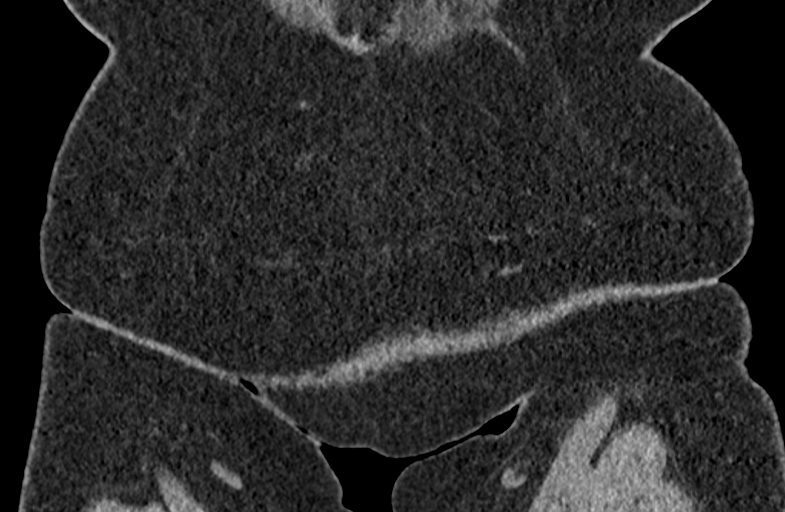
[im 85/169  soft-tissue]
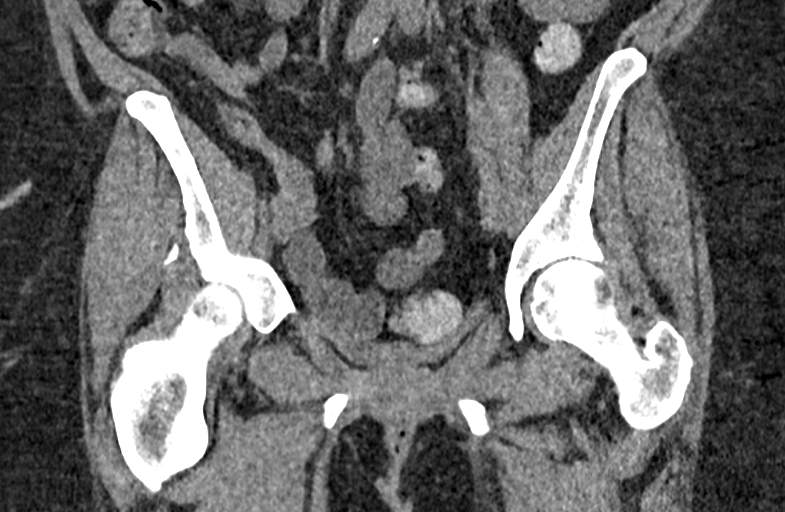
[im 127/169  soft-tissue]
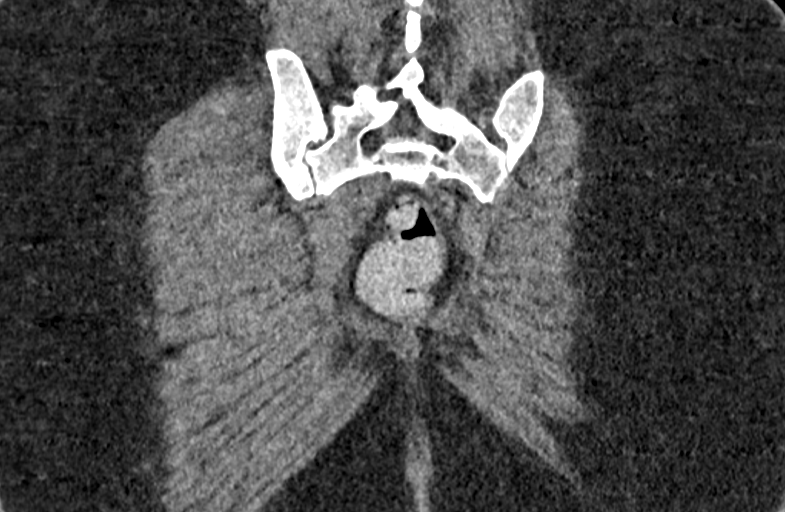

[15 of 46 positions shown; findings below may reference images not displayed]

FINDINGS: Urinary Tract:  No abnormality visualized.

Bowel: Visualized portions of the small bowel and colon are normal
in appearance. No inflammatory changes, wall thickening, or
obstructive findings. The appendix is normal.

Vascular/Lymphatic: No pathologically enlarged lymph nodes.
Scattered atherosclerosis is noted within the aorta and iliacs
without aneurysmal dilatation.

Reproductive:  No mass or other significant abnormality

Other:  None.

Musculoskeletal: Again noted is a chronic nonunited fracture seen of
the superior right iliac wing with fragmented heterotopic
ossification extending anteriorly. There is a large 3.2 cm
heterotopic ossification anterior to the superior acetabulum and
then there are small fragmented heterotopic ossification extending
into the rectus femoris musculature. There is mild bilateral hip
osteoarthritis with superior joint space loss subchondral sclerosis.
The tendons are intact. There is normal appearance to the remainder
of the muscles surrounding the pelvis.
IMPRESSION: Chronic you would nonunited fracture of the superior right iliac
wing with adjacent large amount heterotopic ossification extending
anteriorly into the rectus femoris musculature. This is unchanged in
appearance since CT 8807

## 2020-11-22 MED ORDER — HUMALOG MIX 75/25 (75-25) 100 UNIT/ML ~~LOC~~ SUSP: 18.0000 [IU] | Freq: Two times a day (BID) | SUBCUTANEOUS | 1 refills | Status: DC

## 2020-11-22 NOTE — Patient Instructions (Addendum)
Check blood sugars on waking up 4 days a week  Also check blood sugars about 2 hours after meals and do this after different meals by rotation  Recommended blood sugar levels on waking up are 90-130 and about 2 hours after meal is 130-180  Please bring your blood sugar monitor to each visit, thank you  Take Lipitor daily

## 2020-11-22 NOTE — Progress Notes (Signed)
Patient ID: Veronica Fletcher, female   DOB: 08/03/1965, 55 y.o.   MRN: [id number]           Reason for Appointment: Follow-up for Type 2 Diabetes  Referring physician: Harrington Challenger  History of Present Illness:          Date of diagnosis of type 2 diabetes mellitus: 2005       Background history:  She had been started on metformin initially and not clear what other treatment she had taken subsequently, records are being awaited today She apparently was doing very well with a combination of Victoza and metformin until 2015 and she thinks she was losing weight with this.  She had taken Victoza for several months at least However according to his lab records her best A1c in 2014 was only 7.9 Apparently she was taken off Victoza when she was admitted for abdominal pain presumed to be from pancreatitis in 01/2013 She was then started on premixed insulin and taken off metformin also on discharge. She was referred here for poorly controlled diabetes with A1c consistently around 10% On her initial consultation she was given metformin and Jardiance in addition to her insulin  V-go pump 20 unit basal and boluses 4 units previously stopped  Recent history:   INSULIN regimen is: Humalog Mix at meals: 18 a.m.-16 before dinner   Non-insulin hypoglycemic drugs: Farxiga 10 mg daily, metformin ER 1000 mg daily Ozempic 1.0 mg weekly  Her A1c is at the same as before at 6.8  Current management, blood sugar patterns and problems identified: She has 2 different monitors and has only brought one of them  Her fasting readings are excellent although fluctuating as before  Since she had low normal sugars overnight she was told to try only 14 units of insulin at suppertime  However she only reduces this when her Premeal blood sugar is low normal at dinnertime  She has been able to take all her medications and insulin regularly although having some difficulty getting mail-order supplies and regular refills on her  insulin  She is not taking Humalog mix instead of NovoLog mix  Because of various issues she has not been able to exercise much She does know to take her insulin doses before starting to eat Hypoglycemia No side effects with Ozempic or Farxiga and renal function was within normal   Side effects from medications have been:?  Victoza caused pancreatitis  Compliance with the medical regimen: Good   Glucose monitoring:  done 0.5 times a day         Glucometer:  Accu-Chek guide   Blood Glucose readings from download as below   PRE-MEAL Fasting Lunch Dinner Bedtime Overall  Glucose range: 97-176    96-251  Mean/median: 139 196  122 143   POST-MEAL PC Breakfast PC Lunch PC Dinner  Glucose range:   ?  Mean/median:     ' Previously  PRE-MEAL Fasting Lunch Dinner Bedtime Overall  Glucose range: 80-175    80-175  Mean/median: 136    133   POST-MEAL PC Breakfast PC Lunch PC Dinner  Glucose range:   167  Mean/median:        :Self-care: The diet that the patient has been following is: tries to limit portions.      Typical meal intake: Breakfast is oatmeal/yogurt, lunch is a grilled chicken, evening meal is meat and 2 vegetables, will have snacks on yogurt    Dinner at 8 pm  Dietician visit, most recent:2014                Weight history:   Wt Readings from Last 3 Encounters:  11/22/20 182 lb 6.4 oz (82.7 kg)  07/19/20 181 lb 8 oz (82.3 kg)  07/16/20 183 lb (83 kg)    Glycemic control:   Lab Results  Component Value Date   HGBA1C 6.8 (H) 11/21/2020   HGBA1C 6.3 07/18/2020   HGBA1C 6.6 (H) 03/13/2020   Lab Results  Component Value Date   MICROALBUR <0.7 11/29/2019   LDLCALC 113 (H) 11/21/2020   CREATININE 0.86 11/21/2020   Lab Results  Component Value Date   MICRALBCREAT 0.9 11/29/2019    Lab Results  Component Value Date   FRUCTOSAMINE 293 (H) 03/22/2018   FRUCTOSAMINE 250 08/12/2017   FRUCTOSAMINE 290 (H) 06/16/2017   FRUCTOSAMINE 284 10/22/2016      Lab on 11/21/2020  Component Date Value Ref Range Status   Cholesterol 11/21/2020 185  0 - 200 mg/dL Final   ATP III Classification       Desirable:  < 200 mg/dL               Borderline High:  200 - 239 mg/dL          High:  > = 240 mg/dL   Triglycerides 11/21/2020 105.0  0.0 - 149.0 mg/dL Final   Normal:  <150 mg/dLBorderline High:  150 - 199 mg/dL   HDL 11/21/2020 50.70  >39.00 mg/dL Final   VLDL 11/21/2020 21.0  0.0 - 40.0 mg/dL Final   LDL Cholesterol 11/21/2020 113 (A)  0 - 99 mg/dL Final   Total CHOL/HDL Ratio 11/21/2020 4   Final                  Men          Women1/2 Average Risk     3.4          3.3Average Risk          5.0          4.42X Average Risk          9.6          7.13X Average Risk          15.0          11.0                       NonHDL 11/21/2020 134.49   Final   NOTE:  Non-HDL goal should be 30 mg/dL higher than patient's LDL goal (i.e. LDL goal of < 70 mg/dL, would have non-HDL goal of < 100 mg/dL)   Sodium 11/21/2020 138  135 - 145 mEq/L Final   Potassium 11/21/2020 4.2  3.5 - 5.1 mEq/L Final   Chloride 11/21/2020 102  96 - 112 mEq/L Final   CO2 11/21/2020 28  19 - 32 mEq/L Final   Glucose, Bld 11/21/2020 141 (A)  70 - 99 mg/dL Final   BUN 11/21/2020 16  6 - 23 mg/dL Final   Creatinine, Ser 11/21/2020 0.86  0.40 - 1.20 mg/dL Final   Total Bilirubin 11/21/2020 0.6  0.2 - 1.2 mg/dL Final   Alkaline Phosphatase 11/21/2020 87  39 - 117 U/L Final   AST 11/21/2020 19  0 - 37 U/L Final   ALT 11/21/2020 13  0 - 35 U/L Final   Total Protein 11/21/2020 7.2  6.0 - 8.3 g/dL Final  Albumin 11/21/2020 4.4  3.5 - 5.2 g/dL Final   GFR 11/21/2020 76.15  >60.00 mL/min Final   Calculated using the CKD-EPI Creatinine Equation (2021)   Calcium 11/21/2020 9.5  8.4 - 10.5 mg/dL Final   Hgb A1c MFr Bld 11/21/2020 6.8 (A)  4.6 - 6.5 % Final   Glycemic Control Guidelines for People with Diabetes:Non Diabetic:  <6%Goal of Therapy: <7%Additional Action Suggested:  >8%        Allergies as of 11/22/2020       Reactions   Liraglutide Other (See Comments)   Other reaction(s): Pancreatitis *Victoza         Medication List        Accurate as of November 22, 2020  8:56 AM. If you have any questions, ask your nurse or doctor.          Accu-Chek FastClix Lancets Misc Use Accu Chek Fastclix lancets to check blood sugar twice daily.   Accu-Chek Guide test strip Generic drug: glucose blood Use to check blood sugar twice daily.   Accu-Chek Guide w/Device Kit 1 each by Does not apply route 2 (two) times a day. Use as instructed to check blood sugar twice daily.   albuterol 108 (90 Base) MCG/ACT inhaler Commonly known as: VENTOLIN HFA Inhale 1-2 puffs into the lungs every 4 (four) hours as needed for wheezing or shortness of breath.   amLODipine 5 MG tablet Commonly known as: NORVASC Take 5 mg by mouth daily.   aspirin EC 81 MG tablet Take 81 mg by mouth daily. Swallow whole.   atorvastatin 10 MG tablet Commonly known as: LIPITOR Take 1 tablet (10 mg total) by mouth daily at 6 PM.   B-D UF III MINI PEN NEEDLES 31G X 5 MM Misc Generic drug: Insulin Pen Needle Use twice daily to inject insulin   Biofreeze Roll-On 4 % Gel Generic drug: Menthol (Topical Analgesic) Biofreeze (menthol) 4 % topical gel  Apply 1 application 4 times a day by topical route as needed for 30 days.   celecoxib 200 MG capsule Commonly known as: CELEBREX celecoxib 200 mg capsule  TAKE 1 CAPSULE EVERY DAY BY ORAL ROUTE IN THE MORNING FOR 30 DAYS.   dapagliflozin propanediol 10 MG Tabs tablet Commonly known as: Farxiga Take 1 tablet (10 mg total) by mouth daily before breakfast.   enoxaparin 40 MG/0.4ML injection Commonly known as: LOVENOX Lovenox 40 mg/0.4 mL subcutaneous syringe  Inject 0.4 mL every day by subcutaneous route for 10 days.  When finished with 10 day course of lovenox transition to Daily aspirin   fluticasone 110 MCG/ACT inhaler Commonly  known as: FLOVENT HFA Flovent HFA 110 mcg/actuation aerosol inhaler   fluticasone 50 MCG/ACT nasal spray Commonly known as: FLONASE Place 1 spray into both nostrils daily as needed for allergies or rhinitis.   gabapentin 300 MG capsule Commonly known as: NEURONTIN Take 300 mg by mouth at bedtime.   gabapentin 300 MG capsule Commonly known as: NEURONTIN gabapentin 300 mg capsule  Take 1 capsule every day by oral route at bedtime for 15 days.   Insulin Lispro Prot & Lispro (75-25) 100 UNIT/ML Kwikpen Commonly known as: HumaLOG Mix 75/25 KwikPen INJECT 18 UNITS IN THE MORNING AND 16 UNITS IN EVENING   meloxicam 15 MG tablet Commonly known as: MOBIC meloxicam 15 mg tablet  TAKE 1 TABLET EVERY DAY BY ORAL ROUTE FOR 30 DAYS.   metFORMIN 500 MG 24 hr tablet Commonly known as: GLUCOPHAGE-XR Take 2 tablets (1,000 mg  total) by mouth daily.   methocarbamol 500 MG tablet Commonly known as: ROBAXIN methocarbamol 500 mg tablet   NovoLOG Mix 70/30 FlexPen (70-30) 100 UNIT/ML FlexPen Generic drug: insulin aspart protamine - aspart Novolog Mix 70-30 FlexPen U-100 Insulin 100 unit/mL subcutaneous pen  INJECT 18 UNITS IN THE MORNING AND 16 UNITS IN EVENING   ondansetron 8 MG tablet Commonly known as: ZOFRAN ondansetron HCl 8 mg tablet   ondansetron 4 MG tablet Commonly known as: Zofran Take 1 tablet (4 mg total) by mouth every 8 (eight) hours as needed for nausea or vomiting.   oxyCODONE-acetaminophen 10-325 MG tablet Commonly known as: PERCOCET oxycodone-acetaminophen 10 mg-325 mg tablet  TAKE 1 TABLET BY MOUTH THREE TIMES A DAY AS NEEDED   Ozempic (1 MG/DOSE) 4 MG/3ML Sopn Generic drug: Semaglutide (1 MG/DOSE) INJECT 1 MG(0.75ML) UNDER THE SKIN ONE DAY A WEEK   ramipril 10 MG capsule Commonly known as: ALTACE Take 10 mg by mouth daily.   topiramate 50 MG tablet Commonly known as: TOPAMAX Take 25 mg by mouth at bedtime.   topiramate 25 MG tablet Commonly known as:  TOPAMAX topiramate 25 mg tablet  TAKE 1 TABLET BY MOUTH EVERY DAY AT NIGHT   traMADol 50 MG tablet Commonly known as: ULTRAM tramadol 50 mg tablet  TAKE 1 TABLET TWICE A DAY BY ORAL ROUTE AS NEEDED FOR 20 DAYS.        Allergies:  Allergies  Allergen Reactions   Liraglutide Other (See Comments)    Other reaction(s): Pancreatitis  *Victoza     Past Medical History:  Diagnosis Date   Anemia    Asthma    Diabetes mellitus    Family history of adverse reaction to anesthesia    Brother   Heart murmur    Hyperlipidemia    Hypertension    Pancreatitis    Pneumonia     Past Surgical History:  Procedure Laterality Date   ABDOMINAL HYSTERECTOMY     ANTERIOR LAT LUMBAR FUSION N/A 07/07/2019   Procedure: ANTERIOR LATERAL LUMBAR FUSION (XLIF) LUMBAR TWO THROUGH LUMBAR THREE, POSTERIOR SPINAL FUSION INTERBODY LUMBAR TWO THROUGH LUMBAR THREE;  Surgeon: Melina Schools, MD;  Location: [facility];  Service: Orthopedics;  Laterality: N/A;  4 HRS   CESAREAN SECTION     CHOLECYSTECTOMY     GANGLION CYST EXCISION     HIP CAPSULECTOMY Right 10/14/2018   Procedure: RIGHT HIP HETEROTOPIC OSSIFICATION RESECTION;  Surgeon: Shona Needles, MD;  Location: [facility];  Service: Orthopedics;  Laterality: Right;   LAPAROSCOPY     LUMBAR FUSION      Family History  Problem Relation Age of Onset   Diabetes Mother    Hypertension Mother    Glaucoma Brother    Diabetes Maternal Aunt    Diabetes Paternal Aunt    Diabetes Maternal Grandmother    Heart disease Neg Hx     Social History:  reports that she quit smoking about 7 years ago. Her smoking use included cigarettes. She has never used smokeless tobacco. She reports current alcohol use. She reports that she does not use drugs.    Review of Systems    Lipid history: on Lipitor 10 mg She is taking it irregularly with following results She says she forgets to take this regularly  Lab Results  Component Value Date   CHOL 185 11/21/2020    CHOL 153 11/29/2019   CHOL 128 03/22/2019   Lab Results  Component Value Date   HDL 50.70  11/21/2020   HDL 54.30 11/29/2019   HDL 48.10 03/22/2019   Lab Results  Component Value Date   LDLCALC 113 (H) 11/21/2020   LDLCALC 82 11/29/2019   LDLCALC 64 03/22/2019   Lab Results  Component Value Date   TRIG 105.0 11/21/2020   TRIG 81.0 11/29/2019   TRIG 80.0 03/22/2019   Lab Results  Component Value Date   CHOLHDL 4 11/21/2020   CHOLHDL 3 11/29/2019   CHOLHDL 3 03/22/2019   Lab Results  Component Value Date   LDLDIRECT 75.0 03/22/2018   LDLDIRECT 101.0 08/27/2015            Hypertension:  currently on 33m amlodipine and 10 mg ramipril   Hypertension is followed by PCP   BP Readings from Last 3 Encounters:  11/22/20 122/78  07/19/20 125/72  07/16/20 (!) 152/81   No recent changes in potassium or renal function:   Lab Results  Component Value Date   K 4.2 11/21/2020     Most recent foot exam: March 2021   Review of Systems    Physical Examination:  BP 122/78   Pulse 95   Ht 5' 2"  (1.575 m)   Wt 182 lb 6.4 oz (82.7 kg)   LMP 07/09/2011   SpO2 99%   BMI 33.36 kg/m    ASSESSMENT:  Diabetes type 2, insulin-dependent  See history of present illness for detailed discussion of current diabetes management, blood sugar patterns and problems identified  She is on Ozempic, premixed insulin twice a day, Farxiga and Metformin  Her A1c is about the same at 6.8 although slightly higher  Since she is on premixed insulin her blood sugars tend to fluctuate  However her data from her evening blood sugars are is not available  Also not checking after meals Generally is doing fairly well with diet Not able to exercise much because of various issues No hypoglycemia  HYPERLIPIDEMIA: Has been irregular on Lipitor and LDL is relatively higher now  HYPERTENSION: Blood pressure is now consistently improved    PLAN:    Currently do not need to change  insulin  Only if she has low normal or low sugars late night or early morning she can reduce evening insulin to 14 units However discussed postprandial blood sugar targets and needs to check after breakfast and lunch at least on her off days  Increase walking as tolerated Continue same dose of Ozempic, consider increasing the dose on the next visit when there is better availability of 2 mg  She was advised to take her Lipitor consistently every day either morning or evening  Follow-up in 4 months   There are no Patient Instructions on file for this visit.     AElayne Snare11/10/2020, 8:56 AM   Note: This office note was prepared with Dragon voice recognition system technology. Any transcriptional errors that result from this process are unintentional.

## 2020-12-11 ENCOUNTER — Other Ambulatory Visit: Payer: Self-pay | Admitting: Endocrinology

## 2020-12-11 DIAGNOSIS — E1165 Type 2 diabetes mellitus with hyperglycemia: Secondary | ICD-10-CM

## 2020-12-11 DIAGNOSIS — Z794 Long term (current) use of insulin: Secondary | ICD-10-CM

## 2021-02-05 ENCOUNTER — Other Ambulatory Visit: Payer: Self-pay | Admitting: Endocrinology

## 2021-02-06 DIAGNOSIS — J309 Allergic rhinitis, unspecified: Secondary | ICD-10-CM | POA: Diagnosis not present

## 2021-02-06 DIAGNOSIS — E2839 Other primary ovarian failure: Secondary | ICD-10-CM | POA: Diagnosis not present

## 2021-02-06 DIAGNOSIS — Z0000 Encounter for general adult medical examination without abnormal findings: Secondary | ICD-10-CM | POA: Diagnosis not present

## 2021-02-06 DIAGNOSIS — I10 Essential (primary) hypertension: Secondary | ICD-10-CM | POA: Diagnosis not present

## 2021-02-06 DIAGNOSIS — E782 Mixed hyperlipidemia: Secondary | ICD-10-CM | POA: Diagnosis not present

## 2021-02-11 ENCOUNTER — Other Ambulatory Visit: Payer: Self-pay | Admitting: Family Medicine

## 2021-02-11 DIAGNOSIS — E2839 Other primary ovarian failure: Secondary | ICD-10-CM

## 2021-02-26 ENCOUNTER — Other Ambulatory Visit: Payer: Self-pay | Admitting: Family Medicine

## 2021-02-26 DIAGNOSIS — Z1231 Encounter for screening mammogram for malignant neoplasm of breast: Secondary | ICD-10-CM

## 2021-03-26 ENCOUNTER — Other Ambulatory Visit (INDEPENDENT_AMBULATORY_CARE_PROVIDER_SITE_OTHER): Payer: BC Managed Care – PPO

## 2021-03-26 ENCOUNTER — Other Ambulatory Visit: Payer: Self-pay

## 2021-03-26 DIAGNOSIS — E1165 Type 2 diabetes mellitus with hyperglycemia: Secondary | ICD-10-CM | POA: Diagnosis not present

## 2021-03-26 DIAGNOSIS — E7800 Pure hypercholesterolemia, unspecified: Secondary | ICD-10-CM | POA: Diagnosis not present

## 2021-03-26 DIAGNOSIS — Z794 Long term (current) use of insulin: Secondary | ICD-10-CM | POA: Diagnosis not present

## 2021-03-26 LAB — COMPREHENSIVE METABOLIC PANEL
ALT: 11 U/L (ref 0–35)
AST: 15 U/L (ref 0–37)
Albumin: 4.3 g/dL (ref 3.5–5.2)
Alkaline Phosphatase: 82 U/L (ref 39–117)
BUN: 20 mg/dL (ref 6–23)
CO2: 26 mEq/L (ref 19–32)
Calcium: 9.6 mg/dL (ref 8.4–10.5)
Chloride: 104 mEq/L (ref 96–112)
Creatinine, Ser: 0.78 mg/dL (ref 0.40–1.20)
GFR: 85.41 mL/min (ref >60.00)
Glucose, Bld: 153 mg/dL — ABNORMAL HIGH (ref 70–99)
Potassium: 4.2 mEq/L (ref 3.5–5.1)
Sodium: 139 mEq/L (ref 135–145)
Total Bilirubin: 0.6 mg/dL (ref 0.2–1.2)
Total Protein: 6.8 g/dL (ref 6.0–8.3)

## 2021-03-26 LAB — LIPID PANEL
Cholesterol: 163 mg/dL (ref 0–200)
HDL: 51.2 mg/dL (ref >39.00)
LDL Cholesterol: 85 mg/dL (ref 0–99)
NonHDL: 111.61
Total CHOL/HDL Ratio: 3
Triglycerides: 134 mg/dL (ref 0.0–149.0)
VLDL: 26.8 mg/dL (ref 0.0–40.0)

## 2021-03-26 LAB — MICROALBUMIN / CREATININE URINE RATIO
Creatinine,U: 79.8 mg/dL
Microalb Creat Ratio: 0.9 mg/g (ref 0.0–30.0)
Microalb, Ur: <0.7 mg/dL (ref 0.0–1.9)

## 2021-03-26 LAB — HEMOGLOBIN A1C: Hgb A1c MFr Bld: 7.1 % — ABNORMAL HIGH (ref 4.6–6.5)

## 2021-03-28 ENCOUNTER — Other Ambulatory Visit: Payer: Self-pay

## 2021-03-28 ENCOUNTER — Encounter: Payer: Self-pay | Admitting: Endocrinology

## 2021-03-28 ENCOUNTER — Ambulatory Visit: Payer: BC Managed Care – PPO | Admitting: Endocrinology

## 2021-03-28 VITALS — BP 122/74 | HR 92 | Ht 62.0 in | Wt 172.0 lb

## 2021-03-28 DIAGNOSIS — E1165 Type 2 diabetes mellitus with hyperglycemia: Secondary | ICD-10-CM | POA: Diagnosis not present

## 2021-03-28 DIAGNOSIS — E7800 Pure hypercholesterolemia, unspecified: Secondary | ICD-10-CM | POA: Diagnosis not present

## 2021-03-28 DIAGNOSIS — Z794 Long term (current) use of insulin: Secondary | ICD-10-CM | POA: Diagnosis not present

## 2021-03-28 MED ORDER — FREESTYLE LIBRE 3 SENSOR MISC: 1.0000 | 2 refills | Status: DC

## 2021-03-28 NOTE — Patient Instructions (Signed)
With sensor use Libre view 3 app. ?

## 2021-03-28 NOTE — Progress Notes (Signed)
Patient ID: Veronica Fletcher, female   DOB: 05-Aug-1965, 56 y.o.   MRN: [id number] ? ?       ? ? ?Reason for Appointment: Follow-up for Type 2 Diabetes ? ?Referring physician: Harrington Challenger ? ?History of Present Illness:  ?        ?Date of diagnosis of type 2 diabetes mellitus: 2005      ? ?Background history:  ?She had been started on metformin initially and not clear what other treatment she had taken subsequently, records are being awaited today ?She apparently was doing very well with a combination of Victoza and metformin until 2015 and she thinks she was losing weight with this.  She had taken Victoza for several months at least ?However according to his lab records her best A1c in 2014 was only 7.9 ?Apparently she was taken off Victoza when she was admitted for abdominal pain presumed to be from pancreatitis in 01/2013 ?She was then started on premixed insulin and taken off metformin also on discharge. ?She was referred here for poorly controlled diabetes with A1c consistently around 10% ?On her initial consultation she was given metformin and Jardiance in addition to her insulin  ?V-go pump 20 unit basal and boluses 4 units previously stopped ? ?Recent history:  ? ?INSULIN regimen is: Humalog Mix at meals: 18 a.m.-16 before dinner ?  ?Non-insulin hypoglycemic drugs: Farxiga 10 mg daily, metformin ER 1000 mg daily Ozempic 1.0 mg weekly ? ?Her A1c is slightly higher at 7.1 ? ?Current management, blood sugar patterns and problems identified: ?Unable to download her monitor today ?Her fasting readings are more variable ?She said that because having her dental issue and pain she would sometimes forget to take her evening insulin  ?Again checking blood sugars somewhat sporadically and mostly in the mornings  ?Overall does not appear to have consistently high readings later in the day  ?With continuing [place] and recently eating differently her weight has gone down  ?She is trying to do a little walking also recently ?No recent  hypoglycemia ?No side effects with Ozempic or Farxiga and renal function was within normal ? ? ?Side effects from medications have been:?  Victoza caused pancreatitis ? ?Compliance with the medical regimen: Good ? ? ?Glucose monitoring:  done 0.5 times a day         Glucometer:  Accu-Chek guide ?  ?Blood Glucose readings from download as below ? ? ?PRE-MEAL Fasting Lunch Dinner Bedtime Overall  ?Glucose range: 129-180 151 131 126   ?Mean/median:     140  ? ?Prior ? ?PRE-MEAL Fasting Lunch Dinner Bedtime Overall  ?Glucose range: 97-176    96-251  ?Mean/median: 465 681  275 170  ? ?POST-MEAL PC Breakfast PC Lunch PC Dinner  ?Glucose range:   ?  ?Mean/median:     ? ? ?:Self-care: The diet that the patient has been following is: tries to limit portions.  ?    ?Typical meal intake: Breakfast is oatmeal/yogurt, lunch is a grilled chicken, evening meal is meat and 2 vegetables, will have snacks on yogurt    ?Dinner at 8 pm ?          ?Dietician visit, most recent:2014 ?              ? ?Weight history: ?  ?Wt Readings from Last 3 Encounters:  ?03/28/21 172 lb (78 kg)  ?11/22/20 182 lb 6.4 oz (82.7 kg)  ?07/19/20 181 lb 8 oz (82.3 kg)  ? ? ?Glycemic control: ?  ?  Lab Results  ?Component Value Date  ? HGBA1C 7.1 (H) 03/26/2021  ? HGBA1C 6.8 (H) 11/21/2020  ? HGBA1C 6.3 07/18/2020  ? ?Lab Results  ?Component Value Date  ? MICROALBUR <0.7 03/26/2021  ? [place] 85 03/26/2021  ? CREATININE 0.78 03/26/2021  ? ?Lab Results  ?Component Value Date  ? MICRALBCREAT 0.9 03/26/2021  ? ? ?Lab Results  ?Component Value Date  ? FRUCTOSAMINE 293 (H) 03/22/2018  ? FRUCTOSAMINE 250 08/12/2017  ? FRUCTOSAMINE 290 (H) 06/16/2017  ? FRUCTOSAMINE 284 10/22/2016  ? ? ? ?Lab on 03/26/2021  ?Component Date Value Ref Range Status  ? Cholesterol 03/26/2021 163  0 - 200 mg/dL Final  ? ATP III Classification       Desirable:  < 200 mg/dL               Borderline High:  200 - 239 mg/dL          High:  > = 240 mg/dL  ? Triglycerides 03/26/2021 134.0  0.0 -  149.0 mg/dL Final  ? Normal:  <150 mg/dLBorderline High:  150 - 199 mg/dL  ? HDL 03/26/2021 51.20  >39.00 mg/dL Final  ? VLDL 03/26/2021 26.8  0.0 - 40.0 mg/dL Final  ? LDL Cholesterol 03/26/2021 85  0 - 99 mg/dL Final  ? Total CHOL/HDL Ratio 03/26/2021 3   Final  ?                Men          Women1/2 Average Risk     3.4          3.3Average Risk          5.0          4.42X Average Risk          9.6          7.13X Average Risk          15.0          11.0                      ? NonHDL 03/26/2021 111.61   Final  ? NOTE:  Non-HDL goal should be 30 mg/dL higher than patient's LDL goal (i.e. LDL goal of < 70 mg/dL, would have non-HDL goal of < 100 mg/dL)  ? Sodium 03/26/2021 139  135 - 145 mEq/L Final  ? Potassium 03/26/2021 4.2  3.5 - 5.1 mEq/L Final  ? Chloride 03/26/2021 104  96 - 112 mEq/L Final  ? CO2 03/26/2021 26  19 - 32 mEq/L Final  ? Glucose, Bld 03/26/2021 153 (H)  70 - 99 mg/dL Final  ? BUN 03/26/2021 20  6 - 23 mg/dL Final  ? Creatinine, Ser 03/26/2021 0.78  0.40 - 1.20 mg/dL Final  ? Total Bilirubin 03/26/2021 0.6  0.2 - 1.2 mg/dL Final  ? Alkaline Phosphatase 03/26/2021 82  39 - 117 U/L Final  ? AST 03/26/2021 15  0 - 37 U/L Final  ? ALT 03/26/2021 11  0 - 35 U/L Final  ? Total Protein 03/26/2021 6.8  6.0 - 8.3 g/dL Final  ? Albumin 03/26/2021 4.3  3.5 - 5.2 g/dL Final  ? GFR 03/26/2021 85.41  >60.00 mL/min Final  ? Calculated using the CKD-EPI Creatinine Equation (2021)  ? Calcium 03/26/2021 9.6  8.4 - 10.5 mg/dL Final  ? Hgb A1c MFr Bld 03/26/2021 7.1 (H)  4.6 - 6.5 % Final  ? Glycemic Control Guidelines for  People with Diabetes:Non Diabetic:  <6%Goal of Therapy: <7%Additional Action Suggested:  >8%   ? Microalb, Ur 03/26/2021 <0.7  0.0 - 1.9 mg/dL Final  ? Creatinine,U 03/26/2021 79.8  mg/dL Final  ? Microalb Creat Ratio 03/26/2021 0.9  0.0 - 30.0 mg/g Final  ? ? ? ? ?Allergies as of 03/28/2021   ? ?   Reactions  ? Liraglutide Other (See Comments)  ? Other reaction(s): Pancreatitis ?*Victoza   ? ?  ? ?   ?Medication List  ?  ? ?  ? Accurate as of March 28, 2021  9:17 PM. If you have any questions, ask your nurse or doctor.  ?  ?  ? ?  ? ?STOP taking these medications   ? ?enoxaparin 40 MG/0.4ML injection ?Commonly known as: LOVENOX ?Stopped by: Elayne Snare, MD ?  ?oxyCODONE-acetaminophen 10-325 MG tablet ?Commonly known as: PERCOCET ?Stopped by: Elayne Snare, MD ?  ? ?  ? ?TAKE these medications   ? ?Accu-Chek FastClix Lancets Misc ?Use Accu Chek Fastclix lancets to check blood sugar twice daily. ?  ?Accu-Chek Guide test strip ?Generic drug: glucose blood ?Use to check blood sugar twice daily. ?  ?Accu-Chek Guide w/Device Kit ?1 each by Does not apply route 2 (two) times a day. Use as instructed to check blood sugar twice daily. ?  ?albuterol 108 (90 Base) MCG/ACT inhaler ?Commonly known as: VENTOLIN HFA ?Inhale 1-2 puffs into the lungs every 4 (four) hours as needed for wheezing or shortness of breath. ?  ?amLODipine 5 MG tablet ?Commonly known as: NORVASC ?Take 5 mg by mouth daily. ?  ?aspirin EC 81 MG tablet ?Take 81 mg by mouth daily. Swallow whole. ?  ?atorvastatin 10 MG tablet ?Commonly known as: LIPITOR ?Take 1 tablet (10 mg total) by mouth daily at 6 PM. ?  ?B-D UF III MINI PEN NEEDLES 31G X 5 MM Misc ?Generic drug: Insulin Pen Needle ?1 EACH BY DOES NOT APPLY ROUTE 2 (TWO) TIMES DAILY. USE TO INJECT INSULIN TWICE DAILY. ?  ?Biofreeze Roll-On 4 % Gel ?Generic drug: Menthol (Topical Analgesic) ?Biofreeze (menthol) 4 % topical gel ? Apply 1 application 4 times a day by topical route as needed for 30 days. ?  ?celecoxib 200 MG capsule ?Commonly known as: CELEBREX ?celecoxib 200 mg capsule ? TAKE 1 CAPSULE EVERY DAY BY ORAL ROUTE IN THE MORNING FOR 30 DAYS. ?  ?dapagliflozin propanediol 10 MG Tabs tablet ?Commonly known as: [place] ?Take 1 tablet (10 mg total) by mouth daily before breakfast. ?  ?fluticasone 110 MCG/ACT inhaler ?Commonly known as: FLOVENT HFA ?Flovent HFA 110 mcg/actuation aerosol inhaler ?   ?fluticasone 50 MCG/ACT nasal spray ?Commonly known as: FLONASE ?Place 1 spray into both nostrils daily as needed for allergies or rhinitis. ?  ?FreeStyle Libre 3 Sensor Misc ?1 Device by Does not apply r

## 2021-04-02 DIAGNOSIS — H43812 Vitreous degeneration, left eye: Secondary | ICD-10-CM | POA: Diagnosis not present

## 2021-04-02 DIAGNOSIS — E119 Type 2 diabetes mellitus without complications: Secondary | ICD-10-CM | POA: Diagnosis not present

## 2021-04-02 DIAGNOSIS — H25813 Combined forms of age-related cataract, bilateral: Secondary | ICD-10-CM | POA: Diagnosis not present

## 2021-04-02 DIAGNOSIS — H40053 Ocular hypertension, bilateral: Secondary | ICD-10-CM | POA: Diagnosis not present

## 2021-04-04 ENCOUNTER — Encounter: Payer: Self-pay | Admitting: Endocrinology

## 2021-04-04 DIAGNOSIS — E7800 Pure hypercholesterolemia, unspecified: Secondary | ICD-10-CM

## 2021-04-04 DIAGNOSIS — E1165 Type 2 diabetes mellitus with hyperglycemia: Secondary | ICD-10-CM

## 2021-04-08 ENCOUNTER — Other Ambulatory Visit: Payer: Self-pay | Admitting: Endocrinology

## 2021-04-08 DIAGNOSIS — E1165 Type 2 diabetes mellitus with hyperglycemia: Secondary | ICD-10-CM

## 2021-04-08 MED ORDER — METFORMIN HCL ER 500 MG PO TB24: 1000.0000 mg | ORAL_TABLET | Freq: Every day | ORAL | 3 refills | Status: DC

## 2021-04-08 MED ORDER — HUMALOG MIX 75/25 (75-25) 100 UNIT/ML ~~LOC~~ SUSP: 18.0000 [IU] | Freq: Two times a day (BID) | SUBCUTANEOUS | 1 refills | Status: DC

## 2021-04-08 MED ORDER — ATORVASTATIN CALCIUM 10 MG PO TABS: 10.0000 mg | ORAL_TABLET | Freq: Every day | ORAL | 3 refills | Status: DC

## 2021-04-08 MED ORDER — DAPAGLIFLOZIN PROPANEDIOL 10 MG PO TABS: 10.0000 mg | ORAL_TABLET | Freq: Every day | ORAL | 3 refills | Status: DC

## 2021-04-08 MED ORDER — FREESTYLE LIBRE 3 SENSOR MISC: 1.0000 | 2 refills | Status: DC

## 2021-04-08 MED ORDER — OZEMPIC (1 MG/DOSE) 4 MG/3ML ~~LOC~~ SOPN: PEN_INJECTOR | SUBCUTANEOUS | 2 refills | Status: DC

## 2021-04-23 DIAGNOSIS — R112 Nausea with vomiting, unspecified: Secondary | ICD-10-CM | POA: Diagnosis not present

## 2021-04-23 DIAGNOSIS — Z03818 Encounter for observation for suspected exposure to other biological agents ruled out: Secondary | ICD-10-CM | POA: Diagnosis not present

## 2021-04-23 DIAGNOSIS — B349 Viral infection, unspecified: Secondary | ICD-10-CM | POA: Diagnosis not present

## 2021-04-23 DIAGNOSIS — G4489 Other headache syndrome: Secondary | ICD-10-CM | POA: Diagnosis not present

## 2021-06-04 ENCOUNTER — Encounter: Payer: Self-pay | Admitting: Endocrinology

## 2021-06-13 ENCOUNTER — Telehealth: Payer: Self-pay | Admitting: Pharmacy Technician

## 2021-06-13 ENCOUNTER — Telehealth: Payer: Self-pay | Admitting: Endocrinology

## 2021-06-13 ENCOUNTER — Other Ambulatory Visit (HOSPITAL_COMMUNITY): Payer: Self-pay

## 2021-06-13 MED ORDER — NOVOLOG MIX 70/30 FLEXPEN (70-30) 100 UNIT/ML ~~LOC~~ SUPN: PEN_INJECTOR | SUBCUTANEOUS | 0 refills | Status: DC

## 2021-06-13 NOTE — Telephone Encounter (Signed)
Sent in Novolog 70/[facility] to [facility] as you and I discussed.

## 2021-06-13 NOTE — Telephone Encounter (Signed)
Patient Advocate Encounter   Received notification from patient/CMA that prior authorization for Humalog 75/25 is required by his/her insurance [employer].  Per Test Claim: Novolog Mix is preferred or Medical Necessity PA

## 2021-06-13 NOTE — Telephone Encounter (Signed)
Patient called to advise that she is going to run out of her Humalog 75/25 before the prior authorization process is completed. Patient requesting a gap Rx to be sent to a local [facility] on [address].  Call back # [phone]

## 2021-06-13 NOTE — Telephone Encounter (Signed)
Novolog sent to local pharmacy as patient requested.

## 2021-06-29 ENCOUNTER — Other Ambulatory Visit: Payer: Self-pay | Admitting: Endocrinology

## 2021-06-29 DIAGNOSIS — E1165 Type 2 diabetes mellitus with hyperglycemia: Secondary | ICD-10-CM

## 2021-07-02 ENCOUNTER — Telehealth: Payer: Self-pay

## 2021-07-02 ENCOUNTER — Other Ambulatory Visit (INDEPENDENT_AMBULATORY_CARE_PROVIDER_SITE_OTHER): Payer: BC Managed Care – PPO

## 2021-07-02 DIAGNOSIS — E1165 Type 2 diabetes mellitus with hyperglycemia: Secondary | ICD-10-CM | POA: Diagnosis not present

## 2021-07-02 DIAGNOSIS — Z794 Long term (current) use of insulin: Secondary | ICD-10-CM

## 2021-07-02 LAB — BASIC METABOLIC PANEL
BUN: 17 mg/dL (ref 6–23)
CO2: 26 mEq/L (ref 19–32)
Calcium: 9.1 mg/dL (ref 8.4–10.5)
Chloride: 104 mEq/L (ref 96–112)
Creatinine, Ser: 0.85 mg/dL (ref 0.40–1.20)
GFR: 76.9 mL/min (ref >60.00)
Glucose, Bld: 61 mg/dL — ABNORMAL LOW (ref 70–99)
Potassium: 3.8 mEq/L (ref 3.5–5.1)
Sodium: 139 mEq/L (ref 135–145)

## 2021-07-02 LAB — HEMOGLOBIN A1C: Hgb A1c MFr Bld: 6.6 % — ABNORMAL HIGH (ref 4.6–6.5)

## 2021-07-02 NOTE — Telephone Encounter (Signed)
I received this back from pharmacy when sending in patient ozempic.   Pharmacy comment: Patient has indicated to [place] that they are either allergic or sensitive to Exenatide. There is possible cross-allergy. May we dispense? Please respond with appropriate changes or comment to Pharmacy.  Patient has indicated to [place] that they are either allergic or sensitive to Exenatide. There is possible cross-allergy. May we dispense? Please respond with appropriate changes or comment to Pharmacy.  Please advise

## 2021-07-03 ENCOUNTER — Encounter: Payer: Self-pay | Admitting: Endocrinology

## 2021-07-04 ENCOUNTER — Ambulatory Visit (INDEPENDENT_AMBULATORY_CARE_PROVIDER_SITE_OTHER): Payer: BC Managed Care – PPO | Admitting: Endocrinology

## 2021-07-04 ENCOUNTER — Encounter: Payer: Self-pay | Admitting: Endocrinology

## 2021-07-04 VITALS — BP 152/74 | HR 95 | Ht 62.0 in | Wt 175.2 lb

## 2021-07-04 DIAGNOSIS — E7800 Pure hypercholesterolemia, unspecified: Secondary | ICD-10-CM | POA: Diagnosis not present

## 2021-07-04 DIAGNOSIS — E1165 Type 2 diabetes mellitus with hyperglycemia: Secondary | ICD-10-CM

## 2021-07-04 DIAGNOSIS — Z794 Long term (current) use of insulin: Secondary | ICD-10-CM | POA: Diagnosis not present

## 2021-07-04 DIAGNOSIS — I10 Essential (primary) hypertension: Secondary | ICD-10-CM

## 2021-07-04 MED ORDER — FREESTYLE LIBRE 3 SENSOR MISC: 1.0000 | 2 refills | Status: DC

## 2021-07-04 NOTE — Telephone Encounter (Signed)
Rx resent.

## 2021-07-04 NOTE — Patient Instructions (Signed)
Check blood sugars on waking up   Also check blood sugars about 2 hours after meals and do this after different meals by rotation  Recommended blood sugar levels on waking up are 90-130 and about 2 hours after meal is 130-160  Please bring your blood sugar monitor to each visit, thank you  

## 2021-07-04 NOTE — Progress Notes (Unsigned)
Patient ID: Veronica Fletcher, female   DOB: 1965/10/27, 56 y.o.   MRN: [id number]           Reason for Appointment: Follow-up for Type 2 Diabetes  Referring physician: Harrington Challenger  History of Present Illness:          Date of diagnosis of type 2 diabetes mellitus: 2005       Background history:  She had been started on metformin initially and not clear what other treatment she had taken subsequently, records are being awaited today She apparently was doing very well with a combination of Victoza and metformin until 2015 and she thinks she was losing weight with this.  She had taken Victoza for several months at least However according to his lab records her best A1c in 2014 was only 7.9 Apparently she was taken off Victoza when she was admitted for abdominal pain presumed to be from pancreatitis in 01/2013 She was then started on premixed insulin and taken off metformin also on discharge. She was referred here for poorly controlled diabetes with A1c consistently around 10% On her initial consultation she was given metformin and Jardiance in addition to her insulin  V-go pump 20 unit basal and boluses 4 units previously stopped  Recent history:   INSULIN regimen is: Humalog Mix at meals: 18 a.m.-16 before dinner   Non-insulin hypoglycemic drugs: Farxiga 10 mg daily, metformin ER 1000 mg daily Ozempic 1.0 mg weekly  Her A1c is slightly higher at 7.1  Current management, blood sugar patterns and problems identified: Unable to download her monitor today Her fasting readings are more variable She said that because having her dental issue and pain she would sometimes forget to take her evening insulin  Again checking blood sugars somewhat sporadically and mostly in the mornings  Overall does not appear to have consistently high readings later in the day  With continuing Ozempic and recently eating differently her weight has gone down  She is trying to do a little walking also recently No recent  hypoglycemia No side effects with Ozempic or Farxiga and renal function was within normal   Side effects from medications have been:?  Victoza caused pancreatitis  Compliance with the medical regimen: Good   Glucose monitoring:  done 0.5 times a day         Glucometer:  Accu-Chek guide   Blood Glucose readings from download as below   PRE-MEAL Fasting Lunch Dinner Bedtime Overall  Glucose range: 105-160      Mean/median:     134   POST-MEAL PC Breakfast PC Lunch PC Dinner  Glucose range:     Mean/median:      Prior   PRE-MEAL Fasting Lunch Dinner Bedtime Overall  Glucose range: 129-180 151 131 126   Mean/median:     140     :Self-care: The diet that the patient has been following is: tries to limit portions.      Typical meal intake: Breakfast is oatmeal/yogurt, lunch is a grilled chicken, evening meal is meat and 2 vegetables, will have snacks on yogurt    Dinner at 8 pm           Dietician visit, most recent:2014                Weight history:   Wt Readings from Last 3 Encounters:  07/04/21 175 lb 3.2 oz (79.5 kg)  03/28/21 172 lb (78 kg)  11/22/20 182 lb 6.4 oz (82.7 kg)    Glycemic control:  Lab Results  Component Value Date   HGBA1C 6.6 (H) 07/02/2021   HGBA1C 7.1 (H) 03/26/2021   HGBA1C 6.8 (H) 11/21/2020   Lab Results  Component Value Date   MICROALBUR <0.7 03/26/2021   LDLCALC 85 03/26/2021   CREATININE 0.85 07/02/2021   Lab Results  Component Value Date   MICRALBCREAT 0.9 03/26/2021    Lab Results  Component Value Date   FRUCTOSAMINE 293 (H) 03/22/2018   FRUCTOSAMINE 250 08/12/2017   FRUCTOSAMINE 290 (H) 06/16/2017   FRUCTOSAMINE 284 10/22/2016     Lab on 07/02/2021  Component Date Value Ref Range Status   Sodium 07/02/2021 139  135 - 145 mEq/L Final   Potassium 07/02/2021 3.8  3.5 - 5.1 mEq/L Final   Chloride 07/02/2021 104  96 - 112 mEq/L Final   CO2 07/02/2021 26  19 - 32 mEq/L Final   Glucose, Bld 07/02/2021 61 (L)  70 - 99  mg/dL Final   BUN 07/02/2021 17  6 - 23 mg/dL Final   Creatinine, Ser 07/02/2021 0.85  0.40 - 1.20 mg/dL Final   GFR 07/02/2021 76.90  >60.00 mL/min Final   Calculated using the CKD-EPI Creatinine Equation (2021)   Calcium 07/02/2021 9.1  8.4 - 10.5 mg/dL Final   Hgb A1c MFr Bld 07/02/2021 6.6 (H)  4.6 - 6.5 % Final   Glycemic Control Guidelines for People with Diabetes:Non Diabetic:  <6%Goal of Therapy: <7%Additional Action Suggested:  >8%       Allergies as of 07/04/2021       Reactions   Liraglutide Other (See Comments)   Other reaction(s): Pancreatitis *Victoza         Medication List        Accurate as of July 04, 2021  8:28 PM. If you have any questions, ask your nurse or doctor.          Accu-Chek FastClix Lancets Misc Use Accu Chek Fastclix lancets to check blood sugar twice daily.   Accu-Chek Guide test strip Generic drug: glucose blood Use to check blood sugar twice daily.   Accu-Chek Guide w/Device Kit 1 each by Does not apply route 2 (two) times a day. Use as instructed to check blood sugar twice daily.   albuterol 108 (90 Base) MCG/ACT inhaler Commonly known as: VENTOLIN HFA Inhale 1-2 puffs into the lungs every 4 (four) hours as needed for wheezing or shortness of breath.   amLODipine 5 MG tablet Commonly known as: NORVASC Take 5 mg by mouth daily.   aspirin EC 81 MG tablet Take 81 mg by mouth daily. Swallow whole.   atorvastatin 10 MG tablet Commonly known as: LIPITOR Take 1 tablet (10 mg total) by mouth daily at 6 PM.   B-D UF III MINI PEN NEEDLES 31G X 5 MM Misc Generic drug: Insulin Pen Needle 1 EACH BY DOES NOT APPLY ROUTE 2 (TWO) TIMES DAILY. USE TO INJECT INSULIN TWICE DAILY.   Biofreeze Roll-On 4 % Gel Generic drug: Menthol (Topical Analgesic) Biofreeze (menthol) 4 % topical gel  Apply 1 application 4 times a day by topical route as needed for 30 days.   celecoxib 200 MG capsule Commonly known as: CELEBREX celecoxib 200 mg  capsule  TAKE 1 CAPSULE EVERY DAY BY ORAL ROUTE IN THE MORNING FOR 30 DAYS.   dapagliflozin propanediol 10 MG Tabs tablet Commonly known as: Farxiga Take 1 tablet (10 mg total) by mouth daily before breakfast.   fluticasone 110 MCG/ACT inhaler Commonly known as: FLOVENT HFA Flovent  HFA 110 mcg/actuation aerosol inhaler   fluticasone 50 MCG/ACT nasal spray Commonly known as: FLONASE Place 1 spray into both nostrils daily as needed for allergies or rhinitis.   FreeStyle Libre 3 Sensor Misc 1 Device by Does not apply route every 14 (fourteen) days. Apply 1 sensor on upper arm every 14 days for continuous glucose monitoring   gabapentin 300 MG capsule Commonly known as: NEURONTIN Take 300 mg by mouth at bedtime.   HumaLOG Mix 75/25 (75-25) 100 UNIT/ML Susp injection Generic drug: insulin lispro protamine-lispro Inject 18 Units into the skin 2 (two) times daily with a meal.   ibuprofen 600 MG tablet Commonly known as: ADVIL Take 600 mg by mouth every 6 (six) hours.   meloxicam 15 MG tablet Commonly known as: MOBIC meloxicam 15 mg tablet  TAKE 1 TABLET EVERY DAY BY ORAL ROUTE FOR 30 DAYS.   metFORMIN 500 MG 24 hr tablet Commonly known as: GLUCOPHAGE-XR Take 2 tablets (1,000 mg total) by mouth daily.   methocarbamol 500 MG tablet Commonly known as: ROBAXIN methocarbamol 500 mg tablet   NovoLOG Mix 70/30 FlexPen (70-30) 100 UNIT/ML FlexPen Generic drug: insulin aspart protamine - aspart Inject 18 Units into the skin 2 (two) times daily with a meal.   ondansetron 8 MG tablet Commonly known as: ZOFRAN ondansetron HCl 8 mg tablet   ondansetron 4 MG tablet Commonly known as: Zofran Take 1 tablet (4 mg total) by mouth every 8 (eight) hours as needed for nausea or vomiting.   Ozempic (1 MG/DOSE) 4 MG/3ML Sopn Generic drug: Semaglutide (1 MG/DOSE) INJECT 1MG SUBCUTANEOUSLY  WEEKLY (EVERY 7 DAYS).   ramipril 10 MG capsule Commonly known as: ALTACE Take 10 mg by mouth  daily.   topiramate 50 MG tablet Commonly known as: TOPAMAX Take 25 mg by mouth at bedtime.   topiramate 25 MG tablet Commonly known as: TOPAMAX   traMADol 50 MG tablet Commonly known as: ULTRAM tramadol 50 mg tablet  TAKE 1 TABLET TWICE A DAY BY ORAL ROUTE AS NEEDED FOR 20 DAYS.        Allergies:  Allergies  Allergen Reactions   Liraglutide Other (See Comments)    Other reaction(s): Pancreatitis  *Victoza     Past Medical History:  Diagnosis Date   Anemia    Asthma    Diabetes mellitus    Family history of adverse reaction to anesthesia    Brother   Heart murmur    Hyperlipidemia    Hypertension    Pancreatitis    Pneumonia     Past Surgical History:  Procedure Laterality Date   ABDOMINAL HYSTERECTOMY     ANTERIOR LAT LUMBAR FUSION N/A 07/07/2019   Procedure: ANTERIOR LATERAL LUMBAR FUSION (XLIF) LUMBAR TWO THROUGH LUMBAR THREE, POSTERIOR SPINAL FUSION INTERBODY LUMBAR TWO THROUGH LUMBAR THREE;  Surgeon: Melina Schools, MD;  Location: [facility];  Service: Orthopedics;  Laterality: N/A;  4 HRS   CESAREAN SECTION     CHOLECYSTECTOMY     GANGLION CYST EXCISION     HIP CAPSULECTOMY Right 10/14/2018   Procedure: RIGHT HIP HETEROTOPIC OSSIFICATION RESECTION;  Surgeon: Shona Needles, MD;  Location: [facility];  Service: Orthopedics;  Laterality: Right;   LAPAROSCOPY     LUMBAR FUSION      Family History  Problem Relation Age of Onset   Diabetes Mother    Hypertension Mother    Glaucoma Brother    Diabetes Maternal Aunt    Diabetes Paternal Aunt    Diabetes  Maternal Grandmother    Heart disease Neg Hx     Social History:  reports that she quit smoking about 8 years ago. Her smoking use included cigarettes. She has never used smokeless tobacco. She reports current alcohol use. She reports that she does not use drugs.    Review of Systems    Lipid history: on Lipitor 10 mg She is taking it regularly with following results LDL is improved  Lab Results   Component Value Date   CHOL 163 03/26/2021   CHOL 185 11/21/2020   CHOL 153 11/29/2019   Lab Results  Component Value Date   HDL 51.20 03/26/2021   HDL 50.70 11/21/2020   HDL 54.30 11/29/2019   Lab Results  Component Value Date   LDLCALC 85 03/26/2021   LDLCALC 113 (H) 11/21/2020   LDLCALC 82 11/29/2019   Lab Results  Component Value Date   TRIG 134.0 03/26/2021   TRIG 105.0 11/21/2020   TRIG 81.0 11/29/2019   Lab Results  Component Value Date   CHOLHDL 3 03/26/2021   CHOLHDL 4 11/21/2020   CHOLHDL 3 11/29/2019   Lab Results  Component Value Date   LDLDIRECT 75.0 03/22/2018   LDLDIRECT 101.0 08/27/2015            Hypertension:  currently on 66m amlodipine and 10 mg ramipril   Hypertension is followed by PCP Home BP 130-140/80 No Rx today  BP Readings from Last 3 Encounters:  07/04/21 (!) 152/74  03/28/21 122/74  11/22/20 122/78   No recent changes in potassium or renal function:   Lab Results  Component Value Date   K 3.8 07/02/2021     Most recent foot exam: March 2021   Review of Systems    Physical Examination:  BP (!) 152/74   Pulse 95   Ht 5' 2" (1.575 m)   Wt 175 lb 3.2 oz (79.5 kg)   LMP 07/09/2011   SpO2 99%   BMI 32.04 kg/m   Diabetic Foot Exam - Simple   Simple Foot Form Diabetic Foot exam was performed with the following findings: Yes   Visual Inspection No deformities, no ulcerations, no other skin breakdown bilaterally: Yes Sensation Testing Intact to touch and monofilament testing bilaterally: Yes Pulse Check Posterior Tibialis and Dorsalis pulse intact bilaterally: Yes Comments     ASSESSMENT:  Diabetes type 2, insulin-dependent  See history of present illness for detailed discussion of current diabetes management, blood sugar patterns and problems identified  She is on Ozempic, premixed insulin twice a day, Farxiga and Metformin  Her A1c is about the same as before at 6.6  She is not monitoring her blood  sugars consistently and also occasionally forgetting her evening insulin Difficult to know what her blood sugar patterns are especially after dinner  Foot exam does not show any abnormality  Hypertension: Blood pressure is relatively high but may be related to whitecoat syndrome as it is better at home   PLAN:    We will again try to get her to start the freestyle libre sensor and she should be able to use her phone to download the app for the l[place] now Discussed advantages of libre 3 sensor We will send this to the local [facility] as it may be better covered at her local pharmacy instead of mail order Again reminded her about the use of the continuous glucose sensor, use of the smart phone app that is needed This will also help her with reminders regarding  missed insulin doses and help her with her diet She will let us know if she needs any help starting this  She will continue the same insulin dose for now  Increase physical activity as tolerated  To start monitoring blood pressure more regularly at home and notify if blood pressures are consistently high  Follow-up in 4 months   Patient Instructions  Check blood sugars on waking up   Also check blood sugars about 2 hours after meals and do this after different meals by rotation  Recommended blood sugar levels on waking up are 90-130 and about 2 hours after meal is 130-160  Please bring your blood sugar monitor to each visit, thank you      Elayne Snare 07/04/2021, 8:28 PM   Note: This office note was prepared with Dragon voice recognition system technology. Any transcriptional errors that result from this process are unintentional.

## 2021-07-19 ENCOUNTER — Other Ambulatory Visit: Payer: Self-pay | Admitting: Endocrinology

## 2021-07-19 DIAGNOSIS — E1165 Type 2 diabetes mellitus with hyperglycemia: Secondary | ICD-10-CM

## 2021-07-23 ENCOUNTER — Ambulatory Visit
Admission: RE | Admit: 2021-07-23 | Discharge: 2021-07-23 | Disposition: A | Discharge status: Home / Self Care | Payer: BC Managed Care – PPO | Source: Ambulatory Visit | Attending: Family Medicine | Admitting: Family Medicine

## 2021-07-23 DIAGNOSIS — E2839 Other primary ovarian failure: Secondary | ICD-10-CM

## 2021-07-23 DIAGNOSIS — Z1231 Encounter for screening mammogram for malignant neoplasm of breast: Secondary | ICD-10-CM

## 2021-07-23 DIAGNOSIS — Z780 Asymptomatic menopausal state: Secondary | ICD-10-CM | POA: Diagnosis not present

## 2021-08-21 DIAGNOSIS — K648 Other hemorrhoids: Secondary | ICD-10-CM | POA: Diagnosis not present

## 2021-08-21 DIAGNOSIS — Z8371 Family history of colonic polyps: Secondary | ICD-10-CM | POA: Diagnosis not present

## 2021-08-21 DIAGNOSIS — D123 Benign neoplasm of transverse colon: Secondary | ICD-10-CM | POA: Diagnosis not present

## 2021-08-21 DIAGNOSIS — Z1211 Encounter for screening for malignant neoplasm of colon: Secondary | ICD-10-CM | POA: Diagnosis not present

## 2021-10-01 DIAGNOSIS — H40053 Ocular hypertension, bilateral: Secondary | ICD-10-CM | POA: Diagnosis not present

## 2021-10-01 DIAGNOSIS — E119 Type 2 diabetes mellitus without complications: Secondary | ICD-10-CM | POA: Diagnosis not present

## 2021-10-01 DIAGNOSIS — H43812 Vitreous degeneration, left eye: Secondary | ICD-10-CM | POA: Diagnosis not present

## 2021-10-01 DIAGNOSIS — H25813 Combined forms of age-related cataract, bilateral: Secondary | ICD-10-CM | POA: Diagnosis not present

## 2021-11-05 ENCOUNTER — Other Ambulatory Visit (INDEPENDENT_AMBULATORY_CARE_PROVIDER_SITE_OTHER): Payer: BC Managed Care – PPO

## 2021-11-05 DIAGNOSIS — E1165 Type 2 diabetes mellitus with hyperglycemia: Secondary | ICD-10-CM

## 2021-11-05 DIAGNOSIS — E7800 Pure hypercholesterolemia, unspecified: Secondary | ICD-10-CM | POA: Diagnosis not present

## 2021-11-05 DIAGNOSIS — Z794 Long term (current) use of insulin: Secondary | ICD-10-CM

## 2021-11-05 LAB — LIPID PANEL
Cholesterol: 133 mg/dL (ref 0–200)
HDL: 54.2 mg/dL (ref >39.00)
LDL Cholesterol: 66 mg/dL (ref 0–99)
NonHDL: 79.12
Total CHOL/HDL Ratio: 2
Triglycerides: 65 mg/dL (ref 0.0–149.0)
VLDL: 13 mg/dL (ref 0.0–40.0)

## 2021-11-05 LAB — COMPREHENSIVE METABOLIC PANEL
ALT: 13 U/L (ref 0–35)
AST: 18 U/L (ref 0–37)
Albumin: 4.3 g/dL (ref 3.5–5.2)
Alkaline Phosphatase: 72 U/L (ref 39–117)
BUN: 16 mg/dL (ref 6–23)
CO2: 27 mEq/L (ref 19–32)
Calcium: 9.3 mg/dL (ref 8.4–10.5)
Chloride: 104 mEq/L (ref 96–112)
Creatinine, Ser: 0.71 mg/dL (ref 0.40–1.20)
GFR: 95.2 mL/min (ref >60.00)
Glucose, Bld: 116 mg/dL — ABNORMAL HIGH (ref 70–99)
Potassium: 3.7 mEq/L (ref 3.5–5.1)
Sodium: 138 mEq/L (ref 135–145)
Total Bilirubin: 0.6 mg/dL (ref 0.2–1.2)
Total Protein: 7.3 g/dL (ref 6.0–8.3)

## 2021-11-05 LAB — HEMOGLOBIN A1C: Hgb A1c MFr Bld: 6.6 % — ABNORMAL HIGH (ref 4.6–6.5)

## 2021-11-06 NOTE — Progress Notes (Signed)
Patient ID: Veronica Fletcher, female   DOB: 1965/05/10, 56 y.o.   MRN: [id number]           Reason for Appointment: Follow-up for Type 2 Diabetes  Referring physician: Harrington Challenger  History of Present Illness:          Date of diagnosis of type 2 diabetes mellitus: 2005       Background history:  She had been started on metformin initially and not clear what other treatment she had taken subsequently, records are being awaited today She apparently was doing very well with a combination of Victoza and metformin until 2015 and she thinks she was losing weight with this.  She had taken Victoza for several months at least However according to his lab records her best A1c in 2014 was only 7.9 Apparently she was taken off Victoza when she was admitted for abdominal pain presumed to be from pancreatitis in 01/2013 She was then started on premixed insulin and taken off metformin also on discharge. She was referred here for poorly controlled diabetes with A1c consistently around 10% On her initial consultation she was given metformin and Jardiance in addition to her insulin  V-go pump 20 unit basal and boluses 4 units previously stopped  Recent history:   INSULIN regimen is: Humalog Mix at meals: 18 a.m.-16 before dinner   Non-insulin hypoglycemic drugs: Farxiga 10 mg daily, metformin ER 1000 mg daily Ozempic 1.0 mg weekly  Her A1c is stable at 6.6  Current management, blood sugar patterns and problems identified: 10/23 Unable to download her monitor and only a few readings are available recently Even though [facility] was [facility] to [facility] for the [place] [facility] she was not able to get this for unknown reasons She thinks she is walking on several days up to 1 mile with her dog.  Thanks However weight has not come down more than 2 pounds She has been regular with her Ozempic and [place] Some of her high readings in the mornings are related to checking blood sugar about 20 minutes after eating She is eating  oatmeal packets which are likely sweetened Renal function normal    Side effects from medications have been:?  Victoza caused pancreatitis  Compliance with the medical regimen: Fair  Glucometer:  Accu-Chek guide   Blood Glucose readings from review of monitor   PRE-MEAL Fasting Lunch Dinner Bedtime Overall  Glucose range: 102 104     Mean/median:     137   POST-MEAL PC Breakfast PC Lunch PC Dinner  Glucose range: 151-175 160   Mean/median:      Previous data:   PRE-MEAL Fasting Lunch Dinner Bedtime Overall  Glucose range: 105-160      Mean/median:     134   POST-MEAL PC Breakfast PC Lunch PC Dinner  Glucose range:   ?  Mean/median:      :Self-care: The diet that the patient has been following is: tries to limit portions.      Typical meal intake: Breakfast is oatmeal/yogurt or egg/oatmeal, lunch is a grilled chicken, evening meal is meat and 2 vegetables, will have snacks on yogurt    Dinner at 8 pm           Dietician visit, most recent:2014                Weight history:   Wt Readings from Last 3 Encounters:  11/07/21 173 lb 12.8 oz (78.8 kg)  07/04/21 175 lb 3.2 oz (79.5 kg)  03/28/21  172 lb (78 kg)    Glycemic control:   Lab Results  Component Value Date   HGBA1C 6.6 (H) 11/05/2021   HGBA1C 6.6 (H) 07/02/2021   HGBA1C 7.1 (H) 03/26/2021   Lab Results  Component Value Date   MICROALBUR <0.7 03/26/2021   LDLCALC 66 11/05/2021   CREATININE 0.71 11/05/2021   Lab Results  Component Value Date   MICRALBCREAT 0.9 03/26/2021    Lab Results  Component Value Date   FRUCTOSAMINE 293 (H) 03/22/2018   FRUCTOSAMINE 250 08/12/2017   FRUCTOSAMINE 290 (H) 06/16/2017   FRUCTOSAMINE 284 10/22/2016     Lab on 11/05/2021  Component Date Value Ref Range Status   Cholesterol 11/05/2021 133  0 - 200 mg/dL Final   ATP III Classification       Desirable:  < 200 mg/dL               Borderline High:  200 - 239 mg/dL          High:  > = 240 mg/dL   Triglycerides  11/05/2021 65.0  0.0 - 149.0 mg/dL Final   Normal:  <150 mg/dLBorderline High:  150 - 199 mg/dL   HDL 11/05/2021 54.20  >39.00 mg/dL Final   VLDL 11/05/2021 13.0  0.0 - 40.0 mg/dL Final   LDL Cholesterol 11/05/2021 66  0 - 99 mg/dL Final   Total CHOL/HDL Ratio 11/05/2021 2   Final                  Men          Women1/2 Average Risk     3.4          3.3Average Risk          5.0          4.42X Average Risk          9.6          7.13X Average Risk          15.0          11.0                       NonHDL 11/05/2021 79.12   Final   NOTE:  Non-HDL goal should be 30 mg/dL higher than patient's LDL goal (i.e. LDL goal of < 70 mg/dL, would have non-HDL goal of < 100 mg/dL)   Sodium 11/05/2021 138  135 - 145 mEq/L Final   Potassium 11/05/2021 3.7  3.5 - 5.1 mEq/L Final   Chloride 11/05/2021 104  96 - 112 mEq/L Final   CO2 11/05/2021 27  19 - 32 mEq/L Final   Glucose, Bld 11/05/2021 116 (H)  70 - 99 mg/dL Final   BUN 11/05/2021 16  6 - 23 mg/dL Final   Creatinine, Ser 11/05/2021 0.71  0.40 - 1.20 mg/dL Final   Total Bilirubin 11/05/2021 0.6  0.2 - 1.2 mg/dL Final   Alkaline Phosphatase 11/05/2021 72  39 - 117 U/L Final   AST 11/05/2021 18  0 - 37 U/L Final   ALT 11/05/2021 13  0 - 35 U/L Final   Total Protein 11/05/2021 7.3  6.0 - 8.3 g/dL Final   Albumin 11/05/2021 4.3  3.5 - 5.2 g/dL Final   GFR 11/05/2021 95.20  >60.00 mL/min Final   Calculated using the CKD-EPI Creatinine Equation (2021)   Calcium 11/05/2021 9.3  8.4 - 10.5 mg/dL Final   Hgb A1c MFr Bld 11/05/2021 6.6 (H)  4.6 - 6.5 % Final   Glycemic Control Guidelines for People with Diabetes:Non Diabetic:  <6%Goal of Therapy: <7%Additional Action Suggested:  >8%       Allergies as of 11/07/2021       Reactions   Liraglutide Other (See Comments)   Other reaction(s): Pancreatitis *Victoza         Medication List        Accurate as of November 07, 2021  9:19 AM. If you have any questions, ask your nurse or doctor.           Accu-Chek FastClix Lancets Misc Use Accu Chek Fastclix lancets to check blood sugar twice daily.   Accu-Chek Guide test strip Generic drug: glucose blood Use to check blood sugar twice daily.   Accu-Chek Guide w/Device Kit 1 each by Does not apply route 2 (two) times a day. Use as instructed to check blood sugar twice daily.   albuterol 108 (90 Base) MCG/ACT inhaler Commonly known as: VENTOLIN HFA Inhale 1-2 puffs into the lungs every 4 (four) hours as needed for wheezing or shortness of breath.   amLODipine 5 MG tablet Commonly known as: NORVASC Take 5 mg by mouth daily.   aspirin EC 81 MG tablet Take 81 mg by mouth daily. Swallow whole.   atorvastatin 10 MG tablet Commonly known as: LIPITOR Take 1 tablet (10 mg total) by mouth daily at 6 PM.   B-D UF III MINI PEN NEEDLES 31G X 5 MM Misc Generic drug: Insulin Pen Needle 1 EACH BY DOES NOT APPLY ROUTE 2 (TWO) TIMES DAILY. USE TO INJECT INSULIN TWICE DAILY.   Biofreeze Roll-On 4 % Gel Generic drug: Menthol (Topical Analgesic) Biofreeze (menthol) 4 % topical gel  Apply 1 application 4 times a day by topical route as needed for 30 days.   celecoxib 200 MG capsule Commonly known as: CELEBREX celecoxib 200 mg capsule  TAKE 1 CAPSULE EVERY DAY BY ORAL ROUTE IN THE MORNING FOR 30 DAYS.   Farxiga 10 MG Tabs tablet Generic drug: dapagliflozin propanediol TAKE 1 TABLET BY MOUTH EVERY DAY BEFORE BREAKFAST   fluticasone 110 MCG/ACT inhaler Commonly known as: FLOVENT HFA Flovent HFA 110 mcg/actuation aerosol inhaler   fluticasone 50 MCG/ACT nasal spray Commonly known as: FLONASE Place 1 spray into both nostrils daily as needed for allergies or rhinitis.   FreeStyle Libre 3 Sensor Misc 1 Device by Does not apply route every 14 (fourteen) days. Apply 1 sensor on upper arm every 14 days for continuous glucose monitoring   gabapentin 300 MG capsule Commonly known as: NEURONTIN Take 300 mg by mouth at bedtime.   HumaLOG  Mix 75/25 (75-25) 100 UNIT/ML Susp injection Generic drug: insulin lispro protamine-lispro Inject 18 Units into the skin 2 (two) times daily with a meal. What changed: additional instructions   ibuprofen 600 MG tablet Commonly known as: ADVIL Take 600 mg by mouth every 6 (six) hours.   meloxicam 15 MG tablet Commonly known as: MOBIC meloxicam 15 mg tablet  TAKE 1 TABLET EVERY DAY BY ORAL ROUTE FOR 30 DAYS.   metFORMIN 500 MG 24 hr tablet Commonly known as: GLUCOPHAGE-XR Take 2 tablets (1,000 mg total) by mouth daily.   methocarbamol 500 MG tablet Commonly known as: ROBAXIN methocarbamol 500 mg tablet   NovoLOG Mix 70/30 FlexPen (70-30) 100 UNIT/ML FlexPen Generic drug: insulin aspart protamine - aspart Inject 18 Units into the skin 2 (two) times daily with a meal.   ondansetron 8 MG tablet Commonly known  as: ZOFRAN ondansetron HCl 8 mg tablet   ondansetron 4 MG tablet Commonly known as: Zofran Take 1 tablet (4 mg total) by mouth every 8 (eight) hours as needed for nausea or vomiting.   Ozempic (1 MG/DOSE) 4 MG/3ML Sopn Generic drug: Semaglutide (1 MG/DOSE) INJECT 1MG SUBCUTANEOUSLY  WEEKLY (EVERY 7 DAYS).   ramipril 10 MG capsule Commonly known as: ALTACE Take 10 mg by mouth daily.   topiramate 50 MG tablet Commonly known as: TOPAMAX Take 25 mg by mouth at bedtime.   topiramate 25 MG tablet Commonly known as: TOPAMAX   traMADol 50 MG tablet Commonly known as: ULTRAM tramadol 50 mg tablet  TAKE 1 TABLET TWICE A DAY BY ORAL ROUTE AS NEEDED FOR 20 DAYS.        Allergies:  Allergies  Allergen Reactions   Liraglutide Other (See Comments)    Other reaction(s): Pancreatitis  *Victoza     Past Medical History:  Diagnosis Date   Anemia    Asthma    Diabetes mellitus    Family history of adverse reaction to anesthesia    Brother   Heart murmur    Hyperlipidemia    Hypertension    Pancreatitis    Pneumonia     Past Surgical History:  Procedure  Laterality Date   ABDOMINAL HYSTERECTOMY     ANTERIOR LAT LUMBAR FUSION N/A 07/07/2019   Procedure: ANTERIOR LATERAL LUMBAR FUSION (XLIF) LUMBAR TWO THROUGH LUMBAR THREE, POSTERIOR SPINAL FUSION INTERBODY LUMBAR TWO THROUGH LUMBAR THREE;  Surgeon: Melina Schools, MD;  Location: [place];  Service: Orthopedics;  Laterality: N/A;  4 HRS   CESAREAN SECTION     CHOLECYSTECTOMY     GANGLION CYST EXCISION     HIP CAPSULECTOMY Right 10/14/2018   Procedure: RIGHT HIP HETEROTOPIC OSSIFICATION RESECTION;  Surgeon: Shona Needles, MD;  Location: [place];  Service: Orthopedics;  Laterality: Right;   LAPAROSCOPY     LUMBAR FUSION      Family History  Problem Relation Age of Onset   Diabetes Mother    Hypertension Mother    Glaucoma Brother    Diabetes Maternal Aunt    Diabetes Paternal Aunt    Diabetes Maternal Grandmother    Heart disease Neg Hx     Social History:  reports that she quit smoking about 8 years ago. Her smoking use included cigarettes. She has never used smokeless tobacco. She reports current alcohol use. She reports that she does not use drugs.    Review of Systems    Lipid history: on Lipitor 10 mg She is taking it regularly with following results LDL is at target  Lab Results  Component Value Date   CHOL 133 11/05/2021   CHOL 163 03/26/2021   CHOL 185 11/21/2020   Lab Results  Component Value Date   HDL 54.20 11/05/2021   HDL 51.20 03/26/2021   HDL 50.70 11/21/2020   Lab Results  Component Value Date   LDLCALC 66 11/05/2021   LDLCALC 85 03/26/2021   LDLCALC 113 (H) 11/21/2020   Lab Results  Component Value Date   TRIG 65.0 11/05/2021   TRIG 134.0 03/26/2021   TRIG 105.0 11/21/2020   Lab Results  Component Value Date   CHOLHDL 2 11/05/2021   CHOLHDL 3 03/26/2021   CHOLHDL 4 11/21/2020   Lab Results  Component Value Date   LDLDIRECT 75.0 03/22/2018   LDLDIRECT 101.0 08/27/2015            Hypertension:  currently  on 4m amlodipine and 10 mg  ramipril   Hypertension is followed by PCP Home BP 130-140/80  BP Readings from Last 3 Encounters:  11/07/21 124/72  07/04/21 (!) 152/74  03/28/21 122/74   Potassium normal  Lab Results  Component Value Date   K 3.7 11/05/2021     Most recent foot exam: 6/23 Last eye exam report not available, she is going every 3 months   Review of Systems    Physical Examination:  BP 124/72   Pulse 91   Ht 5' 2"  (1.575 m)   Wt 173 lb 12.8 oz (78.8 kg)   LMP 07/09/2011   SpO2 99%   BMI 31.79 kg/m    ASSESSMENT:  Diabetes type 2, insulin-dependent  See history of present illness for detailed discussion of current diabetes management, blood sugar patterns and problems identified  She is on Ozempic, premixed insulin twice a day, Farxiga and Metformin  Her A1c is consistently at 6.6  She is overall showing good control with the above regimen and relatively low-dose insulin Not able to get up complete picture of her blood sugars because of inadequate monitoring or checking morning sugars only 20 to 30 minutes after the meal Discussed diet Weight management also discussed Discussed timing of insulin  Hypertension: Blood pressure is excellent now No hypokalemia  Lipids: Consistently controlled  PLAN:    Another prescription for freestyle libre sensor [facility] In the meantime she will try to check blood sugars more consistently 2 hours after meals are fasting and let [place] if they are unusually high or low Diet: Discussed balancing carbs and protein in avoiding sweetened oatmeal in the morning Should be able to adjust her diet further when she is on the CGM Continue walking regularly    Follow-up in 4 months   There are no Patient Instructions on file for this visit.      AElayne Snare10/26/2023, 9:19 AM   Note: This office note was prepared with Dragon voice recognition system technology. Any transcriptional errors that result from this process are  unintentional.

## 2021-11-07 ENCOUNTER — Encounter: Payer: Self-pay | Admitting: Endocrinology

## 2021-11-07 ENCOUNTER — Ambulatory Visit (INDEPENDENT_AMBULATORY_CARE_PROVIDER_SITE_OTHER): Payer: BC Managed Care – PPO | Admitting: Endocrinology

## 2021-11-07 VITALS — BP 124/72 | HR 91 | Ht 62.0 in | Wt 173.8 lb

## 2021-11-07 DIAGNOSIS — E1165 Type 2 diabetes mellitus with hyperglycemia: Secondary | ICD-10-CM

## 2021-11-07 DIAGNOSIS — E7800 Pure hypercholesterolemia, unspecified: Secondary | ICD-10-CM | POA: Diagnosis not present

## 2021-11-07 DIAGNOSIS — I10 Essential (primary) hypertension: Secondary | ICD-10-CM | POA: Diagnosis not present

## 2021-11-07 DIAGNOSIS — Z794 Long term (current) use of insulin: Secondary | ICD-10-CM | POA: Diagnosis not present

## 2021-11-07 MED ORDER — FREESTYLE LIBRE 3 SENSOR MISC: 1.0000 | 3 refills | Status: DC

## 2021-11-24 ENCOUNTER — Other Ambulatory Visit: Payer: Self-pay | Admitting: Endocrinology

## 2022-01-30 ENCOUNTER — Other Ambulatory Visit: Payer: Self-pay | Admitting: Endocrinology

## 2022-01-30 DIAGNOSIS — E1165 Type 2 diabetes mellitus with hyperglycemia: Secondary | ICD-10-CM

## 2022-03-11 ENCOUNTER — Other Ambulatory Visit (INDEPENDENT_AMBULATORY_CARE_PROVIDER_SITE_OTHER): Payer: BC Managed Care – PPO

## 2022-03-11 DIAGNOSIS — E1165 Type 2 diabetes mellitus with hyperglycemia: Secondary | ICD-10-CM | POA: Diagnosis not present

## 2022-03-11 DIAGNOSIS — Z794 Long term (current) use of insulin: Secondary | ICD-10-CM

## 2022-03-11 LAB — BASIC METABOLIC PANEL
BUN: 16 mg/dL (ref 6–23)
CO2: 24 mEq/L (ref 19–32)
Calcium: 10.2 mg/dL (ref 8.4–10.5)
Chloride: 102 mEq/L (ref 96–112)
Creatinine, Ser: 0.81 mg/dL (ref 0.40–1.20)
GFR: 81.08 mL/min (ref >60.00)
Glucose, Bld: 127 mg/dL — ABNORMAL HIGH (ref 70–99)
Potassium: 4.1 mEq/L (ref 3.5–5.1)
Sodium: 140 mEq/L (ref 135–145)

## 2022-03-11 LAB — MICROALBUMIN / CREATININE URINE RATIO
Creatinine,U: 108.3 mg/dL
Microalb Creat Ratio: 0.7 mg/g (ref 0.0–30.0)
Microalb, Ur: 0.7 mg/dL (ref 0.0–1.9)

## 2022-03-11 LAB — HEMOGLOBIN A1C: Hgb A1c MFr Bld: 6.3 % (ref 4.6–6.5)

## 2022-03-13 ENCOUNTER — Encounter: Payer: Self-pay | Admitting: Endocrinology

## 2022-03-13 ENCOUNTER — Ambulatory Visit (INDEPENDENT_AMBULATORY_CARE_PROVIDER_SITE_OTHER): Payer: BC Managed Care – PPO | Admitting: Endocrinology

## 2022-03-13 VITALS — BP 120/78 | HR 87 | Ht 62.0 in | Wt 164.0 lb

## 2022-03-13 DIAGNOSIS — E7800 Pure hypercholesterolemia, unspecified: Secondary | ICD-10-CM | POA: Diagnosis not present

## 2022-03-13 DIAGNOSIS — E1165 Type 2 diabetes mellitus with hyperglycemia: Secondary | ICD-10-CM

## 2022-03-13 DIAGNOSIS — Z794 Long term (current) use of insulin: Secondary | ICD-10-CM | POA: Diagnosis not present

## 2022-03-13 DIAGNOSIS — I10 Essential (primary) hypertension: Secondary | ICD-10-CM

## 2022-03-13 NOTE — Progress Notes (Signed)
Patient ID: Veronica Fletcher, female   DOB: 21-Aug-1965, 57 y.o.   MRN: [id number]           Reason for Appointment: Follow-up for Type 2 Diabetes  Referring physician: Harrington Challenger  History of Present Illness:          Date of diagnosis of type 2 diabetes mellitus: 2005       Background history:  She had been started on metformin initially and not clear what other treatment she had taken subsequently, records are being awaited today She apparently was doing very well with a combination of Victoza and metformin until 2015 and she thinks she was losing weight with this.  She had taken Victoza for several months at least However according to his lab records her best A1c in 2014 was only 7.9 Apparently she was taken off Victoza when she was admitted for abdominal pain presumed to be from pancreatitis in 01/2013 She was then started on premixed insulin and taken off metformin also on discharge. She was referred here for poorly controlled diabetes with A1c consistently around 10% On her initial consultation she was given metformin and Jardiance in addition to her insulin  V-go pump 20 unit basal and boluses 4 units previously stopped  Recent history:   INSULIN regimen is: Humalog Mix at meals: 18 a.m.-16 before dinner   Non-insulin hypoglycemic drugs: Farxiga 10 mg daily, metformin ER 1000 mg daily Ozempic 1.0 mg weekly  Her A1c is stable at 6.3  Current management, blood sugar patterns and problems identified:  She has changed her diet significantly recently and her weight has come down 9 pounds This is with mostly cutting back on carbohydrates, increasing fiber and vegetables and also less meat She has cut back on oatmeal and is eating more [place] yogurt in the morning She is walking fairly regularly up to 1 mile Although she was told to start the Roby sensor she was not able to do this and only started this today with the help of the nurse Mostly checking blood sugars in the morning on waking  up and these are fairly stable near normal Only once had a reading of about 152 after breakfast Lab glucose was 127 in the mornings No hypoglycemia reported   Side effects from medications have been:?  Victoza caused pancreatitis  Compliance with the medical regimen: Fair  Glucometer:  Accu-Chek guide   Blood Glucose readings from review of monitor   PRE-MEAL Fasting Lunch Dinner Bedtime Overall  Glucose range: 89-131 152     Mean/median:          :Self-care: The diet that the patient has been following is: tries to limit portions.      Typical meal intake: Breakfast is oatmeal/yogurt or egg/oatmeal, lunch is a grilled chicken, evening meal is meat and 2 vegetables, will have snacks on yogurt    Dinner at 8 pm           Dietician visit, most recent:2014                Weight history:   Wt Readings from Last 3 Encounters:  03/13/22 164 lb (74.4 kg)  11/07/21 173 lb 12.8 oz (78.8 kg)  07/04/21 175 lb 3.2 oz (79.5 kg)    Glycemic control:   Lab Results  Component Value Date   HGBA1C 6.3 03/11/2022   HGBA1C 6.6 (H) 11/05/2021   HGBA1C 6.6 (H) 07/02/2021   Lab Results  Component Value Date   MICROALBUR 0.7 03/11/2022  LDLCALC 66 11/05/2021   CREATININE 0.81 03/11/2022   Lab Results  Component Value Date   MICRALBCREAT 0.7 03/11/2022    Lab Results  Component Value Date   FRUCTOSAMINE 293 (H) 03/22/2018   FRUCTOSAMINE 250 08/12/2017   FRUCTOSAMINE 290 (H) 06/16/2017   FRUCTOSAMINE 284 10/22/2016     Lab on 03/11/2022  Component Date Value Ref Range Status   Microalb, Ur 03/11/2022 0.7  0.0 - 1.9 mg/dL Final   Creatinine,U 03/11/2022 108.3  mg/dL Final   Microalb Creat Ratio 03/11/2022 0.7  0.0 - 30.0 mg/g Final   Sodium 03/11/2022 140  135 - 145 mEq/L Final   Potassium 03/11/2022 4.1  3.5 - 5.1 mEq/L Final   Chloride 03/11/2022 102  96 - 112 mEq/L Final   CO2 03/11/2022 24  19 - 32 mEq/L Final   Glucose, Bld 03/11/2022 127 (H)  70 - 99 mg/dL Final    BUN 03/11/2022 16  6 - 23 mg/dL Final   Creatinine, Ser 03/11/2022 0.81  0.40 - 1.20 mg/dL Final   GFR 03/11/2022 81.08  >60.00 mL/min Final   Calculated using the CKD-EPI Creatinine Equation (2021)   Calcium 03/11/2022 10.2  8.4 - 10.5 mg/dL Final   Hgb A1c MFr Bld 03/11/2022 6.3  4.6 - 6.5 % Final   Glycemic Control Guidelines for People with Diabetes:Non Diabetic:  <6%Goal of Therapy: <7%Additional Action Suggested:  >8%       Allergies as of 03/13/2022       Reactions   Liraglutide Other (See Comments)   Other reaction(s): Pancreatitis *Victoza         Medication List        Accurate as of March 13, 2022 10:06 AM. If you have any questions, ask your nurse or doctor.          Accu-Chek FastClix Lancets Misc Use Accu Chek Fastclix lancets to check blood sugar twice daily.   Accu-Chek Guide test strip Generic drug: glucose blood Use to check blood sugar twice daily.   Accu-Chek Guide w/Device Kit 1 each by Does not apply route 2 (two) times a day. Use as instructed to check blood sugar twice daily.   albuterol 108 (90 Base) MCG/ACT inhaler Commonly known as: VENTOLIN HFA Inhale 1-2 puffs into the lungs every 4 (four) hours as needed for wheezing or shortness of breath.   amLODipine 5 MG tablet Commonly known as: NORVASC Take 5 mg by mouth daily.   aspirin EC 81 MG tablet Take 81 mg by mouth daily. Swallow whole.   atorvastatin 10 MG tablet Commonly known as: LIPITOR Take 1 tablet (10 mg total) by mouth daily at 6 PM.   B-D UF III MINI PEN NEEDLES 31G X 5 MM Misc Generic drug: Insulin Pen Needle 1 EACH BY DOES NOT APPLY ROUTE 2 (TWO) TIMES DAILY. USE TO INJECT INSULIN TWICE DAILY.   Biofreeze Roll-On 4 % Gel Generic drug: Menthol (Topical Analgesic)   celecoxib 200 MG capsule Commonly known as: CELEBREX   Farxiga 10 MG Tabs tablet Generic drug: dapagliflozin propanediol TAKE 1 TABLET BY MOUTH EVERY DAY BEFORE BREAKFAST   fluticasone 110  MCG/ACT inhaler Commonly known as: FLOVENT HFA Flovent HFA 110 mcg/actuation aerosol inhaler   fluticasone 50 MCG/ACT nasal spray Commonly known as: FLONASE Place 1 spray into both nostrils daily as needed for allergies or rhinitis.   FreeStyle Libre 3 Sensor Misc 1 Device by Does not apply route every 14 (fourteen) days. Apply 1 sensor on upper  arm every 14 days for continuous glucose monitoring   gabapentin 300 MG capsule Commonly known as: NEURONTIN Take 300 mg by mouth at bedtime.   HumaLOG Mix 75/25 (75-25) 100 UNIT/ML Susp injection Generic drug: insulin lispro protamine-lispro Inject 18 Units into the skin 2 (two) times daily with a meal. What changed: additional instructions   ibuprofen 600 MG tablet Commonly known as: ADVIL Take 600 mg by mouth every 6 (six) hours.   meloxicam 15 MG tablet Commonly known as: MOBIC   metFORMIN 500 MG 24 hr tablet Commonly known as: GLUCOPHAGE-XR Take 2 tablets (1,000 mg total) by mouth daily.   methocarbamol 500 MG tablet Commonly known as: ROBAXIN   NovoLOG Mix 70/30 FlexPen (70-30) 100 UNIT/ML FlexPen Generic drug: insulin aspart protamine - aspart INJECT 18 UNITS INTO THE SKIN 2 (TWO) TIMES DAILY WITH A MEAL.   ondansetron 8 MG tablet Commonly known as: ZOFRAN   ondansetron 4 MG tablet Commonly known as: Zofran Take 1 tablet (4 mg total) by mouth every 8 (eight) hours as needed for nausea or vomiting.   Ozempic (1 MG/DOSE) 4 MG/3ML Sopn Generic drug: Semaglutide (1 MG/DOSE) INJECT '1MG'$  SUBCUTANEOUSLY  WEEKLY (EVERY 7 DAYS)   ramipril 10 MG capsule Commonly known as: ALTACE Take 10 mg by mouth daily.   topiramate 50 MG tablet Commonly known as: TOPAMAX Take 25 mg by mouth at bedtime.   topiramate 25 MG tablet Commonly known as: TOPAMAX   traMADol 50 MG tablet Commonly known as: ULTRAM        Allergies:  Allergies  Allergen Reactions   Liraglutide Other (See Comments)    Other reaction(s):  Pancreatitis  *Victoza     Past Medical History:  Diagnosis Date   Anemia    Asthma    Diabetes mellitus    Family history of adverse reaction to anesthesia    Brother   Heart murmur    Hyperlipidemia    Hypertension    Pancreatitis    Pneumonia     Past Surgical History:  Procedure Laterality Date   ABDOMINAL HYSTERECTOMY     ANTERIOR LAT LUMBAR FUSION N/A 07/07/2019   Procedure: ANTERIOR LATERAL LUMBAR FUSION (XLIF) LUMBAR TWO THROUGH LUMBAR THREE, POSTERIOR SPINAL FUSION INTERBODY LUMBAR TWO THROUGH LUMBAR THREE;  Surgeon: Melina Schools, MD;  Location: [facility];  Service: Orthopedics;  Laterality: N/A;  4 HRS   CESAREAN SECTION     CHOLECYSTECTOMY     GANGLION CYST EXCISION     HIP CAPSULECTOMY Right 10/14/2018   Procedure: RIGHT HIP HETEROTOPIC OSSIFICATION RESECTION;  Surgeon: Shona Needles, MD;  Location: [facility];  Service: Orthopedics;  Laterality: Right;   LAPAROSCOPY     LUMBAR FUSION      Family History  Problem Relation Age of Onset   Diabetes Mother    Hypertension Mother    Glaucoma Brother    Diabetes Maternal Aunt    Diabetes Paternal Aunt    Diabetes Maternal Grandmother    Heart disease Neg Hx     Social History:  reports that she quit smoking about 9 years ago. Her smoking use included cigarettes. She has never used smokeless tobacco. She reports current alcohol use. She reports that she does not use drugs.    Review of Systems    Lipid history: on Lipitor 10 mg She is taking it regularly with following results LDL is at target  Lab Results  Component Value Date   CHOL 133 11/05/2021   CHOL 163  03/26/2021   CHOL 185 11/21/2020   Lab Results  Component Value Date   HDL 54.20 11/05/2021   HDL 51.20 03/26/2021   HDL 50.70 11/21/2020   Lab Results  Component Value Date   LDLCALC 66 11/05/2021   LDLCALC 85 03/26/2021   LDLCALC 113 (H) 11/21/2020   Lab Results  Component Value Date   TRIG 65.0 11/05/2021   TRIG 134.0 03/26/2021    TRIG 105.0 11/21/2020   Lab Results  Component Value Date   CHOLHDL 2 11/05/2021   CHOLHDL 3 03/26/2021   CHOLHDL 4 11/21/2020   Lab Results  Component Value Date   LDLDIRECT 75.0 03/22/2018   LDLDIRECT 101.0 08/27/2015            Hypertension:  currently on '5mg'$  amlodipine and 10 mg ramipril   Hypertension is followed by PCP Home BP not checked lately  BP Readings from Last 3 Encounters:  03/13/22 120/78  11/07/21 124/72  07/04/21 (!) 152/74   Potassium normal  Lab Results  Component Value Date   K 4.1 03/11/2022     Most recent foot exam: 6/23 Last eye exam report not available, she is going to Dr Katy Fitch   Review of Systems    Physical Examination:  BP 120/78 (BP Location: Left Arm, Patient Position: Sitting, Cuff Size: Small)   Pulse 87   Ht '5\' 2"'$  (1.575 m)   Wt 164 lb (74.4 kg)   LMP 07/09/2011   SpO2 97%   BMI 30.00 kg/m    ASSESSMENT:  Diabetes type 2, insulin-dependent  See history of present illness for detailed discussion of current diabetes management, blood sugar patterns and problems identified  She is on Ozempic, premixed insulin twice a day, Farxiga and Metformin  Her A1c is improved at 6.3, previously was at 6.6  She is doing significantly better with weight loss and lifestyle changes Has not needed any adjustment of her insulin dose but has only fasting blood sugars checked at home She did finally get the freestyle libre 3 sensor and this was installed in the office for her and shown her how to use it  Hypertension: Blood pressure is well-controlled Microalbumin normal   Lipids: Consistently controlled  PLAN:    She will start the freestyle libre as shown today in the office No change in insulin as yet unless blood sugars start getting unusually high or low Continue lifestyle changes  Advised her to start checking blood pressure regularly at home and let us know if it is low, no change in medications at this  time  Follow-up in 4 months   There are no Patient Instructions on file for this visit.  Total visit time for evaluation management, instructions on libre use and counseling = 30 minutes    Elayne Snare 03/13/2022, 10:06 AM   Note: This office note was prepared with Dragon voice recognition system technology. Any transcriptional errors that result from this process are unintentional.

## 2022-03-13 NOTE — Patient Instructions (Signed)
Check blood sugars on waking up   Also check blood sugars about 2 hours after meals and do this after different meals by rotation  Recommended blood sugar levels on waking up are 90-130 and about 2 hours after meal is 130-160  Please bring your blood sugar monitor to each visit, thank you

## 2022-03-20 DIAGNOSIS — E782 Mixed hyperlipidemia: Secondary | ICD-10-CM | POA: Diagnosis not present

## 2022-03-20 DIAGNOSIS — Z23 Encounter for immunization: Secondary | ICD-10-CM | POA: Diagnosis not present

## 2022-03-20 DIAGNOSIS — R011 Cardiac murmur, unspecified: Secondary | ICD-10-CM | POA: Diagnosis not present

## 2022-03-20 DIAGNOSIS — Z0000 Encounter for general adult medical examination without abnormal findings: Secondary | ICD-10-CM | POA: Diagnosis not present

## 2022-03-20 DIAGNOSIS — I10 Essential (primary) hypertension: Secondary | ICD-10-CM | POA: Diagnosis not present

## 2022-03-20 DIAGNOSIS — J4520 Mild intermittent asthma, uncomplicated: Secondary | ICD-10-CM | POA: Diagnosis not present

## 2022-04-08 ENCOUNTER — Ambulatory Visit: Payer: BC Managed Care – PPO | Admitting: Cardiology

## 2022-04-08 ENCOUNTER — Encounter: Payer: Self-pay | Admitting: Cardiology

## 2022-04-08 VITALS — BP 143/76 | HR 93 | Resp 17 | Ht 62.0 in | Wt 161.6 lb

## 2022-04-08 DIAGNOSIS — R0989 Other specified symptoms and signs involving the circulatory and respiratory systems: Secondary | ICD-10-CM

## 2022-04-08 DIAGNOSIS — E119 Type 2 diabetes mellitus without complications: Secondary | ICD-10-CM | POA: Diagnosis not present

## 2022-04-08 DIAGNOSIS — I10 Essential (primary) hypertension: Secondary | ICD-10-CM

## 2022-04-08 DIAGNOSIS — E1169 Type 2 diabetes mellitus with other specified complication: Secondary | ICD-10-CM

## 2022-04-08 DIAGNOSIS — R011 Cardiac murmur, unspecified: Secondary | ICD-10-CM

## 2022-04-08 DIAGNOSIS — E785 Hyperlipidemia, unspecified: Secondary | ICD-10-CM

## 2022-04-08 NOTE — Progress Notes (Signed)
ID:  Veronica Fletcher, DOB 1965/02/23, MRN IN:[id number]  PCP:  Lawerance Cruel, MD  Cardiologist:  Rex Kras, DO, [facility] (established care 04/08/2022)  REASON FOR CONSULT: Heart Murmur   REQUESTING PHYSICIAN:  Lawerance Cruel, [place],  [place] [id number]  Chief Complaint  Patient presents with   Heart Murmur   New Patient (Initial Visit)    HPI  Veronica Fletcher is a 57 y.o. African-American female who presents to the clinic for evaluation of heart murmur at the request of Lawerance Cruel, MD. Her past medical history and cardiovascular risk factors include: Hypertension, hyperlipidemia, insulin-dependent diabetes mellitus type 2.  Patient is referred to the practice for evaluation of a cardiac murmur.  Patient states that she was born with a murmur but does not have additional details with regards to which type of valve disease and/or any prior intervention.  To the best of her memory she has not had any cardiac surgery or procedures to correct any valvular heart disease growing up.  She would routinely get echocardiograms prior to participating in exercise/sports.  Does not recall exactly when her last adult echocardiogram was performed.  FUNCTIONAL STATUS: Walks her dog daily - 30-45 min / day.    ALLERGIES: Allergies  Allergen Reactions   Liraglutide Other (See Comments)    Other reaction(s): Pancreatitis  *Victoza     MEDICATION LIST PRIOR TO VISIT: Current Meds  Medication Sig   Accu-Chek FastClix Lancets MISC Use Accu Chek Fastclix lancets to check blood sugar twice daily.   albuterol (VENTOLIN HFA) 108 (90 Base) MCG/ACT inhaler Inhale 1-2 puffs into the lungs every 4 (four) hours as needed for wheezing or shortness of breath.   amLODipine (NORVASC) 5 MG tablet Take 5 mg by mouth daily.   aspirin EC 81 MG tablet Take 81 mg by mouth daily. Swallow whole.   atorvastatin (LIPITOR) 10 MG tablet Take 1 tablet (10 mg total) by mouth daily at 6 PM.    Blood Glucose Monitoring Suppl (ACCU-CHEK GUIDE) w/Device KIT 1 each by Does not apply route 2 (two) times a day. Use as instructed to check blood sugar twice daily.   Continuous Blood Gluc Sensor (FREESTYLE LIBRE 3 SENSOR) MISC 1 Device by Does not apply route every 14 (fourteen) days. Apply 1 sensor on upper arm every 14 days for continuous glucose monitoring   FARXIGA 10 MG TABS tablet TAKE 1 TABLET BY MOUTH EVERY DAY BEFORE BREAKFAST   fluticasone (FLONASE) 50 MCG/ACT nasal spray Place 1 spray into both nostrils daily as needed for allergies or rhinitis.   fluticasone (FLOVENT HFA) 110 MCG/ACT inhaler Flovent HFA 110 mcg/actuation aerosol inhaler   glucose blood (ACCU-CHEK GUIDE) test strip Use to check blood sugar twice daily.   ibuprofen (ADVIL) 600 MG tablet Take 600 mg by mouth every 6 (six) hours.   insulin aspart protamine - aspart (NOVOLOG MIX 70/30 FLEXPEN) (70-30) 100 UNIT/ML FlexPen INJECT 18 UNITS INTO THE SKIN 2 (TWO) TIMES DAILY WITH A MEAL.   insulin lispro protamine-lispro (HUMALOG MIX 75/25) (75-25) 100 UNIT/ML SUSP injection Inject 18 Units into the skin 2 (two) times daily with a meal. (Patient taking differently: Inject 18 Units into the skin 2 (two) times daily with a meal. 14 units in am and 16 at night)   Insulin Pen Needle (B-D UF III MINI PEN NEEDLES) 31G X 5 MM MISC 1 EACH BY DOES NOT APPLY ROUTE 2 (TWO) TIMES DAILY. USE TO INJECT INSULIN TWICE  DAILY.   metFORMIN (GLUCOPHAGE-XR) 500 MG 24 hr tablet Take 2 tablets (1,000 mg total) by mouth daily.   Naproxen Sod-diphenhydrAMINE (ALEVE PM) 220-25 MG TABS 2 tablets Orally at bedtime   ondansetron (ZOFRAN) 8 MG tablet    OZEMPIC, 1 MG/DOSE, 4 MG/3ML SOPN INJECT 1MG  SUBCUTANEOUSLY  WEEKLY (EVERY 7 DAYS)   promethazine (PHENERGAN) 25 MG tablet Take 25 mg by mouth every 8 (eight) hours as needed.   ramipril (ALTACE) 10 MG capsule Take 10 mg by mouth daily.   topiramate (TOPAMAX) 25 MG tablet      PAST MEDICAL HISTORY: Past  Medical History:  Diagnosis Date   Anemia    Asthma    Diabetes mellitus    Family history of adverse reaction to anesthesia    Brother   Heart murmur    Hyperlipidemia    Hypertension    Pancreatitis    Pneumonia     PAST SURGICAL HISTORY: Past Surgical History:  Procedure Laterality Date   ABDOMINAL HYSTERECTOMY     ANTERIOR LAT LUMBAR FUSION N/A 07/07/2019   Procedure: ANTERIOR LATERAL LUMBAR FUSION (XLIF) LUMBAR TWO THROUGH LUMBAR THREE, POSTERIOR SPINAL FUSION INTERBODY LUMBAR TWO THROUGH LUMBAR THREE;  Surgeon: Melina Schools, MD;  Location: [facility];  Service: Orthopedics;  Laterality: N/A;  4 HRS   CESAREAN SECTION     CHOLECYSTECTOMY     GANGLION CYST EXCISION     HIP CAPSULECTOMY Right 10/14/2018   Procedure: RIGHT HIP HETEROTOPIC OSSIFICATION RESECTION;  Surgeon: Shona Needles, MD;  Location: [facility];  Service: Orthopedics;  Laterality: Right;   LAPAROSCOPY     LUMBAR FUSION      FAMILY HISTORY: The patient family history includes Diabetes in her maternal aunt, maternal grandmother, mother, and paternal aunt; Glaucoma in her brother and mother; Hypertension in her brother and mother; Melanoma in her brother; Stroke in her mother.  SOCIAL HISTORY:  The patient  reports that she has been smoking cigars. She has never used smokeless tobacco. She reports current alcohol use. She reports that she does not use drugs.  REVIEW OF SYSTEMS: Review of Systems  Cardiovascular:  Negative for chest pain, claudication, dyspnea on exertion, irregular heartbeat, leg swelling, near-syncope, orthopnea, palpitations, paroxysmal nocturnal dyspnea and syncope.  Respiratory:  Negative for shortness of breath.   Hematologic/Lymphatic: Negative for bleeding problem.  Musculoskeletal:  Negative for muscle cramps and myalgias.  Neurological:  Negative for dizziness and light-headedness.    PHYSICAL EXAM:    04/08/2022    8:45 AM 03/13/2022    9:32 AM 11/07/2021    9:03 AM  Vitals with  BMI  Height 5\' 2"  5\' 2"  5\' 2"   Weight 161 lbs 10 oz 164 lbs 173 lbs 13 oz  BMI 29.55 [id number]  Systolic [id number]  Diastolic 76 78 72  Pulse 93 87 91    Physical Exam  Constitutional: No distress.  Age appropriate, hemodynamically stable.   Neck: No JVD present.  Cardiovascular: Normal rate, regular rhythm, S1 normal, S2 normal, intact distal pulses and normal pulses. Exam reveals no gallop, no S3 and no S4.  Murmur heard. Crescendo-decrescendo systolic murmur is present with a grade of 3/6 at the upper right sternal border radiating to the neck. Holosystolic  murmur of grade 3/6 is also present at the apex. Pulses:      Carotid pulses are  on the right side with bruit. Pulmonary/Chest: Effort normal and breath sounds normal. No stridor. She has no wheezes. She  has no rales.  Abdominal: Soft. Bowel sounds are normal. She exhibits no distension. There is no abdominal tenderness.  Musculoskeletal:        General: No edema.     Cervical back: Neck supple.  Neurological: She is alert and oriented to person, place, and time. She has intact cranial nerves (2-12).  Skin: Skin is warm and moist.   CARDIAC DATABASE: EKG: 04/08/2022: Sinus rhythm, 88 bpm, normal axis, biatrial enlargement, borderline first-degree AV block, poor R wave progression, without underlying ischemia or injury  Echocardiogram: No results found for this or any previous visit from the past 1095 days.    Stress Testing: No results found for this or any previous visit from the past 1095 days.   Heart Catheterization: None  LABORATORY DATA:    Latest Ref Rng & Units 07/09/2019    5:06 AM 07/05/2019   10:15 AM 10/15/2018    3:11 AM  CBC  WBC 4.0 - 10.5 K/uL 15.2  8.0  11.2   Hemoglobin 12.0 - 15.0 g/dL 12.9  13.9  11.5   Hematocrit 36.0 - 46.0 % 39.7  45.6  35.0   Platelets 150 - 400 K/uL 190  178  PLATELET CLUMPS NOTED ON SMEAR, UNABLE TO ESTIMATE        Latest Ref Rng & Units 03/11/2022    9:27 AM  11/05/2021    8:39 AM 07/02/2021    8:38 AM  CMP  Glucose 70 - 99 mg/dL 127  116  61   BUN 6 - 23 mg/dL 16  16  17    Creatinine 0.40 - 1.20 mg/dL 0.81  0.71  0.85   Sodium 135 - 145 mEq/L 140  138  139   Potassium 3.5 - 5.1 mEq/L 4.1  3.7  3.8   Chloride 96 - 112 mEq/L 102  104  104   CO2 19 - 32 mEq/L 24  27  26    Calcium 8.4 - 10.5 mg/dL 10.2  9.3  9.1   Total Protein 6.0 - 8.3 g/dL  7.3    Total Bilirubin 0.2 - 1.2 mg/dL  0.6    Alkaline Phos 39 - 117 U/L  72    AST 0 - 37 U/L  18    ALT 0 - 35 U/L  13      Lipid Panel     Component Value Date/Time   CHOL 133 11/05/2021 0839   TRIG 65.0 11/05/2021 0839   HDL 54.20 11/05/2021 0839   CHOLHDL 2 11/05/2021 0839   VLDL 13.0 11/05/2021 0839   LDLCALC 66 11/05/2021 0839   LDLDIRECT 75.0 03/22/2018 1027    No components found for: "NTPROBNP" No results for input(s): "PROBNP" in the last 8760 hours. No results for input(s): "TSH" in the last 8760 hours.  BMP Recent Labs    07/02/21 0838 11/05/21 0839 03/11/22 0927  NA 139 138 140  K 3.8 3.7 4.1  CL 104 104 102  CO2 26 27 24   GLUCOSE 61* 116* 127*  BUN 17 16 16   CREATININE 0.85 0.71 0.81  CALCIUM 9.1 9.3 10.2    HEMOGLOBIN A1C Lab Results  Component Value Date   HGBA1C 6.3 03/11/2022   MPG 139.85 07/07/2019   External Labs: Collected: 11/01/2021. Total cholesterol 133, triglycerides 65, HDL 54, LDL calculated 66, non-HDL 79   IMPRESSION:    ICD-10-CM   1. Heart murmur  R01.1 EKG 12-Lead    PCV ECHOCARDIOGRAM COMPLETE    2. Bruit of right  carotid artery  R09.89     3. Type 2 diabetes mellitus without complication, with long-term current use of insulin (HCC)  E11.9    Z79.4     4. Benign hypertension  I10     5. Type 2 diabetes mellitus with hyperlipidemia [facility])  E11.69    E78.5        RECOMMENDATIONS: Niambi Hyett is a 57 y.o. African-American female whose past medical history and cardiac risk factors include: Hypertension, hyperlipidemia,  insulin-dependent diabetes mellitus type 2.  Heart murmur Longstanding history of a cardiac murmur since she was a child according to the patient. No prior echocardiograms available for review/comparison. Echo will be ordered to evaluate for structural heart disease and left ventricular systolic function.   I suspect that she has a degree of aortic stenosis and mitral regurgitation. Further recommendations to follow  Carotid bruit Right carotid bruits more pronounced. Would like to proceed with a carotid duplex to rule out organic disease versus delayed carotid upstrokes due to valvular disease.  Type 2 diabetes mellitus without complication, with long-term current use of insulin (HCC) Hemoglobin A1c is very well-controlled. Follows with endocrinology. Medications reconciled.  Benign hypertension Office blood pressures within acceptable limits. However, home blood pressures are better controlled. Low-salt diet recommended. Currently managed by primary care provider.  Type 2 diabetes mellitus with hyperlipidemia (HCC) Currently on atorvastatin.   She denies myalgia or other side effects. Most recent lipids dated October 2023, independently reviewed as noted above. Recommend a goal LDL <70 mg/dL. At times she does have myalgias with atorvastatin.  She is currently taking a break and I have asked her to retry once symptom-free, given her other comorbid conditions.  Will proceed with a coronary calcium score for further risk stratification. Currently managed by primary care provider.  Data Reviewed: I have independently reviewed external notes provided by the referring provider as part of this office visit.   I have independently reviewed results of EKG, labs as part of medical decision making. I have ordered the following tests:  Orders Placed This Encounter  Procedures   CT CARDIAC SCORING (SELF PAY ONLY)    Standing Status:   Future    Standing Expiration Date:   04/08/2023     Order Specific Question:   Is patient pregnant?    Answer:   No    Order Specific Question:   Preferred imaging location?    Answer:   External   EKG 12-Lead   PCV ECHOCARDIOGRAM COMPLETE    Standing Status:   Future    Standing Expiration Date:   04/08/2023   I have made no medications changes at today's encounter as noted above.   FINAL MEDICATION LIST END OF ENCOUNTER: No orders of the defined types were placed in this encounter.   Medications Discontinued During This Encounter  Medication Reason   celecoxib (CELEBREX) 200 MG capsule    gabapentin (NEURONTIN) 300 MG capsule    meloxicam (MOBIC) 15 MG tablet    Menthol, Topical Analgesic, (BIOFREEZE ROLL-ON) 4 % GEL    methocarbamol (ROBAXIN) 500 MG tablet    ondansetron (ZOFRAN) 4 MG tablet    Semaglutide, 1 MG/DOSE, (OZEMPIC, 1 MG/DOSE,) 2 MG/1.5ML SOPN    topiramate (TOPAMAX) 50 MG tablet    traMADol (ULTRAM) 50 MG tablet      Current Outpatient Medications:    Accu-Chek FastClix Lancets MISC, Use Accu Chek Fastclix lancets to check blood sugar twice daily., Disp: 204 each, Rfl: 3   albuterol (  VENTOLIN HFA) 108 (90 Base) MCG/ACT inhaler, Inhale 1-2 puffs into the lungs every 4 (four) hours as needed for wheezing or shortness of breath., Disp: , Rfl:    amLODipine (NORVASC) 5 MG tablet, Take 5 mg by mouth daily., Disp: , Rfl:    aspirin EC 81 MG tablet, Take 81 mg by mouth daily. Swallow whole., Disp: , Rfl:    atorvastatin (LIPITOR) 10 MG tablet, Take 1 tablet (10 mg total) by mouth daily at 6 PM., Disp: 90 tablet, Rfl: 3   Blood Glucose Monitoring Suppl (ACCU-CHEK GUIDE) w/Device KIT, 1 each by Does not apply route 2 (two) times a day. Use as instructed to check blood sugar twice daily., Disp: 1 kit, Rfl: 0   Continuous Blood Gluc Sensor (FREESTYLE LIBRE 3 SENSOR) MISC, 1 Device by Does not apply route every 14 (fourteen) days. Apply 1 sensor on upper arm every 14 days for continuous glucose monitoring, Disp: 2 each, Rfl: 3    FARXIGA 10 MG TABS tablet, TAKE 1 TABLET BY MOUTH EVERY DAY BEFORE BREAKFAST, Disp: 30 tablet, Rfl: 3   fluticasone (FLONASE) 50 MCG/ACT nasal spray, Place 1 spray into both nostrils daily as needed for allergies or rhinitis., Disp: , Rfl:    fluticasone (FLOVENT HFA) 110 MCG/ACT inhaler, Flovent HFA 110 mcg/actuation aerosol inhaler, Disp: , Rfl:    glucose blood (ACCU-CHEK GUIDE) test strip, Use to check blood sugar twice daily., Disp: 200 strip, Rfl: 3   ibuprofen (ADVIL) 600 MG tablet, Take 600 mg by mouth every 6 (six) hours., Disp: , Rfl:    insulin aspart protamine - aspart (NOVOLOG MIX 70/30 FLEXPEN) (70-30) 100 UNIT/ML FlexPen, INJECT 18 UNITS INTO THE SKIN 2 (TWO) TIMES DAILY WITH A MEAL., Disp: 45 mL, Rfl: 2   insulin lispro protamine-lispro (HUMALOG MIX 75/25) (75-25) 100 UNIT/ML SUSP injection, Inject 18 Units into the skin 2 (two) times daily with a meal. (Patient taking differently: Inject 18 Units into the skin 2 (two) times daily with a meal. 14 units in am and 16 at night), Disp: 45 mL, Rfl: 1   Insulin Pen Needle (B-D UF III MINI PEN NEEDLES) 31G X 5 MM MISC, 1 EACH BY DOES NOT APPLY ROUTE 2 (TWO) TIMES DAILY. USE TO INJECT INSULIN TWICE DAILY., Disp: 100 each, Rfl: 2   metFORMIN (GLUCOPHAGE-XR) 500 MG 24 hr tablet, Take 2 tablets (1,000 mg total) by mouth daily., Disp: 180 tablet, Rfl: 3   Naproxen Sod-diphenhydrAMINE (ALEVE PM) 220-25 MG TABS, 2 tablets Orally at bedtime, Disp: , Rfl:    ondansetron (ZOFRAN) 8 MG tablet, , Disp: , Rfl:    OZEMPIC, 1 MG/DOSE, 4 MG/3ML SOPN, INJECT 1MG  SUBCUTANEOUSLY  WEEKLY (EVERY 7 DAYS), Disp: 9 mL, Rfl: 2   promethazine (PHENERGAN) 25 MG tablet, Take 25 mg by mouth every 8 (eight) hours as needed., Disp: , Rfl:    ramipril (ALTACE) 10 MG capsule, Take 10 mg by mouth daily., Disp: , Rfl:    topiramate (TOPAMAX) 25 MG tablet, , Disp: , Rfl:   Orders Placed This Encounter  Procedures   EKG 12-Lead   PCV ECHOCARDIOGRAM COMPLETE    There are  no Patient Instructions on file for this visit.   --Continue cardiac medications as reconciled in final medication list. --No follow-ups on file. or sooner if needed. --Continue follow-up with your primary care physician regarding the management of your other chronic comorbid conditions.  Patient's questions and concerns were addressed to her satisfaction. She voices understanding of  the instructions provided during this encounter.   This note was created using a voice recognition software as a result there may be grammatical errors inadvertently enclosed that do not reflect the nature of this encounter. Every attempt is made to correct such errors.  Rex Kras, [place], [facility]  Pager:  [phone] Office: [phone]

## 2022-05-01 ENCOUNTER — Ambulatory Visit (HOSPITAL_COMMUNITY)
Admission: RE | Admit: 2022-05-01 | Discharge: 2022-05-01 | Disposition: A | Discharge status: Home / Self Care | Payer: BC Managed Care – PPO | Source: Ambulatory Visit | Attending: Cardiology | Admitting: Cardiology

## 2022-05-01 DIAGNOSIS — Z794 Long term (current) use of insulin: Secondary | ICD-10-CM | POA: Insufficient documentation

## 2022-05-01 DIAGNOSIS — E1169 Type 2 diabetes mellitus with other specified complication: Secondary | ICD-10-CM | POA: Insufficient documentation

## 2022-05-01 DIAGNOSIS — E119 Type 2 diabetes mellitus without complications: Secondary | ICD-10-CM | POA: Insufficient documentation

## 2022-05-01 DIAGNOSIS — E785 Hyperlipidemia, unspecified: Secondary | ICD-10-CM | POA: Insufficient documentation

## 2022-05-07 ENCOUNTER — Ambulatory Visit: Payer: BC Managed Care – PPO

## 2022-05-07 DIAGNOSIS — I10 Essential (primary) hypertension: Secondary | ICD-10-CM | POA: Diagnosis not present

## 2022-05-07 DIAGNOSIS — R011 Cardiac murmur, unspecified: Secondary | ICD-10-CM | POA: Diagnosis not present

## 2022-05-07 DIAGNOSIS — R0989 Other specified symptoms and signs involving the circulatory and respiratory systems: Secondary | ICD-10-CM | POA: Diagnosis not present

## 2022-05-07 NOTE — Progress Notes (Signed)
Called and spoke with patient regarding her CT cardiac scoring test results.

## 2022-05-07 NOTE — Progress Notes (Signed)
LMTCB

## 2022-05-13 DIAGNOSIS — D2272 Melanocytic nevi of left lower limb, including hip: Secondary | ICD-10-CM | POA: Diagnosis not present

## 2022-05-13 DIAGNOSIS — L814 Other melanin hyperpigmentation: Secondary | ICD-10-CM | POA: Diagnosis not present

## 2022-05-13 DIAGNOSIS — L818 Other specified disorders of pigmentation: Secondary | ICD-10-CM | POA: Diagnosis not present

## 2022-05-13 DIAGNOSIS — L821 Other seborrheic keratosis: Secondary | ICD-10-CM | POA: Diagnosis not present

## 2022-05-13 DIAGNOSIS — L7211 Pilar cyst: Secondary | ICD-10-CM | POA: Diagnosis not present

## 2022-05-13 DIAGNOSIS — D485 Neoplasm of uncertain behavior of skin: Secondary | ICD-10-CM | POA: Diagnosis not present

## 2022-05-13 DIAGNOSIS — L308 Other specified dermatitis: Secondary | ICD-10-CM | POA: Diagnosis not present

## 2022-05-22 ENCOUNTER — Ambulatory Visit: Payer: BC Managed Care – PPO | Admitting: Cardiology

## 2022-05-22 ENCOUNTER — Encounter: Payer: Self-pay | Admitting: Cardiology

## 2022-05-22 VITALS — BP 148/75 | HR 99 | Resp 18 | Ht 62.0 in | Wt 167.0 lb

## 2022-05-22 DIAGNOSIS — I10 Essential (primary) hypertension: Secondary | ICD-10-CM

## 2022-05-22 DIAGNOSIS — E782 Mixed hyperlipidemia: Secondary | ICD-10-CM

## 2022-05-22 DIAGNOSIS — I059 Rheumatic mitral valve disease, unspecified: Secondary | ICD-10-CM | POA: Diagnosis not present

## 2022-05-22 DIAGNOSIS — R931 Abnormal findings on diagnostic imaging of heart and coronary circulation: Secondary | ICD-10-CM | POA: Diagnosis not present

## 2022-05-22 DIAGNOSIS — I2510 Atherosclerotic heart disease of native coronary artery without angina pectoris: Secondary | ICD-10-CM | POA: Diagnosis not present

## 2022-05-22 DIAGNOSIS — Z794 Long term (current) use of insulin: Secondary | ICD-10-CM

## 2022-05-22 DIAGNOSIS — I700 Atherosclerosis of aorta: Secondary | ICD-10-CM

## 2022-05-22 DIAGNOSIS — E1165 Type 2 diabetes mellitus with hyperglycemia: Secondary | ICD-10-CM

## 2022-05-22 MED ORDER — ROSUVASTATIN CALCIUM 20 MG PO TABS: 20.0000 mg | ORAL_TABLET | Freq: Every day | ORAL | 0 refills | Status: DC

## 2022-05-22 NOTE — Progress Notes (Signed)
ID:  Veronica Fletcher, DOB 1965/10/22, MRN [id number]  PCP:  Daisy Floro, MD  Cardiologist:  Tessa Lerner, DO, [facility] (established care 04/08/2022)  Date: 05/22/22 Last Office Visit: 04/08/2022  Chief Complaint  Patient presents with   Heart Murmur   CAC   Follow-up    HPI  Veronica Fletcher is a 57 y.o. African-American female whose past medical history and cardiovascular risk factors include: Moderate CAC (total CAC 108, 95th percentile), Aortic atherosclerosis, moderate mitral stenosis, hypertension, hyperlipidemia, insulin-dependent diabetes mellitus type 2.  Patient is referred to the practice for evaluation of a cardiac murmur.  Since last office visit she had an echocardiogram which noted moderate mitral valve stenosis with moderately dilated left atrium, grade 2 diastolic dysfunction, elevated left atrial pressures.  She also had a carotid duplex which showed no significant ICA disease.  She also had a coronary calcium score which noted moderate CAC placing her at the 95th percentile.  She presents today for follow-up. Denies anginal chest pain or heart failure symptoms.  FUNCTIONAL STATUS: Walks her dog daily - 30-45 min / day.    ALLERGIES: Allergies  Allergen Reactions   Liraglutide Other (See Comments)    Other reaction(s): Pancreatitis  *Victoza     MEDICATION LIST PRIOR TO VISIT: Current Meds  Medication Sig   Accu-Chek FastClix Lancets MISC Use Accu Chek Fastclix lancets to check blood sugar twice daily.   albuterol (VENTOLIN HFA) 108 (90 Base) MCG/ACT inhaler Inhale 1-2 puffs into the lungs every 4 (four) hours as needed for wheezing or shortness of breath.   amLODipine (NORVASC) 5 MG tablet Take 5 mg by mouth daily.   aspirin EC 81 MG tablet Take 81 mg by mouth daily. Swallow whole.   Blood Glucose Monitoring Suppl (ACCU-CHEK GUIDE) w/Device KIT 1 each by Does not apply route 2 (two) times a day. Use as instructed to check blood sugar twice daily.    Continuous Blood Gluc Sensor (FREESTYLE LIBRE 3 SENSOR) MISC 1 Device by Does not apply route every 14 (fourteen) days. Apply 1 sensor on upper arm every 14 days for continuous glucose monitoring   FARXIGA 10 MG TABS tablet TAKE 1 TABLET BY MOUTH EVERY DAY BEFORE BREAKFAST   fluticasone (FLONASE) 50 MCG/ACT nasal spray Place 1 spray into both nostrils daily as needed for allergies or rhinitis.   fluticasone (FLOVENT HFA) 110 MCG/ACT inhaler Flovent HFA 110 mcg/actuation aerosol inhaler   glucose blood (ACCU-CHEK GUIDE) test strip Use to check blood sugar twice daily.   ibuprofen (ADVIL) 600 MG tablet Take 600 mg by mouth every 6 (six) hours.   insulin aspart protamine - aspart (NOVOLOG MIX 70/30 FLEXPEN) (70-30) 100 UNIT/ML FlexPen INJECT 18 UNITS INTO THE SKIN 2 (TWO) TIMES DAILY WITH A MEAL.   insulin lispro protamine-lispro (HUMALOG MIX 75/25) (75-25) 100 UNIT/ML SUSP injection Inject 18 Units into the skin 2 (two) times daily with a meal. (Patient taking differently: Inject 18 Units into the skin 2 (two) times daily with a meal. 14 units in am and 16 at night)   Insulin Pen Needle (B-D UF III MINI PEN NEEDLES) 31G X 5 MM MISC 1 EACH BY DOES NOT APPLY ROUTE 2 (TWO) TIMES DAILY. USE TO INJECT INSULIN TWICE DAILY.   metFORMIN (GLUCOPHAGE-XR) 500 MG 24 hr tablet Take 2 tablets (1,000 mg total) by mouth daily.   Naproxen Sod-diphenhydrAMINE (ALEVE PM) 220-25 MG TABS 2 tablets Orally at bedtime   OZEMPIC, 1 MG/DOSE, 4 MG/3ML SOPN INJECT 1MG   SUBCUTANEOUSLY  WEEKLY (EVERY 7 DAYS)   ramipril (ALTACE) 10 MG capsule Take 10 mg by mouth daily.   rosuvastatin (CRESTOR) 20 MG tablet Take 1 tablet (20 mg total) by mouth at bedtime.   topiramate (TOPAMAX) 25 MG tablet    [DISCONTINUED] atorvastatin (LIPITOR) 10 MG tablet Take 1 tablet (10 mg total) by mouth daily at 6 PM.     PAST MEDICAL HISTORY: Past Medical History:  Diagnosis Date   Anemia    Asthma    Diabetes mellitus    Family history of adverse  reaction to anesthesia    Brother   Heart murmur    Hyperlipidemia    Hypertension    Pancreatitis    Pneumonia     PAST SURGICAL HISTORY: Past Surgical History:  Procedure Laterality Date   ABDOMINAL HYSTERECTOMY     ANTERIOR LAT LUMBAR FUSION N/A 07/07/2019   Procedure: ANTERIOR LATERAL LUMBAR FUSION (XLIF) LUMBAR TWO THROUGH LUMBAR THREE, POSTERIOR SPINAL FUSION INTERBODY LUMBAR TWO THROUGH LUMBAR THREE;  Surgeon: Venita Lick, MD;  Location: [facility] OR;  Service: Orthopedics;  Laterality: N/A;  4 HRS   CESAREAN SECTION     CHOLECYSTECTOMY     GANGLION CYST EXCISION     HIP CAPSULECTOMY Right 10/14/2018   Procedure: RIGHT HIP HETEROTOPIC OSSIFICATION RESECTION;  Surgeon: Roby Lofts, MD;  Location: [facility] OR;  Service: Orthopedics;  Laterality: Right;   LAPAROSCOPY     LUMBAR FUSION      FAMILY HISTORY: The patient family history includes Diabetes in her maternal aunt, maternal grandmother, mother, and paternal aunt; Glaucoma in her brother and mother; Hypertension in her brother and mother; Melanoma in her brother; Stroke in her mother.  SOCIAL HISTORY:  The patient  reports that she has been smoking cigars. She has never used smokeless tobacco. She reports current alcohol use. She reports that she does not use drugs.  REVIEW OF SYSTEMS: Review of Systems  Cardiovascular:  Negative for chest pain, claudication, dyspnea on exertion, irregular heartbeat, leg swelling, near-syncope, orthopnea, palpitations, paroxysmal nocturnal dyspnea and syncope.  Respiratory:  Negative for shortness of breath.   Hematologic/Lymphatic: Negative for bleeding problem.  Musculoskeletal:  Negative for muscle cramps and myalgias.  Neurological:  Negative for dizziness and light-headedness.    PHYSICAL EXAM:    05/22/2022    9:17 AM 04/08/2022    8:45 AM 03/13/2022    9:32 AM  Vitals with BMI  Height 5\' 2"  5\' 2"  5\' 2"   Weight 167 lbs 161 lbs 10 oz 164 lbs  BMI 30.54 29.55 29.99  Systolic 148  143 120  Diastolic 75 76 78  Pulse 99 93 87    Physical Exam  Constitutional: No distress.  Age appropriate, hemodynamically stable.   Neck: No JVD present.  Cardiovascular: Normal rate, regular rhythm, S1 normal, S2 normal, intact distal pulses and normal pulses. Exam reveals no gallop, no S3 and no S4.  Murmur heard. Low-pitched rumbling crescendo diastolic murmur is present with a grade of 3/4 at the apex. Pulses:      Carotid pulses are  on the right side with bruit. Pulmonary/Chest: Effort normal and breath sounds normal. No stridor. She has no wheezes. She has no rales.  Abdominal: Soft. Bowel sounds are normal. She exhibits no distension. There is no abdominal tenderness.  Musculoskeletal:        General: No edema.     Cervical back: Neck supple.  Neurological: She is alert and oriented to person, place, and time.  She has intact cranial nerves (2-12).  Skin: Skin is warm and moist.   CARDIAC DATABASE: EKG: 04/08/2022: Sinus rhythm, 88 bpm, normal axis, biatrial enlargement, borderline first-degree AV block, poor R wave progression, without underlying ischemia or injury  Echocardiogram: 04/06/2022:  Normal LV systolic function with visual EF 60-65%. Left ventricle cavity is normal in size. Mild concentric hypertrophy of the left ventricle. Normal global wall motion. Doppler evidence of grade II (pseudonormal) diastolic dysfunction, elevated LAP. Calculated EF 61%. Left atrial cavity is moderately dilated at 46.2 ml/m^2. An atrial septal aneurysm without a patent foramen ovale is present. Structurally normal trileaflet aortic valve. Mild (Grade I) aortic regurgitation. Trace mitral regurgitation. Mild calcification of the mitral valve annulus. Moderately restricted mitral valve leaflets. Moderate mitral valve stenosis. E-wave dominant mitral inflow. Structurally normal tricuspid valve with trace regurgitation. No evidence of pulmonary hypertension. No prior available for  comparison.    Stress Testing: No results found for this or any previous visit from the past 1095 days.   Heart Catheterization: None  CT Cardiac Scoring: May 01, 2022 1. Coronary calcium score of 108. This was 95 percentile for age-, race-, and sex-matched controls.  2. Mitral annular calcification  Noncardiac findings: Aortic Atherosclerosis (ICD10-I70.0).   Carotid artery duplex 05/07/2022: No hemodynamically significant arterial disease in the right internal carotid artery. No hemodynamically significant arterial disease in the left internal carotid artery. No significant plaque noted.  Left ICA demonstrates tortuosity and may be the source of bruit.  Antegrade right vertebral artery flow. Antegrade left vertebral artery flow.   LABORATORY DATA:    Latest Ref Rng & Units 07/09/2019    5:06 AM 07/05/2019   10:15 AM 10/15/2018    3:11 AM  CBC  WBC 4.0 - 10.5 K/uL 15.2  8.0  11.2   Hemoglobin 12.0 - 15.0 g/dL 04.5  40.9  81.1   Hematocrit 36.0 - 46.0 % 39.7  45.6  35.0   Platelets 150 - 400 K/uL 190  178  PLATELET CLUMPS NOTED ON SMEAR, UNABLE TO ESTIMATE        Latest Ref Rng & Units 03/11/2022    9:27 AM 11/05/2021    8:39 AM 07/02/2021    8:38 AM  CMP  Glucose 70 - 99 mg/dL 914  782  61   BUN 6 - 23 mg/dL 16  16  17    Creatinine 0.40 - 1.20 mg/dL 9.56  2.13  0.86   Sodium 135 - 145 mEq/L 140  138  139   Potassium 3.5 - 5.1 mEq/L 4.1  3.7  3.8   Chloride 96 - 112 mEq/L 102  104  104   CO2 19 - 32 mEq/L 24  27  26    Calcium 8.4 - 10.5 mg/dL 57.8  9.3  9.1   Total Protein 6.0 - 8.3 g/dL  7.3    Total Bilirubin 0.2 - 1.2 mg/dL  0.6    Alkaline Phos 39 - 117 U/L  72    AST 0 - 37 U/L  18    ALT 0 - 35 U/L  13      Lipid Panel     Component Value Date/Time   CHOL 133 11/05/2021 0839   TRIG 65.0 11/05/2021 0839   HDL 54.20 11/05/2021 0839   CHOLHDL 2 11/05/2021 0839   VLDL 13.0 11/05/2021 0839   LDLCALC 66 11/05/2021 0839   LDLDIRECT 75.0 03/22/2018 1027     No components found for: "NTPROBNP" No results for input(s): "  PROBNP" in the last 8760 hours. No results for input(s): "TSH" in the last 8760 hours.  BMP Recent Labs    07/02/21 0838 11/05/21 0839 03/11/22 0927  NA 139 138 140  K 3.8 3.7 4.1  CL 104 104 102  CO2 26 27 24   GLUCOSE 61* 116* 127*  BUN 17 16 16   CREATININE 0.85 0.71 0.81  CALCIUM 9.1 9.3 10.2    HEMOGLOBIN A1C Lab Results  Component Value Date   HGBA1C 6.3 03/11/2022   MPG 139.85 07/07/2019   External Labs: Collected: 11/01/2021. Total cholesterol 133, triglycerides 65, HDL 54, LDL calculated 66, non-HDL 79   IMPRESSION:    ICD-10-CM   1. Mitral valve disease  I05.9     2. Agatston coronary artery calcium score between 100 and 400  R93.1 rosuvastatin (CRESTOR) 20 MG tablet    PCV MYOCARDIAL PERFUSION WO LEXISCAN    3. Coronary atherosclerosis due to calcified coronary lesion  I25.10 rosuvastatin (CRESTOR) 20 MG tablet   I25.84 PCV MYOCARDIAL PERFUSION WO LEXISCAN    4. Atherosclerosis of aorta (HCC)  I70.0 rosuvastatin (CRESTOR) 20 MG tablet    PCV MYOCARDIAL PERFUSION WO LEXISCAN    5. Benign hypertension  I10     6. Mixed hyperlipidemia  E78.2 rosuvastatin (CRESTOR) 20 MG tablet    PCV MYOCARDIAL PERFUSION WO LEXISCAN    7. Type 2 diabetes mellitus with hyperglycemia, with long-term current use of insulin [facility])  E11.65    Z79.4        RECOMMENDATIONS: Veronica Fletcher is a 57 y.o. African-American female whose past medical history and cardiac risk factors include: Moderate CAC (total CAC 108, 95th percentile), Aortic atherosclerosis, moderate mitral stenosis, hypertension, hyperlipidemia, insulin-dependent diabetes mellitus type 2.  Mitral valve disease Most recent echocardiogram notes moderate mitral stenosis. Reemphasized importance of blood pressure management. Will repeat echocardiogram in 1 year to evaluate disease progression  Agatston coronary artery calcium score between 100  and 400 Coronary atherosclerosis due to calcified coronary lesion Atherosclerosis of aorta (HCC) Total CAC 108, 95th percentile.  Continue aspirin.  Start rosuvastatin 20 mg p.o. nightly Reviewed with notes of the echocardiogram. Will proceed with exercise nuclear stress test to evaluate for functional capacity and reversible ischemia.  Benign hypertension Office blood pressures are within acceptable limits but not at goal. Patient states that she will keep a log of her blood pressures at home and if uptitrate medical therapy. Reemphasized importance of low-salt diet  Mixed hyperlipidemia Currently on rosuvastatin.   Has tried Lipitor-discontinued secondary to myalgias She denies myalgia or other side effects. Most recent lipids dated October 2023, independently reviewed as noted above. Given her CAC, aortic atherosclerosis, diabetes, recommended goal LDL <55 mg/dL. If patient is able to tolerate rosuvastatin she will call back for refill I will order fasting lipid profile and direct LDL prior to the next office visit.  Type 2 diabetes mellitus with hyperglycemia, with long-term current use of insulin (HCC) Reemphasized importance of glycemic control. Medications reconciled. Currently managed by primary care provider.   FINAL MEDICATION LIST END OF ENCOUNTER: Meds ordered this encounter  Medications   rosuvastatin (CRESTOR) 20 MG tablet    Sig: Take 1 tablet (20 mg total) by mouth at bedtime.    Dispense:  30 tablet    Refill:  0    Medications Discontinued During This Encounter  Medication Reason   ondansetron (ZOFRAN) 8 MG tablet    promethazine (PHENERGAN) 25 MG tablet    atorvastatin (LIPITOR) 10 MG  tablet      Current Outpatient Medications:    Accu-Chek FastClix Lancets MISC, Use Accu Chek Fastclix lancets to check blood sugar twice daily., Disp: 204 each, Rfl: 3   albuterol (VENTOLIN HFA) 108 (90 Base) MCG/ACT inhaler, Inhale 1-2 puffs into the lungs every 4 (four)  hours as needed for wheezing or shortness of breath., Disp: , Rfl:    amLODipine (NORVASC) 5 MG tablet, Take 5 mg by mouth daily., Disp: , Rfl:    aspirin EC 81 MG tablet, Take 81 mg by mouth daily. Swallow whole., Disp: , Rfl:    Blood Glucose Monitoring Suppl (ACCU-CHEK GUIDE) w/Device KIT, 1 each by Does not apply route 2 (two) times a day. Use as instructed to check blood sugar twice daily., Disp: 1 kit, Rfl: 0   Continuous Blood Gluc Sensor (FREESTYLE LIBRE 3 SENSOR) MISC, 1 Device by Does not apply route every 14 (fourteen) days. Apply 1 sensor on upper arm every 14 days for continuous glucose monitoring, Disp: 2 each, Rfl: 3   FARXIGA 10 MG TABS tablet, TAKE 1 TABLET BY MOUTH EVERY DAY BEFORE BREAKFAST, Disp: 30 tablet, Rfl: 3   fluticasone (FLONASE) 50 MCG/ACT nasal spray, Place 1 spray into both nostrils daily as needed for allergies or rhinitis., Disp: , Rfl:    fluticasone (FLOVENT HFA) 110 MCG/ACT inhaler, Flovent HFA 110 mcg/actuation aerosol inhaler, Disp: , Rfl:    glucose blood (ACCU-CHEK GUIDE) test strip, Use to check blood sugar twice daily., Disp: 200 strip, Rfl: 3   ibuprofen (ADVIL) 600 MG tablet, Take 600 mg by mouth every 6 (six) hours., Disp: , Rfl:    insulin aspart protamine - aspart (NOVOLOG MIX 70/30 FLEXPEN) (70-30) 100 UNIT/ML FlexPen, INJECT 18 UNITS INTO THE SKIN 2 (TWO) TIMES DAILY WITH A MEAL., Disp: 45 mL, Rfl: 2   insulin lispro protamine-lispro (HUMALOG MIX 75/25) (75-25) 100 UNIT/ML SUSP injection, Inject 18 Units into the skin 2 (two) times daily with a meal. (Patient taking differently: Inject 18 Units into the skin 2 (two) times daily with a meal. 14 units in am and 16 at night), Disp: 45 mL, Rfl: 1   Insulin Pen Needle (B-D UF III MINI PEN NEEDLES) 31G X 5 MM MISC, 1 EACH BY DOES NOT APPLY ROUTE 2 (TWO) TIMES DAILY. USE TO INJECT INSULIN TWICE DAILY., Disp: 100 each, Rfl: 2   metFORMIN (GLUCOPHAGE-XR) 500 MG 24 hr tablet, Take 2 tablets (1,000 mg total) by  mouth daily., Disp: 180 tablet, Rfl: 3   Naproxen Sod-diphenhydrAMINE (ALEVE PM) 220-25 MG TABS, 2 tablets Orally at bedtime, Disp: , Rfl:    OZEMPIC, 1 MG/DOSE, 4 MG/3ML SOPN, INJECT 1MG  SUBCUTANEOUSLY  WEEKLY (EVERY 7 DAYS), Disp: 9 mL, Rfl: 2   ramipril (ALTACE) 10 MG capsule, Take 10 mg by mouth daily., Disp: , Rfl:    rosuvastatin (CRESTOR) 20 MG tablet, Take 1 tablet (20 mg total) by mouth at bedtime., Disp: 30 tablet, Rfl: 0   topiramate (TOPAMAX) 25 MG tablet, , Disp: , Rfl:   Orders Placed This Encounter  Procedures   PCV MYOCARDIAL PERFUSION WO LEXISCAN    There are no Patient Instructions on file for this visit.   --Continue cardiac medications as reconciled in final medication list. --Return in about 7 weeks (around 07/10/2022) for Follow up, Coronary artery calcification, Review test results. or sooner if needed. --Continue follow-up with your primary care physician regarding the management of your other chronic comorbid conditions.  Patient's questions and concerns  were addressed to her satisfaction. She voices understanding of the instructions provided during this encounter.   This note was created using a voice recognition software as a result there may be grammatical errors inadvertently enclosed that do not reflect the nature of this encounter. Every attempt is made to correct such errors.  Tessa Lerner, [place], [facility]  Pager:  [phone] Office: [phone]

## 2022-06-03 DIAGNOSIS — D485 Neoplasm of uncertain behavior of skin: Secondary | ICD-10-CM | POA: Diagnosis not present

## 2022-06-03 DIAGNOSIS — L988 Other specified disorders of the skin and subcutaneous tissue: Secondary | ICD-10-CM | POA: Diagnosis not present

## 2022-06-05 ENCOUNTER — Ambulatory Visit: Payer: BC Managed Care – PPO

## 2022-06-05 DIAGNOSIS — I700 Atherosclerosis of aorta: Secondary | ICD-10-CM

## 2022-06-05 DIAGNOSIS — R931 Abnormal findings on diagnostic imaging of heart and coronary circulation: Secondary | ICD-10-CM | POA: Diagnosis not present

## 2022-06-05 DIAGNOSIS — I2510 Atherosclerotic heart disease of native coronary artery without angina pectoris: Secondary | ICD-10-CM | POA: Diagnosis not present

## 2022-06-05 DIAGNOSIS — E782 Mixed hyperlipidemia: Secondary | ICD-10-CM | POA: Diagnosis not present

## 2022-06-10 DIAGNOSIS — H40053 Ocular hypertension, bilateral: Secondary | ICD-10-CM | POA: Diagnosis not present

## 2022-06-13 ENCOUNTER — Other Ambulatory Visit: Payer: Self-pay | Admitting: Cardiology

## 2022-06-13 DIAGNOSIS — R931 Abnormal findings on diagnostic imaging of heart and coronary circulation: Secondary | ICD-10-CM

## 2022-06-13 DIAGNOSIS — I700 Atherosclerosis of aorta: Secondary | ICD-10-CM

## 2022-06-13 DIAGNOSIS — E782 Mixed hyperlipidemia: Secondary | ICD-10-CM

## 2022-06-13 DIAGNOSIS — I2510 Atherosclerotic heart disease of native coronary artery without angina pectoris: Secondary | ICD-10-CM

## 2022-07-09 DIAGNOSIS — Z23 Encounter for immunization: Secondary | ICD-10-CM | POA: Diagnosis not present

## 2022-07-10 ENCOUNTER — Ambulatory Visit: Payer: BC Managed Care – PPO | Admitting: Cardiology

## 2022-07-15 ENCOUNTER — Encounter: Payer: Self-pay | Admitting: Cardiology

## 2022-07-15 ENCOUNTER — Ambulatory Visit: Payer: BC Managed Care – PPO | Admitting: Cardiology

## 2022-07-15 VITALS — BP 108/75 | HR 99 | Resp 16 | Ht 62.0 in | Wt 159.8 lb

## 2022-07-15 DIAGNOSIS — I059 Rheumatic mitral valve disease, unspecified: Secondary | ICD-10-CM

## 2022-07-15 DIAGNOSIS — I10 Essential (primary) hypertension: Secondary | ICD-10-CM

## 2022-07-15 DIAGNOSIS — I700 Atherosclerosis of aorta: Secondary | ICD-10-CM

## 2022-07-15 DIAGNOSIS — R931 Abnormal findings on diagnostic imaging of heart and coronary circulation: Secondary | ICD-10-CM | POA: Diagnosis not present

## 2022-07-15 DIAGNOSIS — E1165 Type 2 diabetes mellitus with hyperglycemia: Secondary | ICD-10-CM

## 2022-07-15 DIAGNOSIS — E1169 Type 2 diabetes mellitus with other specified complication: Secondary | ICD-10-CM

## 2022-07-15 DIAGNOSIS — I2510 Atherosclerotic heart disease of native coronary artery without angina pectoris: Secondary | ICD-10-CM | POA: Diagnosis not present

## 2022-07-15 DIAGNOSIS — E782 Mixed hyperlipidemia: Secondary | ICD-10-CM

## 2022-07-15 NOTE — Progress Notes (Signed)
ID:  Veronica Fletcher, DOB 07/24/1965, MRN [id number]  PCP:  Daisy Floro, MD  Cardiologist:  Tessa Lerner, DO, [facility] (established care 04/08/2022)  Date: 07/15/22 Last Office Visit: 05/22/2022  Chief Complaint  Patient presents with   coronary artery calcification    HPI  Veronica Fletcher is a 57 y.o. African-American female whose past medical history and cardiovascular risk factors include: Moderate CAC (total CAC 108, 95th percentile), Aortic atherosclerosis, moderate mitral stenosis, hypertension, hyperlipidemia, insulin-dependent diabetes mellitus type 2.  Patient is referred to the practice for evaluation of a cardiac murmur.  Since last office visit she had an echocardiogram which noted moderate mitral valve stenosis with moderately dilated left atrium, grade 2 diastolic dysfunction, elevated left atrial pressures.  She also had a coronary calcium score which noted moderate CAC placing her at the 95th percentile.  At last office visit the shared decision was to start rosuvastatin and proceed with stress test to evaluate for any reversible ischemia.  Her most recent myocardial perfusion imaging was reported to be low risk.  She is tolerating Crestor well without any side effects.  She forgot to get lipids done as originally planned before today's visit.  She has upcoming labs with Dr. Lucianne Muss on 07/22/2022 and she will send [place] a copy for reference.  Patient states that she is been running around all day, working outside in the yard, which may be contributing to his soft blood pressures.  In addition, she has lost approximately 30 pounds since initiation of Ozempic.  FUNCTIONAL STATUS: Walks her dog daily - 30-45 min / day.    ALLERGIES: Allergies  Allergen Reactions   Liraglutide Other (See Comments)    Other reaction(s): Pancreatitis  *Victoza     MEDICATION LIST PRIOR TO VISIT: Current Meds  Medication Sig   Accu-Chek FastClix Lancets MISC Use Accu Chek Fastclix lancets to  check blood sugar twice daily.   albuterol (VENTOLIN HFA) 108 (90 Base) MCG/ACT inhaler Inhale 1-2 puffs into the lungs every 4 (four) hours as needed for wheezing or shortness of breath.   amLODipine (NORVASC) 5 MG tablet Take 5 mg by mouth daily. Hold if SBP<171mmHG   aspirin EC 81 MG tablet Take 81 mg by mouth daily. Swallow whole.   Blood Glucose Monitoring Suppl (ACCU-CHEK GUIDE) w/Device KIT 1 each by Does not apply route 2 (two) times a day. Use as instructed to check blood sugar twice daily.   Continuous Blood Gluc Sensor (FREESTYLE LIBRE 3 SENSOR) MISC 1 Device by Does not apply route every 14 (fourteen) days. Apply 1 sensor on upper arm every 14 days for continuous glucose monitoring   FARXIGA 10 MG TABS tablet TAKE 1 TABLET BY MOUTH EVERY DAY BEFORE BREAKFAST   fluticasone (FLONASE) 50 MCG/ACT nasal spray Place 1 spray into both nostrils daily as needed for allergies or rhinitis.   fluticasone (FLOVENT HFA) 110 MCG/ACT inhaler Flovent HFA 110 mcg/actuation aerosol inhaler   glucose blood (ACCU-CHEK GUIDE) test strip Use to check blood sugar twice daily.   ibuprofen (ADVIL) 600 MG tablet Take 600 mg by mouth every 6 (six) hours.   insulin aspart protamine - aspart (NOVOLOG MIX 70/30 FLEXPEN) (70-30) 100 UNIT/ML FlexPen INJECT 18 UNITS INTO THE SKIN 2 (TWO) TIMES DAILY WITH A MEAL.   insulin lispro protamine-lispro (HUMALOG MIX 75/25) (75-25) 100 UNIT/ML SUSP injection Inject 18 Units into the skin 2 (two) times daily with a meal. (Patient taking differently: Inject 18 Units into the skin 2 (two) times  daily with a meal. 14 units in am and 16 at night)   Insulin Pen Needle (B-D UF III MINI PEN NEEDLES) 31G X 5 MM MISC 1 EACH BY DOES NOT APPLY ROUTE 2 (TWO) TIMES DAILY. USE TO INJECT INSULIN TWICE DAILY.   metFORMIN (GLUCOPHAGE-XR) 500 MG 24 hr tablet Take 2 tablets (1,000 mg total) by mouth daily.   Naproxen Sod-diphenhydrAMINE (ALEVE PM) 220-25 MG TABS 2 tablets Orally at bedtime   OZEMPIC,  1 MG/DOSE, 4 MG/3ML SOPN INJECT 1MG  SUBCUTANEOUSLY  WEEKLY (EVERY 7 DAYS)   ramipril (ALTACE) 10 MG capsule Take 10 mg by mouth daily.   rosuvastatin (CRESTOR) 20 MG tablet TAKE 1 TABLET BY MOUTH EVERYDAY AT BEDTIME   topiramate (TOPAMAX) 25 MG tablet      PAST MEDICAL HISTORY: Past Medical History:  Diagnosis Date   Anemia    Asthma    Diabetes mellitus    Family history of adverse reaction to anesthesia    Brother   Heart murmur    Hyperlipidemia    Hypertension    Pancreatitis    Pneumonia     PAST SURGICAL HISTORY: Past Surgical History:  Procedure Laterality Date   ABDOMINAL HYSTERECTOMY     ANTERIOR LAT LUMBAR FUSION N/A 07/07/2019   Procedure: ANTERIOR LATERAL LUMBAR FUSION (XLIF) LUMBAR TWO THROUGH LUMBAR THREE, POSTERIOR SPINAL FUSION INTERBODY LUMBAR TWO THROUGH LUMBAR THREE;  Surgeon: Venita Lick, MD;  Location: [facility] OR;  Service: Orthopedics;  Laterality: N/A;  4 HRS   CESAREAN SECTION     CHOLECYSTECTOMY     GANGLION CYST EXCISION     HIP CAPSULECTOMY Right 10/14/2018   Procedure: RIGHT HIP HETEROTOPIC OSSIFICATION RESECTION;  Surgeon: Roby Lofts, MD;  Location: [facility] OR;  Service: Orthopedics;  Laterality: Right;   LAPAROSCOPY     LUMBAR FUSION      FAMILY HISTORY: The patient family history includes Diabetes in her maternal aunt, maternal grandmother, mother, and paternal aunt; Glaucoma in her brother and mother; Hypertension in her brother and mother; Melanoma in her brother; Stroke in her mother.  SOCIAL HISTORY:  The patient  reports that she has been smoking cigars. She has never used smokeless tobacco. She reports current alcohol use. She reports that she does not use drugs.  REVIEW OF SYSTEMS: Review of Systems  Cardiovascular:  Negative for chest pain, claudication, dyspnea on exertion, irregular heartbeat, leg swelling, near-syncope, orthopnea, palpitations, paroxysmal nocturnal dyspnea and syncope.  Respiratory:  Negative for shortness of  breath.   Hematologic/Lymphatic: Negative for bleeding problem.  Musculoskeletal:  Negative for muscle cramps and myalgias.  Neurological:  Negative for dizziness and light-headedness.    PHYSICAL EXAM:    07/15/2022    1:35 PM 05/22/2022    9:17 AM 04/08/2022    8:45 AM  Vitals with BMI  Height 5\' 2"  5\' 2"  5\' 2"   Weight 159 lbs 13 oz 167 lbs 161 lbs 10 oz  BMI 29.22 30.54 29.55  Systolic 108 148 161  Diastolic 75 75 76  Pulse 99 99 93    Physical Exam  Constitutional: No distress.  Age appropriate, hemodynamically stable.   Neck: No JVD present.  Cardiovascular: Normal rate, regular rhythm, S1 normal, S2 normal, intact distal pulses and normal pulses. Exam reveals no gallop, no S3 and no S4.  Murmur heard. Low-pitched rumbling crescendo diastolic murmur is present with a grade of 3/4 at the apex. Pulses:      Carotid pulses are  on the right side  with bruit. Pulmonary/Chest: Effort normal and breath sounds normal. No stridor. She has no wheezes. She has no rales.  Abdominal: Soft. Bowel sounds are normal. She exhibits no distension. There is no abdominal tenderness.  Musculoskeletal:        General: No edema.     Cervical back: Neck supple.  Neurological: She is alert and oriented to person, place, and time. She has intact cranial nerves (2-12).  Skin: Skin is warm and moist.   CARDIAC DATABASE: EKG: 04/08/2022: Sinus rhythm, 88 bpm, normal axis, biatrial enlargement, borderline first-degree AV block, poor R wave progression, without underlying ischemia or injury  Echocardiogram: 04/06/2022:  Normal LV systolic function with visual EF 60-65%. Left ventricle cavity is normal in size. Mild concentric hypertrophy of the left ventricle. Normal global wall motion. Doppler evidence of grade II (pseudonormal) diastolic dysfunction, elevated LAP. Calculated EF 61%. Left atrial cavity is moderately dilated at 46.2 ml/m^2. An atrial septal aneurysm without a patent foramen ovale is  present. Structurally normal trileaflet aortic valve. Mild (Grade I) aortic regurgitation. Trace mitral regurgitation. Mild calcification of the mitral valve annulus. Moderately restricted mitral valve leaflets. Moderate mitral valve stenosis. E-wave dominant mitral inflow. Structurally normal tricuspid valve with trace regurgitation. No evidence of pulmonary hypertension. No prior available for comparison.    Stress Testing: Exercise nuclear stress test 06/05/2022 Myocardial perfusion is normal. Overall LV systolic function is normal without regional wall motion abnormalities. Stress LV EF: 72%. Low risk study. Normal ECG stress. The patient exercised for 5 minutes and 30 seconds of a Bruce protocol, achieving approximately 7.05 METs and 90% MPHR. Peak EKG/ECG demonstrated sinus tachycardia. The heart rate response was normal. The blood pressure response was normal. No previous exam available for comparison.    Heart Catheterization: None  CT Cardiac Scoring: May 01, 2022 1. Coronary calcium score of 108. This was 95 percentile for age-, race-, and sex-matched controls.  2. Mitral annular calcification  Noncardiac findings: Aortic Atherosclerosis (ICD10-I70.0).   Carotid artery duplex 05/07/2022: No hemodynamically significant arterial disease in the right internal carotid artery. No hemodynamically significant arterial disease in the left internal carotid artery. No significant plaque noted.  Left ICA demonstrates tortuosity and may be the source of bruit.  Antegrade right vertebral artery flow. Antegrade left vertebral artery flow.   LABORATORY DATA:    Latest Ref Rng & Units 07/09/2019    5:06 AM 07/05/2019   10:15 AM 10/15/2018    3:11 AM  CBC  WBC 4.0 - 10.5 K/uL 15.2  8.0  11.2   Hemoglobin 12.0 - 15.0 g/dL 69.6  29.5  28.4   Hematocrit 36.0 - 46.0 % 39.7  45.6  35.0   Platelets 150 - 400 K/uL 190  178  PLATELET CLUMPS NOTED ON SMEAR, UNABLE TO ESTIMATE         Latest Ref Rng & Units 03/11/2022    9:27 AM 11/05/2021    8:39 AM 07/02/2021    8:38 AM  CMP  Glucose 70 - 99 mg/dL 132  440  61   BUN 6 - 23 mg/dL 16  16  17    Creatinine 0.40 - 1.20 mg/dL 1.02  7.25  3.66   Sodium 135 - 145 mEq/L 140  138  139   Potassium 3.5 - 5.1 mEq/L 4.1  3.7  3.8   Chloride 96 - 112 mEq/L 102  104  104   CO2 19 - 32 mEq/L 24  27  26    Calcium 8.4 -  10.5 mg/dL 16.1  9.3  9.1   Total Protein 6.0 - 8.3 g/dL  7.3    Total Bilirubin 0.2 - 1.2 mg/dL  0.6    Alkaline Phos 39 - 117 U/L  72    AST 0 - 37 U/L  18    ALT 0 - 35 U/L  13      Lipid Panel     Component Value Date/Time   CHOL 133 11/05/2021 0839   TRIG 65.0 11/05/2021 0839   HDL 54.20 11/05/2021 0839   CHOLHDL 2 11/05/2021 0839   VLDL 13.0 11/05/2021 0839   LDLCALC 66 11/05/2021 0839   LDLDIRECT 75.0 03/22/2018 1027    No components found for: "NTPROBNP" No results for input(s): "PROBNP" in the last 8760 hours. No results for input(s): "TSH" in the last 8760 hours.  BMP Recent Labs    11/05/21 0839 03/11/22 0927  NA 138 140  K 3.7 4.1  CL 104 102  CO2 27 24  GLUCOSE 116* 127*  BUN 16 16  CREATININE 0.71 0.81  CALCIUM 9.3 10.2    HEMOGLOBIN A1C Lab Results  Component Value Date   HGBA1C 6.3 03/11/2022   MPG 139.85 07/07/2019   External Labs: Collected: 11/01/2021. Total cholesterol 133, triglycerides 65, HDL 54, LDL calculated 66, non-HDL 79   IMPRESSION:    ICD-10-CM   1. Mitral valve disease  I05.9 PCV ECHOCARDIOGRAM COMPLETE    2. Agatston coronary artery calcium score between 100 and 400  R93.1     3. Coronary atherosclerosis due to calcified coronary lesion  I25.10    I25.84     4. Atherosclerosis of aorta (HCC)  I70.0     5. Benign hypertension  I10     6. Mixed hyperlipidemia  E78.2     7. Type 2 diabetes mellitus with hyperglycemia, with long-term current use of insulin (HCC)  E11.65    Z79.4     8. Type 2 diabetes mellitus with hyperlipidemia [facility])   E11.69    E78.5        RECOMMENDATIONS: Areon Kammer is a 57 y.o. African-American female whose past medical history and cardiac risk factors include: Moderate CAC (total CAC 108, 95th percentile), Aortic atherosclerosis, moderate mitral stenosis, hypertension, hyperlipidemia, insulin-dependent diabetes mellitus type 2.  Mitral valve disease Noted to have mitral stenosis on prior study.  Currently asymptomatic. Will repeat echo in April 2025. Follow-up thereafter  Agatston coronary artery calcium score between 100 and 400 Coronary atherosclerosis due to calcified coronary lesion Atherosclerosis of aorta (HCC) Total CAC 108, 95th percentile.  Continue aspirin.  Continue rosuvastatin 20 mg p.o. nightly Reviewed with notes of the echocardiogram. MPI results reviewed with the patient at today's office visit and noted above for further reference. Reemphasized the importance of secondary prevention with focus on improving her modifiable cardiovascular risk factors such as glycemic control, lipid management, blood pressure control, weight loss.  Benign hypertension Office blood pressures are soft. She has lost 30 pounds since initiation of Ozempic. We may need to down titrate antihypertensive medications. For today I have asked her to hold her blood pressure pills that are due later today to prevent episodes of symptomatic hypotension. I have also placed parameters on amlodipine, hold if SBP less than 100 mmHg.   Currently managed by primary care provider.  Mixed hyperlipidemia Currently on rosuvastatin.   Has tried Lipitor-discontinued secondary to myalgias She denies myalgia or other side effects. Most recent lipids dated October 2023, independently reviewed as noted  above. Given her CAC, aortic atherosclerosis, diabetes, recommended goal LDL <55 mg/dL. Will have lipids with Dr. Lucianne Muss and send [place] a copy for reference.  Recommend checking fasting lipids, direct LDL, CMP.  Type 2  diabetes mellitus with hyperglycemia, with long-term current use of insulin (HCC) Reemphasized importance of glycemic control. Medications reconciled. Currently managed by primary care provider.   FINAL MEDICATION LIST END OF ENCOUNTER: No orders of the defined types were placed in this encounter.   There are no discontinued medications.    Current Outpatient Medications:    Accu-Chek FastClix Lancets MISC, Use Accu Chek Fastclix lancets to check blood sugar twice daily., Disp: 204 each, Rfl: 3   albuterol (VENTOLIN HFA) 108 (90 Base) MCG/ACT inhaler, Inhale 1-2 puffs into the lungs every 4 (four) hours as needed for wheezing or shortness of breath., Disp: , Rfl:    amLODipine (NORVASC) 5 MG tablet, Take 5 mg by mouth daily. Hold if SBP<168mmHG, Disp: , Rfl:    aspirin EC 81 MG tablet, Take 81 mg by mouth daily. Swallow whole., Disp: , Rfl:    Blood Glucose Monitoring Suppl (ACCU-CHEK GUIDE) w/Device KIT, 1 each by Does not apply route 2 (two) times a day. Use as instructed to check blood sugar twice daily., Disp: 1 kit, Rfl: 0   Continuous Blood Gluc Sensor (FREESTYLE LIBRE 3 SENSOR) MISC, 1 Device by Does not apply route every 14 (fourteen) days. Apply 1 sensor on upper arm every 14 days for continuous glucose monitoring, Disp: 2 each, Rfl: 3   FARXIGA 10 MG TABS tablet, TAKE 1 TABLET BY MOUTH EVERY DAY BEFORE BREAKFAST, Disp: 30 tablet, Rfl: 3   fluticasone (FLONASE) 50 MCG/ACT nasal spray, Place 1 spray into both nostrils daily as needed for allergies or rhinitis., Disp: , Rfl:    fluticasone (FLOVENT HFA) 110 MCG/ACT inhaler, Flovent HFA 110 mcg/actuation aerosol inhaler, Disp: , Rfl:    glucose blood (ACCU-CHEK GUIDE) test strip, Use to check blood sugar twice daily., Disp: 200 strip, Rfl: 3   ibuprofen (ADVIL) 600 MG tablet, Take 600 mg by mouth every 6 (six) hours., Disp: , Rfl:    insulin aspart protamine - aspart (NOVOLOG MIX 70/30 FLEXPEN) (70-30) 100 UNIT/ML FlexPen, INJECT 18  UNITS INTO THE SKIN 2 (TWO) TIMES DAILY WITH A MEAL., Disp: 45 mL, Rfl: 2   insulin lispro protamine-lispro (HUMALOG MIX 75/25) (75-25) 100 UNIT/ML SUSP injection, Inject 18 Units into the skin 2 (two) times daily with a meal. (Patient taking differently: Inject 18 Units into the skin 2 (two) times daily with a meal. 14 units in am and 16 at night), Disp: 45 mL, Rfl: 1   Insulin Pen Needle (B-D UF III MINI PEN NEEDLES) 31G X 5 MM MISC, 1 EACH BY DOES NOT APPLY ROUTE 2 (TWO) TIMES DAILY. USE TO INJECT INSULIN TWICE DAILY., Disp: 100 each, Rfl: 2   metFORMIN (GLUCOPHAGE-XR) 500 MG 24 hr tablet, Take 2 tablets (1,000 mg total) by mouth daily., Disp: 180 tablet, Rfl: 3   Naproxen Sod-diphenhydrAMINE (ALEVE PM) 220-25 MG TABS, 2 tablets Orally at bedtime, Disp: , Rfl:    OZEMPIC, 1 MG/DOSE, 4 MG/3ML SOPN, INJECT 1MG  SUBCUTANEOUSLY  WEEKLY (EVERY 7 DAYS), Disp: 9 mL, Rfl: 2   ramipril (ALTACE) 10 MG capsule, Take 10 mg by mouth daily., Disp: , Rfl:    rosuvastatin (CRESTOR) 20 MG tablet, TAKE 1 TABLET BY MOUTH EVERYDAY AT BEDTIME, Disp: 90 tablet, Rfl: 3   topiramate (TOPAMAX) 25 MG tablet, ,  Disp: , Rfl:   Orders Placed This Encounter  Procedures   PCV ECHOCARDIOGRAM COMPLETE    There are no Patient Instructions on file for this visit.   --Continue cardiac medications as reconciled in final medication list. --Return in about 10 months (around 05/14/2023) for Follow up. or sooner if needed. --Continue follow-up with your primary care physician regarding the management of your other chronic comorbid conditions.  Patient's questions and concerns were addressed to her satisfaction. She voices understanding of the instructions provided during this encounter.   This note was created using a voice recognition software as a result there may be grammatical errors inadvertently enclosed that do not reflect the nature of this encounter. Every attempt is made to correct such errors.  Tessa Lerner, [place],  [facility]  Pager:  [phone] Office: [phone]

## 2022-07-22 ENCOUNTER — Other Ambulatory Visit (INDEPENDENT_AMBULATORY_CARE_PROVIDER_SITE_OTHER): Payer: BC Managed Care – PPO

## 2022-07-22 DIAGNOSIS — E7800 Pure hypercholesterolemia, unspecified: Secondary | ICD-10-CM | POA: Diagnosis not present

## 2022-07-22 DIAGNOSIS — E1165 Type 2 diabetes mellitus with hyperglycemia: Secondary | ICD-10-CM

## 2022-07-22 DIAGNOSIS — Z794 Long term (current) use of insulin: Secondary | ICD-10-CM | POA: Diagnosis not present

## 2022-07-22 LAB — COMPREHENSIVE METABOLIC PANEL
ALT: 9 U/L (ref 0–35)
AST: 12 U/L (ref 0–37)
Albumin: 4.3 g/dL (ref 3.5–5.2)
Alkaline Phosphatase: 83 U/L (ref 39–117)
BUN: 16 mg/dL (ref 6–23)
CO2: 29 mEq/L (ref 19–32)
Calcium: 9.6 mg/dL (ref 8.4–10.5)
Chloride: 104 mEq/L (ref 96–112)
Creatinine, Ser: 0.72 mg/dL (ref 0.40–1.20)
GFR: 93.16 mL/min (ref >60.00)
Glucose, Bld: 78 mg/dL (ref 70–99)
Potassium: 3.7 mEq/L (ref 3.5–5.1)
Sodium: 142 mEq/L (ref 135–145)
Total Bilirubin: 0.7 mg/dL (ref 0.2–1.2)
Total Protein: 7 g/dL (ref 6.0–8.3)

## 2022-07-22 LAB — LIPID PANEL
Cholesterol: 141 mg/dL (ref 0–200)
HDL: 53.1 mg/dL (ref >39.00)
LDL Cholesterol: 75 mg/dL (ref 0–99)
NonHDL: 87.64
Total CHOL/HDL Ratio: 3
Triglycerides: 64 mg/dL (ref 0.0–149.0)
VLDL: 12.8 mg/dL (ref 0.0–40.0)

## 2022-07-22 LAB — HEMOGLOBIN A1C: Hgb A1c MFr Bld: 6.8 % — ABNORMAL HIGH (ref 4.6–6.5)

## 2022-07-24 ENCOUNTER — Encounter: Payer: Self-pay | Admitting: Endocrinology

## 2022-07-24 ENCOUNTER — Ambulatory Visit: Payer: BC Managed Care – PPO | Admitting: Endocrinology

## 2022-07-24 VITALS — BP 125/65 | HR 88 | Ht 62.0 in | Wt 158.8 lb

## 2022-07-24 DIAGNOSIS — E1165 Type 2 diabetes mellitus with hyperglycemia: Secondary | ICD-10-CM | POA: Diagnosis not present

## 2022-07-24 DIAGNOSIS — Z794 Long term (current) use of insulin: Secondary | ICD-10-CM

## 2022-07-24 DIAGNOSIS — E7800 Pure hypercholesterolemia, unspecified: Secondary | ICD-10-CM

## 2022-07-24 NOTE — Progress Notes (Signed)
Patient ID: Veronica Fletcher, female   DOB: Jun 28, 1965, 57 y.o.   MRN: [id number]           Reason for Appointment: Follow-up for Type 2 Diabetes  Referring physician: Tenny Craw  History of Present Illness:          Date of diagnosis of type 2 diabetes mellitus: 2005       Background history:  She had been started on metformin initially and not clear what other treatment she had taken subsequently, records are being awaited today She apparently was doing very well with a combination of Victoza and metformin until 2015 and she thinks she was losing weight with this.  She had taken Victoza for several months at least However according to his lab records her best A1c in 2014 was only 7.9 Apparently she was taken off Victoza when she was admitted for abdominal pain presumed to be from pancreatitis in 01/2013 She was then started on premixed insulin and taken off metformin also on discharge. She was referred here for poorly controlled diabetes with A1c consistently around 10% On her initial consultation she was given metformin and Jardiance in addition to her insulin  V-go pump 20 unit basal and boluses 4 units previously stopped  Recent history:   INSULIN regimen is: Humalog Mix at meals: 18 a.m.-14 before dinner   Non-insulin hypoglycemic drugs: Farxiga 10 mg daily, metformin ER 1000 mg daily Ozempic 1.0 mg weekly  Her A1c is 6.8, previously was at 6.3  Current management, blood sugar patterns and problems identified:  She has started using the freestyle libre sensor and blood sugars look excellent overall with this Unclear why her A1c is higher even though her blood sugars at home are excellent without much variability Blood sugars appear to be lower overnight especially in the early part but she is usually not symptomatic at that time Most of her postprandial readings are still well-controlled with only a couple of spikes over 180 Last evening she took her insulin late after the meal and  this caused a high reading with dinner Does not compare her libre readings to fingersticks She is generally trying to keep her portions small and has lost some more weight  Because of back issues she has not walked regularly  Lab glucose was 78 fasting   Side effects from medications have been:?  Victoza caused pancreatitis  Compliance with the medical regimen: Fair   CONTINUOUS GLUCOSE MONITORING RECORD INTERPRETATION    Dates of Recording: Last 2 weeks  Sensor description: Libre 3  Results statistics:   CGM use % of time   Average and SD 129  Time in range        93%  % Time Above 180 5  % Time above 250   % Time Below target 2    PRE-MEAL Fasting Lunch Dinner Bedtime Overall  Glucose range:       Mean/median: 112       POST-MEAL PC Breakfast PC Lunch PC Dinner  Glucose range:     Mean/median: 127 139 139    Glycemic patterns summary: Blood sugars are generally very stable minimal variability overall with infrequent episodes of high and low sugars above or below target  Hyperglycemic episodes are seen sporadically during the day at all different times but not consistently  Hypoglycemic episodes occurred a few times overnight mostly before 4 AM  Overnight periods: Blood sugars are mildly variable but generally averaging in the low 100 range, hypoglycemia occurred on about  3 occasions early part of the night mostly and relatively prolonged only once  Preprandial periods: Blood sugars are generally around 130 average  Postprandial periods:   After breakfast:   On an average blood sugars are not rising much with only occasional episodes of hyperglycemia late morning After lunch:   Usually blood sugars are very well-controlled and not rising excessively After dinner: Glucose readings are in the range consistently with only 1 or 2 episodes above 180 and no postprandial hypoglycemia  Previous data:  PRE-MEAL Fasting Lunch Dinner Bedtime Overall  Glucose range:  89-131 152     Mean/median:         :Self-care: The diet that the patient has been following is: tries to limit portions.      Typical meal intake: Breakfast is oatmeal/yogurt or egg/oatmeal, lunch is a grilled chicken, evening meal is meat and 2 vegetables, will have snacks on yogurt    Dinner at 8 pm           Dietician visit, most recent:2014                Weight history:   Wt Readings from Last 3 Encounters:  07/24/22 158 lb 12.8 oz (72 kg)  07/15/22 159 lb 12.8 oz (72.5 kg)  05/22/22 167 lb (75.8 kg)    Glycemic control:   Lab Results  Component Value Date   HGBA1C 6.8 (H) 07/22/2022   HGBA1C 6.3 03/11/2022   HGBA1C 6.6 (H) 11/05/2021   Lab Results  Component Value Date   MICROALBUR 0.7 03/11/2022   LDLCALC 75 07/22/2022   CREATININE 0.72 07/22/2022   Lab Results  Component Value Date   MICRALBCREAT 0.7 03/11/2022    Lab Results  Component Value Date   FRUCTOSAMINE 293 (H) 03/22/2018   FRUCTOSAMINE 250 08/12/2017   FRUCTOSAMINE 290 (H) 06/16/2017   FRUCTOSAMINE 284 10/22/2016     Lab on 07/22/2022  Component Date Value Ref Range Status   Hgb A1c MFr Bld 07/22/2022 6.8 (H)  4.6 - 6.5 % Final   Glycemic Control Guidelines for People with Diabetes:Non Diabetic:  <6%Goal of Therapy: <7%Additional Action Suggested:  >8%    Sodium 07/22/2022 142  135 - 145 mEq/L Final   Potassium 07/22/2022 3.7  3.5 - 5.1 mEq/L Final   Chloride 07/22/2022 104  96 - 112 mEq/L Final   CO2 07/22/2022 29  19 - 32 mEq/L Final   Glucose, Bld 07/22/2022 78  70 - 99 mg/dL Final   BUN 16/10/9602 16  6 - 23 mg/dL Final   Creatinine, Ser 07/22/2022 0.72  0.40 - 1.20 mg/dL Final   Total Bilirubin 07/22/2022 0.7  0.2 - 1.2 mg/dL Final   Alkaline Phosphatase 07/22/2022 83  39 - 117 U/L Final   AST 07/22/2022 12  0 - 37 U/L Final   ALT 07/22/2022 9  0 - 35 U/L Final   Total Protein 07/22/2022 7.0  6.0 - 8.3 g/dL Final   Albumin 54/09/8117 4.3  3.5 - 5.2 g/dL Final   GFR 14/78/2956  93.16  >60.00 mL/min Final   Calculated using the CKD-EPI Creatinine Equation (2021)   Calcium 07/22/2022 9.6  8.4 - 10.5 mg/dL Final   Cholesterol 21/30/8657 141  0 - 200 mg/dL Final   ATP III Classification       Desirable:  < 200 mg/dL               Borderline High:  200 - 239 mg/dL  High:  > = 240 mg/dL   Triglycerides 16/10/9602 64.0  0.0 - 149.0 mg/dL Final   Normal:  <540 mg/dLBorderline High:  150 - 199 mg/dL   HDL 98/11/9145 82.95  >39.00 mg/dL Final   VLDL 62/13/0865 12.8  0.0 - 40.0 mg/dL Final   LDL Cholesterol 07/22/2022 75  0 - 99 mg/dL Final   Total CHOL/HDL Ratio 07/22/2022 3   Final                  Men          Women1/2 Average Risk     3.4          3.3Average Risk          5.0          4.42X Average Risk          9.6          7.13X Average Risk          15.0          11.0                       NonHDL 07/22/2022 87.64   Final   NOTE:  Non-HDL goal should be 30 mg/dL higher than patient's LDL goal (i.e. LDL goal of < 70 mg/dL, would have non-HDL goal of < 100 mg/dL)      Allergies as of 7/84/6962       Reactions   Liraglutide Other (See Comments)   Other reaction(s): Pancreatitis *Victoza         Medication List        Accurate as of July 24, 2022  8:24 AM. If you have any questions, ask your nurse or doctor.          Accu-Chek FastClix Lancets Misc Use Accu Chek Fastclix lancets to check blood sugar twice daily.   Accu-Chek Guide test strip Generic drug: glucose blood Use to check blood sugar twice daily.   Accu-Chek Guide w/Device Kit 1 each by Does not apply route 2 (two) times a day. Use as instructed to check blood sugar twice daily.   albuterol 108 (90 Base) MCG/ACT inhaler Commonly known as: VENTOLIN HFA Inhale 1-2 puffs into the lungs every 4 (four) hours as needed for wheezing or shortness of breath.   Aleve PM 220-25 MG Tabs Generic drug: Naproxen Sod-diphenhydrAMINE 2 tablets Orally at bedtime   amLODipine 5 MG  tablet Commonly known as: NORVASC Take 5 mg by mouth daily. Hold if SBP<130mmHG   aspirin EC 81 MG tablet Take 81 mg by mouth daily. Swallow whole.   B-D UF III MINI PEN NEEDLES 31G X 5 MM Misc Generic drug: Insulin Pen Needle 1 EACH BY DOES NOT APPLY ROUTE 2 (TWO) TIMES DAILY. USE TO INJECT INSULIN TWICE DAILY.   Farxiga 10 MG Tabs tablet Generic drug: dapagliflozin propanediol TAKE 1 TABLET BY MOUTH EVERY DAY BEFORE BREAKFAST   fluticasone 110 MCG/ACT inhaler Commonly known as: FLOVENT HFA Flovent HFA 110 mcg/actuation aerosol inhaler   fluticasone 50 MCG/ACT nasal spray Commonly known as: FLONASE Place 1 spray into both nostrils daily as needed for allergies or rhinitis.   FreeStyle Libre 3 Sensor Misc 1 Device by Does not apply route every 14 (fourteen) days. Apply 1 sensor on upper arm every 14 days for continuous glucose monitoring   HumaLOG Mix 75/25 (75-25) 100 UNIT/ML Susp injection Generic drug: insulin lispro protamine-lispro Inject 18 Units into the  skin 2 (two) times daily with a meal. What changed: additional instructions   ibuprofen 600 MG tablet Commonly known as: ADVIL Take 600 mg by mouth every 6 (six) hours.   metFORMIN 500 MG 24 hr tablet Commonly known as: GLUCOPHAGE-XR Take 2 tablets (1,000 mg total) by mouth daily.   NovoLOG Mix 70/30 FlexPen (70-30) 100 UNIT/ML FlexPen Generic drug: insulin aspart protamine - aspart INJECT 18 UNITS INTO THE SKIN 2 (TWO) TIMES DAILY WITH A MEAL. What changed: See the new instructions.   Ozempic (1 MG/DOSE) 4 MG/3ML Sopn Generic drug: Semaglutide (1 MG/DOSE) INJECT 1MG  SUBCUTANEOUSLY  WEEKLY (EVERY 7 DAYS)   ramipril 10 MG capsule Commonly known as: ALTACE Take 10 mg by mouth daily.   rosuvastatin 20 MG tablet Commonly known as: CRESTOR TAKE 1 TABLET BY MOUTH EVERYDAY AT BEDTIME   topiramate 25 MG tablet Commonly known as: TOPAMAX        Allergies:  Allergies  Allergen Reactions   Liraglutide  Other (See Comments)    Other reaction(s): Pancreatitis  *Victoza     Past Medical History:  Diagnosis Date   Anemia    Asthma    Diabetes mellitus    Family history of adverse reaction to anesthesia    Brother   Heart murmur    Hyperlipidemia    Hypertension    Pancreatitis    Pneumonia     Past Surgical History:  Procedure Laterality Date   ABDOMINAL HYSTERECTOMY     ANTERIOR LAT LUMBAR FUSION N/A 07/07/2019   Procedure: ANTERIOR LATERAL LUMBAR FUSION (XLIF) LUMBAR TWO THROUGH LUMBAR THREE, POSTERIOR SPINAL FUSION INTERBODY LUMBAR TWO THROUGH LUMBAR THREE;  Surgeon: Venita Lick, MD;  Location: [facility] OR;  Service: Orthopedics;  Laterality: N/A;  4 HRS   CESAREAN SECTION     CHOLECYSTECTOMY     GANGLION CYST EXCISION     HIP CAPSULECTOMY Right 10/14/2018   Procedure: RIGHT HIP HETEROTOPIC OSSIFICATION RESECTION;  Surgeon: Roby Lofts, MD;  Location: [facility] OR;  Service: Orthopedics;  Laterality: Right;   LAPAROSCOPY     LUMBAR FUSION      Family History  Problem Relation Age of Onset   Glaucoma Mother    Diabetes Mother    Hypertension Mother    Stroke Mother    Glaucoma Brother    Melanoma Brother    Hypertension Brother    Diabetes Maternal Aunt    Diabetes Paternal Aunt    Diabetes Maternal Grandmother    Heart disease Neg Hx     Social History:  reports that she has been smoking cigars. She has never used smokeless tobacco. She reports current alcohol use. She reports that she does not use drugs.    Review of Systems    Lipid history: on Lipitor 10 mg She is taking it regularly with following results LDL is at target   Lab Results  Component Value Date   HDL 53.10 07/22/2022   HDL 54.20 11/05/2021   HDL 51.20 03/26/2021   Lab Results  Component Value Date   LDLCALC 75 07/22/2022   LDLCALC 66 11/05/2021   LDLCALC 85 03/26/2021   Lab Results  Component Value Date   TRIG 64.0 07/22/2022   TRIG 65.0 11/05/2021   TRIG 134.0 03/26/2021   Lab  Results  Component Value Date   CHOLHDL 3 07/22/2022   CHOLHDL 2 11/05/2021   CHOLHDL 3 03/26/2021   Lab Results  Component Value Date   LDLDIRECT 75.0 03/22/2018   LDLDIRECT  101.0 08/27/2015            Hypertension:  currently on  10 mg ramipril   Hypertension is followed by PCP  Home Bp 120/80  BP Readings from Last 3 Encounters:  07/24/22 104/60  07/15/22 108/75  05/22/22 (!) 148/75   Potassium normal  Lab Results  Component Value Date   K 3.7 07/22/2022     Most recent foot exam: 6/23 Last eye exam report not available, she is going to Dr Dione Booze   Review of Systems    Physical Examination:  BP 104/60 (BP Location: Left Arm, Patient Position: Sitting)   Pulse 88   Ht 5\' 2"  (1.575 m)   Wt 158 lb 12.8 oz (72 kg)   LMP 07/09/2011   SpO2 98%   BMI 29.04 kg/m   Diabetic Foot Exam - Simple   Simple Foot Form Diabetic Foot exam was performed with the following findings: Yes   Visual Inspection No deformities, no ulcerations, no other skin breakdown bilaterally: Yes Sensation Testing Intact to touch and monofilament testing bilaterally: Yes Pulse Check Posterior Tibialis and Dorsalis pulse intact bilaterally: Yes Comments     ASSESSMENT:  Diabetes type 2, insulin-dependent  See history of present illness for detailed discussion of current diabetes management, blood sugar patterns and problems identified  She is on Ozempic, premixed insulin twice a day, Farxiga 10 mg and Metformin, Ozempic 1 mg weekly  Her A1c is 6.8 but her GMI is 6.4 on her sensor  Has continued to lose weight especially with continuing Ozempic without any side effects  Appears to be having relatively low readings overnight and may be asymptomatic while asleep Likely benefiting from using the sensor compared to fingersticks Overall still generally has good control with using premixed insulin for simplicity  Hypertension: Blood pressure is well-controlled with ramipril  alone  Encouraged her to monitor regularly at home  Lipids: Consistently controlled  PLAN:    She can use the freestyle libre only once a month to offset her cost Insulin to be taken 12 units before supper but continue 16 before breakfast Continue watching her diet Walker as much as tolerated She will try to take the insulin consistently before starting to eat If her sugars are reading unusually low she can compare with Accu-Chek to make sure it is accurate  No change in the atorvastatin  Follow-up in 4 months   There are no Patient Instructions on file for this visit.  Total visit time for evaluation management, instructions on libre use and counseling = 30 minutes    Reather Littler 07/24/2022, 8:24 AM   Note: This office note was prepared with Dragon voice recognition system technology. Any transcriptional errors that result from this process are unintentional.

## 2022-07-24 NOTE — Patient Instructions (Signed)
Take 12 U at supper

## 2022-07-29 ENCOUNTER — Encounter: Payer: Self-pay | Admitting: Endocrinology

## 2022-08-30 DIAGNOSIS — U071 COVID-19: Secondary | ICD-10-CM | POA: Diagnosis not present

## 2022-08-30 DIAGNOSIS — R52 Pain, unspecified: Secondary | ICD-10-CM | POA: Diagnosis not present

## 2022-08-30 DIAGNOSIS — R5383 Other fatigue: Secondary | ICD-10-CM | POA: Diagnosis not present

## 2022-08-30 DIAGNOSIS — R051 Acute cough: Secondary | ICD-10-CM | POA: Diagnosis not present

## 2022-11-28 ENCOUNTER — Other Ambulatory Visit: Payer: Self-pay

## 2022-11-28 DIAGNOSIS — E1165 Type 2 diabetes mellitus with hyperglycemia: Secondary | ICD-10-CM

## 2022-12-02 ENCOUNTER — Other Ambulatory Visit (INDEPENDENT_AMBULATORY_CARE_PROVIDER_SITE_OTHER): Payer: BC Managed Care – PPO

## 2022-12-02 DIAGNOSIS — E1165 Type 2 diabetes mellitus with hyperglycemia: Secondary | ICD-10-CM | POA: Diagnosis not present

## 2022-12-02 DIAGNOSIS — Z794 Long term (current) use of insulin: Secondary | ICD-10-CM | POA: Diagnosis not present

## 2022-12-02 LAB — BASIC METABOLIC PANEL
BUN: 11 mg/dL (ref 6–23)
CO2: 28 meq/L (ref 19–32)
Calcium: 9.4 mg/dL (ref 8.4–10.5)
Chloride: 105 meq/L (ref 96–112)
Creatinine, Ser: 0.68 mg/dL (ref 0.40–1.20)
GFR: 96.79 mL/min (ref >60.00)
Glucose, Bld: 131 mg/dL — ABNORMAL HIGH (ref 70–99)
Potassium: 4.1 meq/L (ref 3.5–5.1)
Sodium: 142 meq/L (ref 135–145)

## 2022-12-02 LAB — HEMOGLOBIN A1C: Hgb A1c MFr Bld: 7 % — ABNORMAL HIGH (ref 4.6–6.5)

## 2022-12-04 ENCOUNTER — Other Ambulatory Visit: Payer: BC Managed Care – PPO

## 2022-12-09 ENCOUNTER — Ambulatory Visit (INDEPENDENT_AMBULATORY_CARE_PROVIDER_SITE_OTHER): Payer: BC Managed Care – PPO | Admitting: Endocrinology

## 2022-12-09 ENCOUNTER — Encounter: Payer: Self-pay | Admitting: Endocrinology

## 2022-12-09 VITALS — BP 138/80 | HR 87 | Resp 20 | Ht 62.0 in | Wt 156.0 lb

## 2022-12-09 DIAGNOSIS — Z794 Long term (current) use of insulin: Secondary | ICD-10-CM | POA: Diagnosis not present

## 2022-12-09 DIAGNOSIS — E119 Type 2 diabetes mellitus without complications: Secondary | ICD-10-CM

## 2022-12-09 MED ORDER — DAPAGLIFLOZIN PROPANEDIOL 10 MG PO TABS: 10.0000 mg | ORAL_TABLET | Freq: Every day | ORAL | 3 refills | Status: DC

## 2022-12-09 MED ORDER — FREESTYLE LIBRE 3 PLUS SENSOR MISC: 1.0000 | 3 refills | Status: DC

## 2022-12-09 MED ORDER — OZEMPIC (1 MG/DOSE) 4 MG/3ML ~~LOC~~ SOPN
PEN_INJECTOR | SUBCUTANEOUS | 2 refills | Status: DC
Start: 2022-12-09 — End: 2023-08-18

## 2022-12-09 MED ORDER — ACCU-CHEK FASTCLIX LANCETS MISC: 3 refills | Status: AC

## 2022-12-09 MED ORDER — BD PEN NEEDLE MINI U/F 31G X 5 MM MISC: 2 refills | Status: AC

## 2022-12-09 MED ORDER — METFORMIN HCL ER 500 MG PO TB24: 1000.0000 mg | ORAL_TABLET | Freq: Every day | ORAL | 3 refills | Status: AC

## 2022-12-09 MED ORDER — GLUCOSE BLOOD VI STRP: ORAL_STRIP | 12 refills | Status: AC

## 2022-12-09 MED ORDER — NOVOLOG MIX 70/30 FLEXPEN (70-30) 100 UNIT/ML ~~LOC~~ SUPN: PEN_INJECTOR | SUBCUTANEOUS | 2 refills | Status: DC

## 2022-12-09 NOTE — Patient Instructions (Signed)
No change in diabetes regimen

## 2022-12-09 NOTE — Progress Notes (Signed)
Outpatient Endocrinology Note [place] Printice Hellmer, MD  12/09/22  Patient's Name: Veronica Fletcher    DOB: August 29, 1965    MRN: [id number]                                                    REASON OF VISIT: Follow up for type 2 diabetes mellitus  PCP: Daisy Floro, MD  HISTORY OF PRESENT ILLNESS:   Veronica Fletcher is a 57 y.o. old female with past medical history listed below, is here for follow up for type 2 diabetes mellitus.  Patient was last seen by Dr. Lucianne Muss in July 2024.  Pertinent Diabetes History: Patient was diagnosed with type 2 diabetes mellitus in 2005.  In 2015 patient was hospitalized due to abdominal pain presumed to be pancreatitis and thought to be related with Victoza.  Insulin therapy was started in 2015.  H/o pancreatitis , ? Victoza in 2015.   Chronic Diabetes Complications : Retinopathy: no. Last ophthalmology exam was done on annually, following with ophthalmology regularly.  Nephropathy: no, on ACE/ARB /ramipril/Farxiga Peripheral neuropathy: no Coronary artery disease: no Stroke: no  Relevant comorbidities and cardiovascular risk factors: Obesity: no Body mass index is 28.53 kg/m.  Hypertension: Yes  Hyperlipidemia : Yes, on statin   Current / Home Diabetic regimen includes:  INSULIN regimen is: NovoLog mix at meals: 18 a.m.-14 before dinner   Non-insulin hypoglycemic drugs: Farxiga 10 mg daily, metformin ER 1000 mg daily.  Ozempic 1.0 mg weekly  Discussed the risk of acute pancreatitis as a side effect from use of Ozempic as she had history of pancreatitis from Victoza in 2015.  Patient would like to continue on Ozempic.  Prior diabetic medications: V-go pump 20 unit basal and boluses 4 units previously stopped. Victoza, stopped due to?  Pancreatitis. Jardiance in the past.  Glycemic data:   Accu-Chek guide glucometer data reviewed from November 12 to November 26, average blood sugar 138.  She has been checking mostly 1 time a day.  Lowest blood  sugar 114, highest blood sugar 176.  Fasting blood sugar 140, 123, blood sugar in the evening 128, 140, 142, 114, 176.  No hypoglycemia.  No significant hyperglycemia.  She uses freestyle libre CGM intermittently, mainly due to cost issue.  Hypoglycemia: Patient has no hypoglycemic episodes. Patient has hypoglycemia awareness.  Factors modifying glucose control: 1.  Diabetic diet assessment: 3 meals a day.  She is trying to limit portion.  2.  Staying active or exercising:   3.  Medication compliance: compliant all of the time.  Interval history  Hemoglobin A1c recently 7%.  Glucometer data as reviewed above.  Diabetes regimen as noted above.  She denies any abdominal pain, nausea or vomiting.  Tolerating Ozempic well.  Patient lost about 3 pounds of weight in last 4 months.  She has no other complaints today.  REVIEW OF SYSTEMS As per history of present illness.   PAST MEDICAL HISTORY: Past Medical History:  Diagnosis Date   Anemia    Asthma    Diabetes mellitus    Family history of adverse reaction to anesthesia    Brother   Heart murmur    Hyperlipidemia    Hypertension    Pancreatitis    Pneumonia     PAST SURGICAL HISTORY: Past Surgical History:  Procedure Laterality Date   ABDOMINAL HYSTERECTOMY  ANTERIOR LAT LUMBAR FUSION N/A 07/07/2019   Procedure: ANTERIOR LATERAL LUMBAR FUSION (XLIF) LUMBAR TWO THROUGH LUMBAR THREE, POSTERIOR SPINAL FUSION INTERBODY LUMBAR TWO THROUGH LUMBAR THREE;  Surgeon: Venita Lick, MD;  Location: [facility] OR;  Service: Orthopedics;  Laterality: N/A;  4 HRS   CESAREAN SECTION     CHOLECYSTECTOMY     GANGLION CYST EXCISION     HIP CAPSULECTOMY Right 10/14/2018   Procedure: RIGHT HIP HETEROTOPIC OSSIFICATION RESECTION;  Surgeon: Roby Lofts, MD;  Location: [facility] OR;  Service: Orthopedics;  Laterality: Right;   LAPAROSCOPY     LUMBAR FUSION      ALLERGIES: Allergies  Allergen Reactions   Liraglutide Other (See Comments)    Other  reaction(s): Pancreatitis  *Victoza     FAMILY HISTORY:  Family History  Problem Relation Age of Onset   Glaucoma Mother    Diabetes Mother    Hypertension Mother    Stroke Mother    Glaucoma Brother    Melanoma Brother    Hypertension Brother    Diabetes Maternal Aunt    Diabetes Paternal Aunt    Diabetes Maternal Grandmother    Heart disease Neg Hx     SOCIAL HISTORY: Social History   Socioeconomic History   Marital status: Divorced    Spouse name: Not on file   Number of children: 1   Years of education: Not on file   Highest education level: Not on file  Occupational History   Not on file  Tobacco Use   Smoking status: Some Days    Types: Cigars   Smokeless tobacco: Never  Vaping Use   Vaping status: Never Used  Substance and Sexual Activity   Alcohol use: Yes    Comment: occasionally   Drug use: No   Sexual activity: Not Currently  Other Topics Concern   Not on file  Social History Narrative   Not on file   Social Determinants of Health   Financial Resource Strain: Not on file  Food Insecurity: Not on file  Transportation Needs: Not on file  Physical Activity: Not on file  Stress: Not on file  Social Connections: Not on file    MEDICATIONS:  Current Outpatient Medications  Medication Sig Dispense Refill   albuterol (VENTOLIN HFA) 108 (90 Base) MCG/ACT inhaler Inhale 1-2 puffs into the lungs every 4 (four) hours as needed for wheezing or shortness of breath.     aspirin EC 81 MG tablet Take 81 mg by mouth daily. Swallow whole.     Blood Glucose Monitoring Suppl (ACCU-CHEK GUIDE) w/Device KIT 1 each by Does not apply route 2 (two) times a day. Use as instructed to check blood sugar twice daily. 1 kit 0   Continuous Glucose Sensor (FREESTYLE LIBRE 3 PLUS SENSOR) MISC 1 each by Does not apply route continuous. Change every 15 days. 6 each 3   fluticasone (FLONASE) 50 MCG/ACT nasal spray Place 1 spray into both nostrils daily as needed for allergies or  rhinitis.     fluticasone (FLOVENT HFA) 110 MCG/ACT inhaler Flovent HFA 110 mcg/actuation aerosol inhaler     glucose blood test strip 2 times a day and use as instructed 100 each 12   ibuprofen (ADVIL) 600 MG tablet Take 600 mg by mouth every 6 (six) hours.     Naproxen Sod-diphenhydrAMINE (ALEVE PM) 220-25 MG TABS 2 tablets Orally at bedtime     ramipril (ALTACE) 10 MG capsule Take 10 mg by mouth daily.  rosuvastatin (CRESTOR) 20 MG tablet TAKE 1 TABLET BY MOUTH EVERYDAY AT BEDTIME 90 tablet 3   topiramate (TOPAMAX) 25 MG tablet      Accu-Chek FastClix Lancets MISC Use Accu Chek Fastclix lancets to check blood sugar twice daily. 204 each 3   Continuous Blood Gluc Sensor (FREESTYLE LIBRE 3 SENSOR) MISC 1 Device by Does not apply route every 14 (fourteen) days. Apply 1 sensor on upper arm every 14 days for continuous glucose monitoring (Patient not taking: Reported on 12/09/2022) 2 each 3   dapagliflozin propanediol (FARXIGA) 10 MG TABS tablet Take 1 tablet (10 mg total) by mouth daily. 90 tablet 3   insulin aspart protamine - aspart (NOVOLOG MIX 70/30 FLEXPEN) (70-30) 100 UNIT/ML FlexPen Inject 14 Units into the skin at bedtime and 18 units into the skin in the morning daily with a meal. 30 mL 2   Insulin Pen Needle (B-D UF III MINI PEN NEEDLES) 31G X 5 MM MISC 1 EACH BY DOES NOT APPLY ROUTE 2 (TWO) TIMES DAILY. USE TO INJECT INSULIN TWICE DAILY. 100 each 2   metFORMIN (GLUCOPHAGE-XR) 500 MG 24 hr tablet Take 2 tablets (1,000 mg total) by mouth daily. 180 tablet 3   Semaglutide, 1 MG/DOSE, (OZEMPIC, 1 MG/DOSE,) 4 MG/3ML SOPN INJECT 1MG  SUBCUTANEOUSLY  WEEKLY (EVERY 7 DAYS). 9 mL 2   No current facility-administered medications for this visit.    PHYSICAL EXAM: Vitals:   12/09/22 0840  BP: 138/80  Pulse: 87  Resp: 20  SpO2: 98%  Weight: 156 lb (70.8 kg)  Height: 5\' 2"  (1.575 m)   Body mass index is 28.53 kg/m.  Wt Readings from Last 3 Encounters:  12/09/22 156 lb (70.8 kg)   07/24/22 158 lb 12.8 oz (72 kg)  07/15/22 159 lb 12.8 oz (72.5 kg)    General: Well developed, well nourished female in no apparent distress.  HEENT: AT/[place], no external lesions.  Eyes: Conjunctiva clear and no icterus. Neck: Neck supple  Lungs: Respirations not labored Neurologic: Alert, oriented, normal speech Extremities / Skin: Dry. No sores or rashes noted.  Psychiatric: Does not appear depressed or anxious  Diabetic Foot Exam - Simple   No data filed    LABS Reviewed Lab Results  Component Value Date   HGBA1C 7.0 (H) 12/02/2022   HGBA1C 6.8 (H) 07/22/2022   HGBA1C 6.3 03/11/2022   Lab Results  Component Value Date   FRUCTOSAMINE 293 (H) 03/22/2018   FRUCTOSAMINE 250 08/12/2017   FRUCTOSAMINE 290 (H) 06/16/2017   Lab Results  Component Value Date   CHOL 141 07/22/2022   HDL 53.10 07/22/2022   LDLCALC 75 07/22/2022   LDLDIRECT 75.0 03/22/2018   TRIG 64.0 07/22/2022   CHOLHDL 3 07/22/2022   Lab Results  Component Value Date   MICRALBCREAT 0.7 03/11/2022   MICRALBCREAT 0.9 03/26/2021   Lab Results  Component Value Date   CREATININE 0.68 12/02/2022   Lab Results  Component Value Date   GFR 96.79 12/02/2022    ASSESSMENT / PLAN  1. Controlled type 2 diabetes mellitus without complication, with long-term current use of insulin (HCC)     Diabetes Mellitus type 2, complicated by no known complications. - Diabetic status / severity: Fair control.  Lab Results  Component Value Date   HGBA1C 7.0 (H) 12/02/2022    - Hemoglobin A1c goal : <7%  - Medications: No change.  INSULIN regimen is: NovoLog mix at meals: 18 a.m.-14 before dinner   Non-insulin hypoglycemic drugs: Farxiga 10  mg daily, metformin ER 1000 mg daily.  Ozempic 1.0 mg weekly  - Home glucose testing: Use CGM as able, check blood sugar at least 2 times a day before meals and at bedtime. - Discussed/ Gave Hypoglycemia treatment plan.  # Consult : not required at this time.   # Annual  urine for microalbuminuria/ creatinine ratio, no microalbuminuria currently, continue ACE/ARB /ramipril. Last  Lab Results  Component Value Date   MICRALBCREAT 0.7 03/11/2022    # Foot check nightly.  # Annual dilated diabetic eye exams.   - Diet: Make healthy diabetic food choices - Life style / activity / exercise: Discussed.  2. Blood pressure  -  BP Readings from Last 1 Encounters:  12/09/22 138/80    - Control is in target.  - No change in current plans.  3. Lipid status / Hyperlipidemia - Last  Lab Results  Component Value Date   LDLCALC 75 07/22/2022   - Continue rosuvastatin 20 mg daily.  Managed by cardiology.  Diagnoses and all orders for this visit:  Controlled type 2 diabetes mellitus without complication, with long-term current use of insulin (HCC) -     Microalbumin / creatinine urine ratio -     Hemoglobin A1c -     Basic metabolic panel -     Insulin Pen Needle (B-D UF III MINI PEN NEEDLES) 31G X 5 MM MISC; 1 EACH BY DOES NOT APPLY ROUTE 2 (TWO) TIMES DAILY. USE TO INJECT INSULIN TWICE DAILY. -     metFORMIN (GLUCOPHAGE-XR) 500 MG 24 hr tablet; Take 2 tablets (1,000 mg total) by mouth daily. -     Semaglutide, 1 MG/DOSE, (OZEMPIC, 1 MG/DOSE,) 4 MG/3ML SOPN; INJECT 1MG  SUBCUTANEOUSLY  WEEKLY (EVERY 7 DAYS). -     dapagliflozin propanediol (FARXIGA) 10 MG TABS tablet; Take 1 tablet (10 mg total) by mouth daily.  Other orders -     insulin aspart protamine - aspart (NOVOLOG MIX 70/30 FLEXPEN) (70-30) 100 UNIT/ML FlexPen; Inject 14 Units into the skin at bedtime and 18 units into the skin in the morning daily with a meal. -     Continuous Glucose Sensor (FREESTYLE LIBRE 3 PLUS SENSOR) MISC; 1 each by Does not apply route continuous. Change every 15 days. -     Accu-Chek FastClix Lancets MISC; Use Accu Chek Fastclix lancets to check blood sugar twice daily. -     glucose blood test strip; 2 times a day and use as instructed    DISPOSITION Follow up in  clinic in 4  months suggested.   All questions answered and patient verbalized understanding of the plan.  [place] Jera Headings, MD [facility] [address] [place], [address] [place], [place] [address] Phone # [phone]  At least part of this note was generated using voice recognition software. Inadvertent word errors may have occurred, which were not recognized during the proofreading process.

## 2023-03-19 ENCOUNTER — Other Ambulatory Visit: Payer: Self-pay

## 2023-03-27 ENCOUNTER — Encounter (INDEPENDENT_AMBULATORY_CARE_PROVIDER_SITE_OTHER): Admitting: Ophthalmology

## 2023-03-27 DIAGNOSIS — Z794 Long term (current) use of insulin: Secondary | ICD-10-CM | POA: Diagnosis not present

## 2023-03-27 DIAGNOSIS — H43812 Vitreous degeneration, left eye: Secondary | ICD-10-CM | POA: Diagnosis not present

## 2023-03-27 DIAGNOSIS — I10 Essential (primary) hypertension: Secondary | ICD-10-CM

## 2023-03-27 DIAGNOSIS — Z7985 Long-term (current) use of injectable non-insulin antidiabetic drugs: Secondary | ICD-10-CM

## 2023-03-27 DIAGNOSIS — Z7984 Long term (current) use of oral hypoglycemic drugs: Secondary | ICD-10-CM

## 2023-03-27 DIAGNOSIS — H25813 Combined forms of age-related cataract, bilateral: Secondary | ICD-10-CM | POA: Diagnosis not present

## 2023-03-27 DIAGNOSIS — H40053 Ocular hypertension, bilateral: Secondary | ICD-10-CM | POA: Diagnosis not present

## 2023-03-27 DIAGNOSIS — H47092 Other disorders of optic nerve, not elsewhere classified, left eye: Secondary | ICD-10-CM | POA: Diagnosis not present

## 2023-03-27 DIAGNOSIS — E113592 Type 2 diabetes mellitus with proliferative diabetic retinopathy without macular edema, left eye: Secondary | ICD-10-CM

## 2023-03-27 DIAGNOSIS — H35033 Hypertensive retinopathy, bilateral: Secondary | ICD-10-CM

## 2023-03-27 DIAGNOSIS — E113391 Type 2 diabetes mellitus with moderate nonproliferative diabetic retinopathy without macular edema, right eye: Secondary | ICD-10-CM

## 2023-03-27 DIAGNOSIS — H43813 Vitreous degeneration, bilateral: Secondary | ICD-10-CM

## 2023-03-27 DIAGNOSIS — H2513 Age-related nuclear cataract, bilateral: Secondary | ICD-10-CM

## 2023-03-31 DIAGNOSIS — J309 Allergic rhinitis, unspecified: Secondary | ICD-10-CM | POA: Diagnosis not present

## 2023-03-31 DIAGNOSIS — Z0000 Encounter for general adult medical examination without abnormal findings: Secondary | ICD-10-CM | POA: Diagnosis not present

## 2023-03-31 DIAGNOSIS — E663 Overweight: Secondary | ICD-10-CM | POA: Diagnosis not present

## 2023-03-31 DIAGNOSIS — I10 Essential (primary) hypertension: Secondary | ICD-10-CM | POA: Diagnosis not present

## 2023-03-31 DIAGNOSIS — J4520 Mild intermittent asthma, uncomplicated: Secondary | ICD-10-CM | POA: Diagnosis not present

## 2023-04-02 ENCOUNTER — Other Ambulatory Visit: Payer: BC Managed Care – PPO

## 2023-04-02 DIAGNOSIS — Z794 Long term (current) use of insulin: Secondary | ICD-10-CM | POA: Diagnosis not present

## 2023-04-02 DIAGNOSIS — E1165 Type 2 diabetes mellitus with hyperglycemia: Secondary | ICD-10-CM | POA: Diagnosis not present

## 2023-04-03 ENCOUNTER — Encounter: Payer: Self-pay | Admitting: Endocrinology

## 2023-04-03 LAB — BASIC METABOLIC PANEL WITH GFR
BUN: 21 mg/dL (ref 7–25)
CO2: 25 mmol/L (ref 20–32)
Calcium: 9.7 mg/dL (ref 8.6–10.4)
Chloride: 103 mmol/L (ref 98–110)
Creat: 0.77 mg/dL (ref 0.50–1.03)
Glucose, Bld: 131 mg/dL — ABNORMAL HIGH (ref 65–99)
Potassium: 3.6 mmol/L (ref 3.5–5.3)
Sodium: 138 mmol/L (ref 135–146)

## 2023-04-03 LAB — MICROALBUMIN / CREATININE URINE RATIO
Creatinine, Urine: 91 mg/dL (ref 20–275)
Microalb Creat Ratio: 2 mg/g{creat} (ref <30)
Microalb, Ur: 0.2 mg/dL

## 2023-04-03 LAB — HEMOGLOBIN A1C
Hgb A1c MFr Bld: 6.4 %{Hb} — ABNORMAL HIGH (ref <5.7)
Mean Plasma Glucose: 137 mg/dL
eAG (mmol/L): 7.6 mmol/L

## 2023-04-08 ENCOUNTER — Encounter: Payer: Self-pay | Admitting: Endocrinology

## 2023-04-08 ENCOUNTER — Ambulatory Visit (INDEPENDENT_AMBULATORY_CARE_PROVIDER_SITE_OTHER): Payer: BC Managed Care – PPO | Admitting: Endocrinology

## 2023-04-08 VITALS — BP 126/78 | HR 89 | Ht 62.0 in | Wt 148.0 lb

## 2023-04-08 DIAGNOSIS — Z794 Long term (current) use of insulin: Secondary | ICD-10-CM | POA: Diagnosis not present

## 2023-04-08 DIAGNOSIS — E119 Type 2 diabetes mellitus without complications: Secondary | ICD-10-CM | POA: Diagnosis not present

## 2023-04-08 NOTE — Progress Notes (Signed)
 Outpatient Endocrinology Note [place] Oryn Casanova, MD  04/08/23  Patient's Name: Veronica Fletcher    DOB: 11/13/1965    MRN: [id number]                                                    REASON OF VISIT: Follow up for type 2 diabetes mellitus  PCP: Daisy Floro, MD  HISTORY OF PRESENT ILLNESS:   Veronica Fletcher is a 58 y.o. old female with past medical history listed below, is here for follow up for type 2 diabetes mellitus.  Patient was last seen by Dr. Lucianne Muss in July 2024.  Pertinent Diabetes History: Patient was diagnosed with type 2 diabetes mellitus in 2005.  In 2015 patient was hospitalized due to abdominal pain presumed to be pancreatitis and thought to be related with Victoza.  Insulin therapy was started in 2015.  H/o pancreatitis , ? Victoza in 2015.   Chronic Diabetes Complications : Retinopathy: yes. Last ophthalmology exam was done on ? annually, following with regularly with retina specialist and ophthalmology. Nephropathy: no, on ACE/ARB /ramipril/Farxiga Peripheral neuropathy: no Coronary artery disease: no Stroke: no  Relevant comorbidities and cardiovascular risk factors: Obesity: no Body mass index is 27.07 kg/m.  Hypertension: Yes  Hyperlipidemia : Yes, on statin   Current / Home Diabetic regimen includes:  INSULIN regimen is: NovoLog mix at meals: 10-18 units AM-12 units  before dinner   Non-insulin hypoglycemic drugs: Farxiga 10 mg daily, metformin ER 1000 mg daily.  Ozempic 1.0 mg weekly  Discussed the risk of acute pancreatitis as a side effect from use of Ozempic as she had history of pancreatitis from Victoza in 2015.  Patient would like to continue on Ozempic.  Prior diabetic medications: V-go pump 20 unit basal and boluses 4 units previously stopped. Victoza, stopped due to?  Pancreatitis. Jardiance in the past.  Glycemic data:   Not able to download and reviewed the glucose data from glucometer in the clinic today.    She uses freestyle libre  CGM intermittently, mainly due to cost issue.  Hypoglycemia: Patient has no hypoglycemic episodes. Patient has hypoglycemia awareness.  Factors modifying glucose control: 1.  Diabetic diet assessment: 3 meals a day.  She is trying to limit portion.  2.  Staying active or exercising:   3.  Medication compliance: compliant all of the time.  Interval history  Hemoglobin A1c improved to 6.4%.  Recent laboratory results reviewed, normal urine microalbumin creatinine ratio and stable renal function.  Patient reports she has been regularly following with retina specialist and ophthalmology, she has retinal detachment and also recently told to have diabetic eye problem diabetic retinopathy and plan for laser treatment.  No other complaints today.  REVIEW OF SYSTEMS As per history of present illness.   PAST MEDICAL HISTORY: Past Medical History:  Diagnosis Date   Anemia    Asthma    Diabetes mellitus    Family history of adverse reaction to anesthesia    Brother   Heart murmur    Hyperlipidemia    Hypertension    Pancreatitis    Pneumonia     PAST SURGICAL HISTORY: Past Surgical History:  Procedure Laterality Date   ABDOMINAL HYSTERECTOMY     ANTERIOR LAT LUMBAR FUSION N/A 07/07/2019   Procedure: ANTERIOR LATERAL LUMBAR FUSION (XLIF) LUMBAR TWO THROUGH LUMBAR THREE, POSTERIOR  SPINAL FUSION INTERBODY LUMBAR TWO THROUGH LUMBAR THREE;  Surgeon: Venita Lick, MD;  Location: [facility] OR;  Service: Orthopedics;  Laterality: N/A;  4 HRS   CESAREAN SECTION     CHOLECYSTECTOMY     GANGLION CYST EXCISION     HIP CAPSULECTOMY Right 10/14/2018   Procedure: RIGHT HIP HETEROTOPIC OSSIFICATION RESECTION;  Surgeon: Roby Lofts, MD;  Location: [facility] OR;  Service: Orthopedics;  Laterality: Right;   LAPAROSCOPY     LUMBAR FUSION      ALLERGIES: Allergies  Allergen Reactions   Liraglutide Other (See Comments)    Other reaction(s): Pancreatitis  *Victoza     FAMILY HISTORY:  Family History   Problem Relation Age of Onset   Glaucoma Mother    Diabetes Mother    Hypertension Mother    Stroke Mother    Glaucoma Brother    Melanoma Brother    Hypertension Brother    Diabetes Maternal Aunt    Diabetes Paternal Aunt    Diabetes Maternal Grandmother    Heart disease Neg Hx     SOCIAL HISTORY: Social History   Socioeconomic History   Marital status: Divorced    Spouse name: Not on file   Number of children: 1   Years of education: Not on file   Highest education level: Not on file  Occupational History   Not on file  Tobacco Use   Smoking status: Some Days    Types: Cigars   Smokeless tobacco: Never  Vaping Use   Vaping status: Never Used  Substance and Sexual Activity   Alcohol use: Yes    Comment: occasionally   Drug use: No   Sexual activity: Not Currently  Other Topics Concern   Not on file  Social History Narrative   Not on file   Social Drivers of Health   Financial Resource Strain: Not on file  Food Insecurity: Not on file  Transportation Needs: Not on file  Physical Activity: Not on file  Stress: Not on file  Social Connections: Not on file    MEDICATIONS:  Current Outpatient Medications  Medication Sig Dispense Refill   Accu-Chek FastClix Lancets MISC Use Accu Chek Fastclix lancets to check blood sugar twice daily. 204 each 3   albuterol (VENTOLIN HFA) 108 (90 Base) MCG/ACT inhaler Inhale 1-2 puffs into the lungs every 4 (four) hours as needed for wheezing or shortness of breath.     aspirin EC 81 MG tablet Take 81 mg by mouth daily. Swallow whole.     Blood Glucose Monitoring Suppl (ACCU-CHEK GUIDE) w/Device KIT 1 each by Does not apply route 2 (two) times a day. Use as instructed to check blood sugar twice daily. 1 kit 0   dapagliflozin propanediol (FARXIGA) 10 MG TABS tablet Take 1 tablet (10 mg total) by mouth daily. 90 tablet 3   fluticasone (FLONASE) 50 MCG/ACT nasal spray Place 1 spray into both nostrils daily as needed for allergies  or rhinitis.     fluticasone (FLOVENT HFA) 110 MCG/ACT inhaler Flovent HFA 110 mcg/actuation aerosol inhaler     glucose blood test strip 2 times a day and use as instructed 100 each 12   ibuprofen (ADVIL) 600 MG tablet Take 600 mg by mouth every 6 (six) hours.     insulin aspart protamine - aspart (NOVOLOG MIX 70/30 FLEXPEN) (70-30) 100 UNIT/ML FlexPen Inject 14 Units into the skin at bedtime and 18 units into the skin in the morning daily with a meal. (Patient taking  differently: Inject 18 Units into the skin at bedtime and 14 units into the skin in the morning daily with a meal.) 30 mL 2   Insulin Pen Needle (B-D UF III MINI PEN NEEDLES) 31G X 5 MM MISC 1 EACH BY DOES NOT APPLY ROUTE 2 (TWO) TIMES DAILY. USE TO INJECT INSULIN TWICE DAILY. 100 each 2   metFORMIN (GLUCOPHAGE-XR) 500 MG 24 hr tablet Take 2 tablets (1,000 mg total) by mouth daily. 180 tablet 3   Naproxen Sod-diphenhydrAMINE (ALEVE PM) 220-25 MG TABS 2 tablets Orally at bedtime     ramipril (ALTACE) 10 MG capsule Take 10 mg by mouth daily.     rosuvastatin (CRESTOR) 20 MG tablet TAKE 1 TABLET BY MOUTH EVERYDAY AT BEDTIME 90 tablet 3   Semaglutide, 1 MG/DOSE, (OZEMPIC, 1 MG/DOSE,) 4 MG/3ML SOPN INJECT 1MG  SUBCUTANEOUSLY  WEEKLY (EVERY 7 DAYS). 9 mL 2   topiramate (TOPAMAX) 25 MG tablet      Continuous Blood Gluc Sensor (FREESTYLE LIBRE 3 SENSOR) MISC 1 Device by Does not apply route every 14 (fourteen) days. Apply 1 sensor on upper arm every 14 days for continuous glucose monitoring (Patient not taking: Reported on 04/08/2023) 2 each 3   Continuous Glucose Sensor (FREESTYLE LIBRE 3 PLUS SENSOR) MISC 1 each by Does not apply route continuous. Change every 15 days. (Patient not taking: Reported on 04/08/2023) 6 each 3   No current facility-administered medications for this visit.    PHYSICAL EXAM: Vitals:   04/08/23 0817  BP: 126/78  Pulse: 89  SpO2: 99%  Weight: 148 lb (67.1 kg)  Height: 5\' 2"  (1.575 m)   Body mass index is 27.07  kg/m.  Wt Readings from Last 3 Encounters:  04/08/23 148 lb (67.1 kg)  12/09/22 156 lb (70.8 kg)  07/24/22 158 lb 12.8 oz (72 kg)    General: Well developed, well nourished female in no apparent distress.  HEENT: AT/[place], no external lesions.  Eyes: Conjunctiva clear and no icterus. Neck: Neck supple  Lungs: Respirations not labored Neurologic: Alert, oriented, normal speech Extremities / Skin: Dry.   Psychiatric: Does not appear depressed or anxious  Diabetic Foot Exam - Simple   No data filed    LABS Reviewed Lab Results  Component Value Date   HGBA1C 6.4 (H) 04/02/2023   HGBA1C 7.0 (H) 12/02/2022   HGBA1C 6.8 (H) 07/22/2022   Lab Results  Component Value Date   FRUCTOSAMINE 293 (H) 03/22/2018   FRUCTOSAMINE 250 08/12/2017   FRUCTOSAMINE 290 (H) 06/16/2017   Lab Results  Component Value Date   CHOL 141 07/22/2022   HDL 53.10 07/22/2022   LDLCALC 75 07/22/2022   LDLDIRECT 75.0 03/22/2018   TRIG 64.0 07/22/2022   CHOLHDL 3 07/22/2022   Lab Results  Component Value Date   MICRALBCREAT 2 04/02/2023   MICRALBCREAT 0.7 03/11/2022   Lab Results  Component Value Date   CREATININE 0.77 04/02/2023   Lab Results  Component Value Date   GFR 96.79 12/02/2022    ASSESSMENT / PLAN  1. Controlled type 2 diabetes mellitus without complication, with long-term current use of insulin (HCC)      Diabetes Mellitus type 2, complicated by no known complications. - Diabetic status / severity: Fair control.  Lab Results  Component Value Date   HGBA1C 6.4 (H) 04/02/2023    - Hemoglobin A1c goal : <6.5%  - Medications: Adjusted and discussed as follows..  INSULIN regimen is: NovoLog mix at meals: 10-18 units a.m.-12 before dinner.  Okay to adjust couple of units based on meal size and blood sugar.   Non-insulin hypoglycemic drugs: Farxiga 10 mg daily, metformin ER 1000 mg daily.  Ozempic 1.0 mg weekly.  In regard to Ozempic, check with your eye doctor if it is okay  to continue or need to hold because of active diabetes eye problem/diabetic retinopathy which is being or planned to be treated.  - Home glucose testing: Use CGM as able, check blood sugar at least 2 times a day before meals and at bedtime. - Discussed/ Gave Hypoglycemia treatment plan.  # Consult : not required at this time.   # Annual urine for microalbuminuria/ creatinine ratio, no microalbuminuria currently, continue ACE/ARB /ramipril. Last  Lab Results  Component Value Date   MICRALBCREAT 2 04/02/2023    # Foot check nightly.  # Diabetic retinopathy, regular follow-up with ophthalmology and retina specialist.  - Diet: Make healthy diabetic food choices - Life style / activity / exercise: Discussed.  2. Blood pressure  -  BP Readings from Last 1 Encounters:  04/08/23 126/78    - Control is in target.  - No change in current plans.  3. Lipid status / Hyperlipidemia - Last  Lab Results  Component Value Date   LDLCALC 75 07/22/2022   - Continue rosuvastatin 20 mg daily.  Managed by cardiology.  Veronica Fletcher was seen today for follow-up.  Diagnoses and all orders for this visit:  Controlled type 2 diabetes mellitus without complication, with long-term current use of insulin (HCC)    DISPOSITION Follow up in clinic in 4  months suggested.  Labs on the same day of the visit.   All questions answered and patient verbalized understanding of the plan.  [place] Maureena Dabbs, MD [facility] [address] [place], [address] [place], [place] [address] Phone # [phone]  At least part of this note was generated using voice recognition software. Inadvertent word errors may have occurred, which were not recognized during the proofreading process.

## 2023-04-08 NOTE — Patient Instructions (Addendum)
 Latest Reference Range & Units 07/22/22 08:18 12/02/22 08:26 04/02/23 08:16  Hemoglobin A1C <5.7 % of total Hgb 6.8 (H) 7.0 (H) 6.4 (H)  (H): Data is abnormally high  No change in diabetes medications.  In regard to Ozempic, check with your eye doctor if it is okay to continue or need to hold because of active diabetes eye problem/diabetic retinopathy which is being or planned to be treated.

## 2023-04-21 ENCOUNTER — Encounter (INDEPENDENT_AMBULATORY_CARE_PROVIDER_SITE_OTHER): Admitting: Ophthalmology

## 2023-04-21 ENCOUNTER — Other Ambulatory Visit: Payer: Self-pay | Admitting: Family Medicine

## 2023-04-21 DIAGNOSIS — H35051 Retinal neovascularization, unspecified, right eye: Secondary | ICD-10-CM | POA: Diagnosis not present

## 2023-04-21 DIAGNOSIS — Z7985 Long-term (current) use of injectable non-insulin antidiabetic drugs: Secondary | ICD-10-CM

## 2023-04-21 DIAGNOSIS — H4312 Vitreous hemorrhage, left eye: Secondary | ICD-10-CM

## 2023-04-21 DIAGNOSIS — Z794 Long term (current) use of insulin: Secondary | ICD-10-CM

## 2023-04-21 DIAGNOSIS — Z7984 Long term (current) use of oral hypoglycemic drugs: Secondary | ICD-10-CM

## 2023-04-21 DIAGNOSIS — I10 Essential (primary) hypertension: Secondary | ICD-10-CM

## 2023-04-21 DIAGNOSIS — H2513 Age-related nuclear cataract, bilateral: Secondary | ICD-10-CM

## 2023-04-21 DIAGNOSIS — E113593 Type 2 diabetes mellitus with proliferative diabetic retinopathy without macular edema, bilateral: Secondary | ICD-10-CM | POA: Diagnosis not present

## 2023-04-21 DIAGNOSIS — Z1231 Encounter for screening mammogram for malignant neoplasm of breast: Secondary | ICD-10-CM

## 2023-04-21 DIAGNOSIS — H35033 Hypertensive retinopathy, bilateral: Secondary | ICD-10-CM

## 2023-04-21 DIAGNOSIS — H43813 Vitreous degeneration, bilateral: Secondary | ICD-10-CM

## 2023-04-30 ENCOUNTER — Ambulatory Visit
Admission: RE | Admit: 2023-04-30 | Discharge: 2023-04-30 | Disposition: A | Discharge status: Home / Self Care | Source: Ambulatory Visit

## 2023-04-30 DIAGNOSIS — Z1231 Encounter for screening mammogram for malignant neoplasm of breast: Secondary | ICD-10-CM | POA: Diagnosis not present

## 2023-05-12 ENCOUNTER — Ambulatory Visit (HOSPITAL_COMMUNITY): Payer: BC Managed Care – PPO | Attending: Internal Medicine

## 2023-05-12 ENCOUNTER — Other Ambulatory Visit: Payer: BC Managed Care – PPO

## 2023-05-12 DIAGNOSIS — I342 Nonrheumatic mitral (valve) stenosis: Secondary | ICD-10-CM

## 2023-05-12 DIAGNOSIS — I059 Rheumatic mitral valve disease, unspecified: Secondary | ICD-10-CM | POA: Insufficient documentation

## 2023-05-12 LAB — ECHOCARDIOGRAM COMPLETE
Area-P 1/2: 3.86 cm2
MV VTI: 1.34 cm2
P 1/2 time: 314 ms
S' Lateral: 2.3 cm

## 2023-05-19 ENCOUNTER — Ambulatory Visit: Payer: Self-pay | Admitting: Cardiology

## 2023-05-19 ENCOUNTER — Encounter (INDEPENDENT_AMBULATORY_CARE_PROVIDER_SITE_OTHER): Admitting: Ophthalmology

## 2023-05-19 DIAGNOSIS — H35051 Retinal neovascularization, unspecified, right eye: Secondary | ICD-10-CM | POA: Diagnosis not present

## 2023-05-19 DIAGNOSIS — Z794 Long term (current) use of insulin: Secondary | ICD-10-CM

## 2023-05-19 DIAGNOSIS — Z7984 Long term (current) use of oral hypoglycemic drugs: Secondary | ICD-10-CM

## 2023-05-19 DIAGNOSIS — Z7985 Long-term (current) use of injectable non-insulin antidiabetic drugs: Secondary | ICD-10-CM

## 2023-05-19 DIAGNOSIS — H4312 Vitreous hemorrhage, left eye: Secondary | ICD-10-CM | POA: Diagnosis not present

## 2023-05-19 DIAGNOSIS — H35033 Hypertensive retinopathy, bilateral: Secondary | ICD-10-CM

## 2023-05-19 DIAGNOSIS — I10 Essential (primary) hypertension: Secondary | ICD-10-CM

## 2023-05-19 DIAGNOSIS — E113593 Type 2 diabetes mellitus with proliferative diabetic retinopathy without macular edema, bilateral: Secondary | ICD-10-CM

## 2023-05-19 DIAGNOSIS — H43813 Vitreous degeneration, bilateral: Secondary | ICD-10-CM

## 2023-05-19 DIAGNOSIS — H2513 Age-related nuclear cataract, bilateral: Secondary | ICD-10-CM

## 2023-05-28 ENCOUNTER — Ambulatory Visit: Payer: Self-pay

## 2023-06-02 ENCOUNTER — Encounter (INDEPENDENT_AMBULATORY_CARE_PROVIDER_SITE_OTHER): Admitting: Ophthalmology

## 2023-06-02 DIAGNOSIS — Z7984 Long term (current) use of oral hypoglycemic drugs: Secondary | ICD-10-CM

## 2023-06-02 DIAGNOSIS — Z794 Long term (current) use of insulin: Secondary | ICD-10-CM

## 2023-06-02 DIAGNOSIS — H4312 Vitreous hemorrhage, left eye: Secondary | ICD-10-CM

## 2023-06-02 DIAGNOSIS — H26492 Other secondary cataract, left eye: Secondary | ICD-10-CM | POA: Diagnosis not present

## 2023-06-02 DIAGNOSIS — Z7985 Long-term (current) use of injectable non-insulin antidiabetic drugs: Secondary | ICD-10-CM

## 2023-06-02 DIAGNOSIS — E113512 Type 2 diabetes mellitus with proliferative diabetic retinopathy with macular edema, left eye: Secondary | ICD-10-CM

## 2023-06-15 ENCOUNTER — Encounter: Payer: Self-pay | Admitting: Cardiology

## 2023-06-15 ENCOUNTER — Telehealth: Payer: Self-pay | Admitting: Cardiology

## 2023-06-15 NOTE — Telephone Encounter (Signed)
-----   Message from Nurse Armond Lands sent at 05/25/2023  8:10 AM EDT ----- Regarding: RE: May f/u appt Go ahead and just place her in the late June appt slot. She was a 10 month f/u anyway so we are still okay to push it late June. Thanks for checking!  Lonni Robert, RN ----- Message ----- From: Alvis Jourdain Sent: 05/22/2023  10:55 AM EDT To: Roxianne Coral, RN Subject: RE: May f/u appt                               OK to double book or overbook on one of his quarter days? His next available isnt until late June. ----- Message ----- From: Roxianne Coral, RN Sent: 05/21/2023   8:20 AM EDT To: Alvis Jourdain Subject: May f/u appt                                   Alean Amen! This pt needs a f/u appt this month if possible or the soonest appt possible in June. Thanks!  Lonni Robert, RN

## 2023-06-15 NOTE — Telephone Encounter (Signed)
 Patient contacted several times to schedule f/u in June with Dr. Albert Huff. Will send letter via mychart

## 2023-06-16 ENCOUNTER — Encounter (INDEPENDENT_AMBULATORY_CARE_PROVIDER_SITE_OTHER): Admitting: Ophthalmology

## 2023-06-23 ENCOUNTER — Encounter (INDEPENDENT_AMBULATORY_CARE_PROVIDER_SITE_OTHER): Admitting: Ophthalmology

## 2023-06-23 DIAGNOSIS — H35033 Hypertensive retinopathy, bilateral: Secondary | ICD-10-CM

## 2023-06-23 DIAGNOSIS — H43813 Vitreous degeneration, bilateral: Secondary | ICD-10-CM

## 2023-06-23 DIAGNOSIS — H4312 Vitreous hemorrhage, left eye: Secondary | ICD-10-CM

## 2023-06-23 DIAGNOSIS — Z7985 Long-term (current) use of injectable non-insulin antidiabetic drugs: Secondary | ICD-10-CM

## 2023-06-23 DIAGNOSIS — I10 Essential (primary) hypertension: Secondary | ICD-10-CM

## 2023-06-23 DIAGNOSIS — E113593 Type 2 diabetes mellitus with proliferative diabetic retinopathy without macular edema, bilateral: Secondary | ICD-10-CM

## 2023-06-23 DIAGNOSIS — Z7984 Long term (current) use of oral hypoglycemic drugs: Secondary | ICD-10-CM | POA: Diagnosis not present

## 2023-06-23 DIAGNOSIS — Z794 Long term (current) use of insulin: Secondary | ICD-10-CM

## 2023-06-30 ENCOUNTER — Ambulatory Visit: Admitting: Endocrinology

## 2023-07-28 ENCOUNTER — Ambulatory Visit: Attending: Cardiology | Admitting: Cardiology

## 2023-07-28 ENCOUNTER — Encounter: Payer: Self-pay | Admitting: Cardiology

## 2023-07-28 VITALS — BP 120/84 | HR 84 | Resp 16 | Ht 62.0 in | Wt 158.4 lb

## 2023-07-28 DIAGNOSIS — Z794 Long term (current) use of insulin: Secondary | ICD-10-CM

## 2023-07-28 DIAGNOSIS — E1169 Type 2 diabetes mellitus with other specified complication: Secondary | ICD-10-CM

## 2023-07-28 DIAGNOSIS — I10 Essential (primary) hypertension: Secondary | ICD-10-CM

## 2023-07-28 DIAGNOSIS — I2510 Atherosclerotic heart disease of native coronary artery without angina pectoris: Secondary | ICD-10-CM | POA: Diagnosis not present

## 2023-07-28 DIAGNOSIS — I700 Atherosclerosis of aorta: Secondary | ICD-10-CM

## 2023-07-28 DIAGNOSIS — I059 Rheumatic mitral valve disease, unspecified: Secondary | ICD-10-CM | POA: Diagnosis not present

## 2023-07-28 DIAGNOSIS — I351 Nonrheumatic aortic (valve) insufficiency: Secondary | ICD-10-CM

## 2023-07-28 DIAGNOSIS — R931 Abnormal findings on diagnostic imaging of heart and coronary circulation: Secondary | ICD-10-CM

## 2023-07-28 DIAGNOSIS — E1165 Type 2 diabetes mellitus with hyperglycemia: Secondary | ICD-10-CM

## 2023-07-28 DIAGNOSIS — I2584 Coronary atherosclerosis due to calcified coronary lesion: Secondary | ICD-10-CM

## 2023-07-28 DIAGNOSIS — E785 Hyperlipidemia, unspecified: Secondary | ICD-10-CM

## 2023-07-28 NOTE — Patient Instructions (Signed)
 Medication Instructions:  No medication changes were made at this visit. Continue current regimen.   *If you need a refill on your cardiac medications before your next appointment, please call your pharmacy*  Lab Work: None ordered today. If you have labs (blood work) drawn today and your tests are completely normal, you will receive your results only by: MyChart Message (if you have MyChart) OR A paper copy in the mail If you have any lab test that is abnormal or we need to change your treatment, we will call you to review the results.  Testing/Procedures: Your physician has requested that you have an echocardiogram in July 2026. Echocardiography is a painless test that uses sound waves to create images of your heart. It provides your doctor with information about the size and shape of your heart and how well your heart's chambers and valves are working. This procedure takes approximately one hour. There are no restrictions for this procedure. Please do NOT wear cologne, perfume, aftershave, or lotions (deodorant is allowed). Please arrive 15 minutes prior to your appointment time.  Please note: We ask at that you not bring children with you during ultrasound (echo/ vascular) testing. Due to room size and safety concerns, children are not allowed in the ultrasound rooms during exams. Our front office staff cannot provide observation of children in our lobby area while testing is being conducted. An adult accompanying a patient to their appointment will only be allowed in the ultrasound room at the discretion of the ultrasound technician under special circumstances. We apologize for any inconvenience.   Follow-Up: At [facility], you and your health needs are our priority.  As part of our continuing mission to provide you with exceptional heart care, our providers are all part of one team.  This team includes your primary Cardiologist (physician) and Advanced Practice Providers or APPs  (Physician Assistants and Nurse Practitioners) who all work together to provide you with the care you need, when you need it.  Your next appointment:   August 2026  Provider:   Madonna Large, DO

## 2023-07-28 NOTE — Progress Notes (Signed)
 Cardiology Office Note:  .   Date:  07/28/2023  ID:  Veronica Fletcher, DOB January 19, 1965, MRN [id number] PCP:  Okey Carlin Redbird, MD  Former Cardiology Providers: None [facility] Providers Cardiologist:  Madonna Large, DO , [facility] (established care 04/08/2022) Electrophysiologist:  None  Click to update primary MD,subspecialty MD or APP then REFRESH:1}    Chief Complaint  Patient presents with   Mitral valve disease   Follow-up    History of Present Illness: .   Veronica Fletcher is a 58 y.o. African-American female whose past medical history and cardiovascular risk factors includes: Moderate CAC (total CAC 108, 95th percentile), Aortic atherosclerosis, regurgitation, mitral stenosis, hypertension, hyperlipidemia, insulin -dependent diabetes mellitus type 2.   Patient was referred to the practice for evaluation of a cardiac murmur and was noted to have at least moderate mitral stenosis.  In addition, given her cardiovascular risk factors did have a coronary calcium  score which noted moderate CAC placing her at the 95th percentile.  Patient was started on lipid-lowering agents and also underwent stress test which was reported to be low risk.  Patient presents today for 1 year follow-up visit.  She had an echocardiogram in April 2025 which noted preserved LVEF, no regional wall motion abnormalities, grade 1 diastolic dysfunction, right ventricular size and function preserved, severely dilated left atrium, moderate mitral stenosis with a mean gradient reported to be 13 mmHg and a mitral valve area of 1.48 cm by VTI, moderate AI, estimated RAP 8 mmHg.  Clinically denies anginal chest pain or heart failure symptoms. She denies palpitations, near-syncope or syncopal events.   Review of Systems: .   Review of Systems  Cardiovascular:  Negative for chest pain, claudication, irregular heartbeat, leg swelling, near-syncope, orthopnea, palpitations, paroxysmal nocturnal dyspnea and syncope.   Respiratory:  Negative for shortness of breath.   Hematologic/Lymphatic: Negative for bleeding problem.    Studies Reviewed:   EKG: EKG Interpretation Date/Time:  Tuesday July 28 2023 08:29:38 EDT Ventricular Rate:  84 PR Interval:  220 QRS Duration:  56 QT Interval:  346 QTC Calculation: 408 R Axis:   50  Text Interpretation: Sinus rhythm with 1st degree A-V block Right atrial enlargement When compared with ECG of 12-Oct-2018 09:06, No significant change was found Confirmed by Large Madonna [id number]) on 07/28/2023 8:41:24 AM  Echocardiogram: 05/12/2023   1. Left ventricular ejection fraction, by estimation, is 60 to 65%. Left ventricular ejection fraction by 3D volume is 63 %. The left ventricle has normal function. The left ventricle has no regional wall motion abnormalities. Left ventricular diastolic  parameters are consistent with Grade I diastolic dysfunction (impaired relaxation). The average left ventricular global longitudinal strain is -19.5 %. The global longitudinal strain is normal.  2. Right ventricular systolic function is normal. The right ventricular size is normal. There is normal pulmonary artery systolic pressure. The estimated right ventricular systolic pressure is 31.2 mmHg.  3. Left atrial size was severely dilated.  4. The mitral valve is degenerative. Trivial mitral valve regurgitation. Moderate mitral stenosis. The mean mitral valve gradient is 13.0 mmHg with MVA 1.48 cm^2 by VTI. Moderate mitral annular calcification with mild leaflet calcification. Mechanism of  mitral stenosis is not clearly rheumatic (leaflets do not appear clearly rheumatic). Would suggest TEE for better assessment of mitral valve.  5. The aortic valve is tricuspid. There is mild calcification of the aortic valve. Aortic valve regurgitation is moderate. No holodiastolic flow reversal in descending thoracic aorta doppler pattern. No aortic stenosis is present.  6. The inferior vena cava is  dilated in size with >50% respiratory variability, suggesting right atrial pressure of 8 mmHg.  Stress Testing: Exercise nuclear stress test 06/05/2022 Myocardial perfusion is normal. Low risk study. See report for additional details  CT Cardiac Scoring: May 01, 2022 1. Coronary calcium  score of 108. This was 95 percentile for age-,race-, and sex-matched controls.  2. Mitral annular calcification  Noncardiac findings: Aortic Atherosclerosis (ICD10-I70.0).   RADIOLOGY: None  Risk Assessment/Calculations:   NA   Labs:       Latest Ref Rng & Units 07/09/2019    5:06 AM 07/05/2019   10:15 AM 10/15/2018    3:11 AM  CBC  WBC 4.0 - 10.5 K/uL 15.2  8.0  11.2   Hemoglobin 12.0 - 15.0 g/dL 87.0  86.0  88.4   Hematocrit 36.0 - 46.0 % 39.7  45.6  35.0   Platelets 150 - 400 K/uL 190  178  PLATELET CLUMPS NOTED ON SMEAR, UNABLE TO ESTIMATE        Latest Ref Rng & Units 04/02/2023    8:16 AM 12/02/2022    8:26 AM 07/22/2022    8:18 AM  BMP  Glucose 65 - 99 mg/dL 868  868  78   BUN 7 - 25 mg/dL 21  11  16    Creatinine 0.50 - 1.03 mg/dL 9.22  9.31  9.27   BUN/Creat Ratio 6 - 22 (calc) SEE NOTE:     Sodium 135 - 146 mmol/L 138  142  142   Potassium 3.5 - 5.3 mmol/L 3.6  4.1  3.7   Chloride 98 - 110 mmol/L 103  105  104   CO2 20 - 32 mmol/L 25  28  29    Calcium  8.6 - 10.4 mg/dL 9.7  9.4  9.6       Latest Ref Rng & Units 04/02/2023    8:16 AM 12/02/2022    8:26 AM 07/22/2022    8:18 AM  CMP  Glucose 65 - 99 mg/dL 868  868  78   BUN 7 - 25 mg/dL 21  11  16    Creatinine 0.50 - 1.03 mg/dL 9.22  9.31  9.27   Sodium 135 - 146 mmol/L 138  142  142   Potassium 3.5 - 5.3 mmol/L 3.6  4.1  3.7   Chloride 98 - 110 mmol/L 103  105  104   CO2 20 - 32 mmol/L 25  28  29    Calcium  8.6 - 10.4 mg/dL 9.7  9.4  9.6   Total Protein 6.0 - 8.3 g/dL   7.0   Total Bilirubin 0.2 - 1.2 mg/dL   0.7   Alkaline Phos 39 - 117 U/L   83   AST 0 - 37 U/L   12   ALT 0 - 35 U/L   9     Lab Results   Component Value Date   CHOL 141 07/22/2022   HDL 53.10 07/22/2022   LDLCALC 75 07/22/2022   LDLDIRECT 75.0 03/22/2018   TRIG 64.0 07/22/2022   CHOLHDL 3 07/22/2022   No results for input(s): LIPOA in the last 8760 hours. No components found for: NTPROBNP No results for input(s): PROBNP in the last 8760 hours. No results for input(s): TSH in the last 8760 hours.  Physical Exam:    Today's Vitals   07/28/23 0825  BP: 120/84  Pulse: 84  Resp: 16  SpO2: 99%  Weight: 158 lb 6.4 oz (71.8 kg)  Height: 5' 2 (1.575 m)   Body mass index is 28.97 kg/m. Wt Readings from Last 3 Encounters:  07/28/23 158 lb 6.4 oz (71.8 kg)  04/08/23 148 lb (67.1 kg)  12/09/22 156 lb (70.8 kg)    Physical Exam  Constitutional: No distress.  hemodynamically stable  Neck: No JVD present.  Cardiovascular: Normal rate, regular rhythm, S1 normal, S2 normal, intact distal pulses and normal pulses. Exam reveals no gallop, no S3 and no S4.  No murmur heard. Pulmonary/Chest: Effort normal and breath sounds normal. No stridor. She has no wheezes. She has no rales.  Abdominal: Soft. Bowel sounds are normal. She exhibits no distension. There is no abdominal tenderness.  Musculoskeletal:        General: No edema.     Cervical back: Neck supple.  Neurological: She is alert and oriented to person, place, and time. She has intact cranial nerves (2-12).  Skin: Skin is warm and moist.     Impression & Recommendation(s):  Impression:   ICD-10-CM   1. Mitral valve disease  I05.9 EKG 12-Lead    ECHOCARDIOGRAM COMPLETE    2. Nonrheumatic aortic valve insufficiency  I35.1     3. Agatston coronary artery calcium  score between 100 and 400  R93.1     4. Coronary atherosclerosis due to calcified coronary lesion  I25.10    I25.84     5. Atherosclerosis of aorta (HCC)  I70.0     6. Benign hypertension  I10     7. Mixed hyperlipidemia  E78.2     8. Type 2 diabetes mellitus with hyperglycemia, with  long-term current use of insulin  (HCC)  E11.65    Z79.4     9. Type 2 diabetes mellitus with hyperlipidemia (HCC)  E11.69    E78.5        Recommendation(s):  Mitral valve disease/aortic regurgitation Clinically asymptomatic. Most recent echocardiogram May 12, 2023 notes moderate mitral stenosis and moderate aortic regurgitation We discussed conservative management as well as proceeding forward with a transesophageal echocardiogram to further evaluate valvular heart disease. Shared decision was to hold off on additional testing at this time as clinically she is asymptomatic. Patient is asked to come in sooner if she develops symptoms of palpitations, near-syncope, syncope, or heart failure symptoms.  Patient agreeable with the plan of care No history of rheumatic disease 1 year follow-up echo July 2026  Agatston coronary artery calcium  score between 100 and 400 Coronary atherosclerosis due to calcified coronary lesion Atherosclerosis of aorta (HCC) Total CAC 108, 95th percentile.  Currently on aspirin  81 mg p.o. daily. Currently on Crestor  20 mg p.o. nightly Has undergone appropriate ischemic workup in the past. Overall functional capacity remains stable  Benign hypertension Office blood pressures are well-controlled. Continue Farxiga , ramipril  Reemphasized importance of low-salt diet.  Type 2 diabetes mellitus with hyperlipidemia (HCC) Currently on Crestor  20 mg p.o. daily.   She denies myalgia or other side effects. Most recent lipids dated 07/22/2022, independently reviewed via [facility] database.  LDL is 75 mg/dL.  Cardiology is following peripherally.  Type 2 diabetes mellitus with hyperglycemia, with long-term current use of insulin  (HCC) Reemphasized importance of glycemic control. Currently on ACE inhibitor's, statin therapy, Ozempic , Farxiga   Orders Placed:  Orders Placed This Encounter  Procedures   EKG 12-Lead   ECHOCARDIOGRAM COMPLETE    Standing Status:   Future     Expected Date:   07/27/2024    Where should this test be performed[facility]  Does the patient weigh less than or greater than 250 lbs?:   Patient weighs less than 250 lbs    Perflutren DEFINITY (image enhancing agent) should be administered unless hypersensitivity or allergy exist:   Administer Perflutren    Reason for exam-Echo:   Other-Full Diagnosis List    Full ICD-10/Reason for Exam:   Mitral valve disorder [[id number]]     Final Medication List:   No orders of the defined types were placed in this encounter.   There are no discontinued medications.   Current Outpatient Medications:    Accu-Chek FastClix Lancets MISC, Use Accu Chek Fastclix lancets to check blood sugar twice daily., Disp: 204 each, Rfl: 3   albuterol  (VENTOLIN  HFA) 108 (90 Base) MCG/ACT inhaler, Inhale 1-2 puffs into the lungs every 4 (four) hours as needed for wheezing or shortness of breath., Disp: , Rfl:    aspirin  EC 81 MG tablet, Take 81 mg by mouth daily. Swallow whole., Disp: , Rfl:    Blood Glucose Monitoring Suppl (ACCU-CHEK GUIDE) w/Device KIT, 1 each by Does not apply route 2 (two) times a day. Use as instructed to check blood sugar twice daily., Disp: 1 kit, Rfl: 0   Continuous Blood Gluc Sensor (FREESTYLE LIBRE 3 SENSOR) MISC, 1 Device by Does not apply route every 14 (fourteen) days. Apply 1 sensor on upper arm every 14 days for continuous glucose monitoring, Disp: 2 each, Rfl: 3   Continuous Glucose Sensor (FREESTYLE LIBRE 3 PLUS SENSOR) MISC, 1 each by Does not apply route continuous. Change every 15 days., Disp: 6 each, Rfl: 3   dapagliflozin  propanediol (FARXIGA ) 10 MG TABS tablet, Take 1 tablet (10 mg total) by mouth daily., Disp: 90 tablet, Rfl: 3   fluticasone (FLONASE) 50 MCG/ACT nasal spray, Place 1 spray into both nostrils daily as needed for allergies or rhinitis., Disp: , Rfl:    fluticasone (FLOVENT HFA) 110 MCG/ACT inhaler, Flovent HFA 110 mcg/actuation aerosol inhaler, Disp: ,  Rfl:    glucose blood test strip, 2 times a day and use as instructed, Disp: 100 each, Rfl: 12   ibuprofen  (ADVIL ) 600 MG tablet, Take 600 mg by mouth every 6 (six) hours., Disp: , Rfl:    insulin  aspart protamine - aspart (NOVOLOG  MIX 70/30 FLEXPEN) (70-30) 100 UNIT/ML FlexPen, Inject 14 Units into the skin at bedtime and 18 units into the skin in the morning daily with a meal. (Patient taking differently: Inject 18 Units into the skin at bedtime and 14 units into the skin in the morning daily with a meal.), Disp: 30 mL, Rfl: 2   Insulin  Pen Needle (B-D UF III MINI PEN NEEDLES) 31G X 5 MM MISC, 1 EACH BY DOES NOT APPLY ROUTE 2 (TWO) TIMES DAILY. USE TO INJECT INSULIN  TWICE DAILY., Disp: 100 each, Rfl: 2   metFORMIN  (GLUCOPHAGE -XR) 500 MG 24 hr tablet, Take 2 tablets (1,000 mg total) by mouth daily., Disp: 180 tablet, Rfl: 3   Naproxen Sod-diphenhydrAMINE  (ALEVE PM) 220-25 MG TABS, 2 tablets Orally at bedtime, Disp: , Rfl:    ramipril  (ALTACE ) 10 MG capsule, Take 10 mg by mouth daily., Disp: , Rfl:    rosuvastatin  (CRESTOR ) 20 MG tablet, TAKE 1 TABLET BY MOUTH EVERYDAY AT BEDTIME, Disp: 90 tablet, Rfl: 3   topiramate  (TOPAMAX ) 25 MG tablet, , Disp: , Rfl:    Semaglutide , 1 MG/DOSE, (OZEMPIC , 1 MG/DOSE,) 4 MG/3ML SOPN, INJECT 1MG  SUBCUTANEOUSLY  WEEKLY (EVERY 7 DAYS). (Patient not taking: Reported on 07/28/2023), Disp: 9 mL,  Rfl: 2  Consent:   NA  Disposition:   1 year follow-up-tentatively August 2026  Her questions and concerns were addressed to her satisfaction. She voices understanding of the recommendations provided during this encounter.    Signed, Madonna Michele HAS, [facility]  A Division of [facility] [address], [place], [place] [address]  [place], [place] [address] 07/28/2023 12:11 PM

## 2023-08-04 DIAGNOSIS — Z01419 Encounter for gynecological examination (general) (routine) without abnormal findings: Secondary | ICD-10-CM | POA: Diagnosis not present

## 2023-08-18 ENCOUNTER — Encounter: Payer: Self-pay | Admitting: Endocrinology

## 2023-08-18 ENCOUNTER — Ambulatory Visit (INDEPENDENT_AMBULATORY_CARE_PROVIDER_SITE_OTHER): Admitting: Endocrinology

## 2023-08-18 ENCOUNTER — Ambulatory Visit: Payer: Self-pay | Admitting: Endocrinology

## 2023-08-18 VITALS — BP 110/62 | HR 85 | Resp 16 | Ht 62.0 in

## 2023-08-18 DIAGNOSIS — E1169 Type 2 diabetes mellitus with other specified complication: Secondary | ICD-10-CM | POA: Diagnosis not present

## 2023-08-18 DIAGNOSIS — Z794 Long term (current) use of insulin: Secondary | ICD-10-CM

## 2023-08-18 DIAGNOSIS — E11319 Type 2 diabetes mellitus with unspecified diabetic retinopathy without macular edema: Secondary | ICD-10-CM

## 2023-08-18 LAB — POCT GLYCOSYLATED HEMOGLOBIN (HGB A1C): Hemoglobin A1C: 6.6 % — AB (ref 4.0–5.6)

## 2023-08-18 MED ORDER — FREESTYLE LIBRE 3 PLUS SENSOR MISC: 1.0000 | 3 refills | Status: AC

## 2023-08-18 MED ORDER — OZEMPIC (0.25 OR 0.5 MG/DOSE) 2 MG/3ML ~~LOC~~ SOPN: PEN_INJECTOR | SUBCUTANEOUS | 4 refills | Status: DC

## 2023-08-18 NOTE — Progress Notes (Signed)
 Outpatient Endocrinology Note Veronica Philomina Leon, MD  08/18/23  Patient's Name: Veronica Fletcher    DOB: Feb 13, 1965    MRN: [id number]                                                    REASON OF VISIT: Follow up for type 2 diabetes mellitus  PCP: Okey Carlin Redbird, MD  HISTORY OF PRESENT ILLNESS:   Veronica Fletcher is a 58 y.o. old female with past medical history listed below, is here for follow up for type 2 diabetes mellitus.   Pertinent Diabetes History: Patient was previously and was last seen by Dr. Von in July 2024.  Patient was diagnosed with type 2 diabetes mellitus in 2005.  In 2015 patient was hospitalized due to abdominal pain presumed to be pancreatitis and thought to be related with Victoza.  Insulin  therapy was started in 2015.  H/o pancreatitis , ? Victoza in 2015.   Chronic Diabetes Complications : Retinopathy: yes. Last ophthalmology exam was done, following with regularly with retina specialist and ophthalmology. Nephropathy: no, on ACE/ARB /ramipril /Farxiga  Peripheral neuropathy: no Coronary artery disease: no Stroke: no  Relevant comorbidities and cardiovascular risk factors: Obesity: no Body mass index is 28.97 kg/m.  Hypertension: Yes  Hyperlipidemia : Yes, on statin   Current / Home Diabetic regimen includes:  INSULIN  regimen is: NovoLog  mix at meals: 10-18 units AM - 18 units  before dinner   Non-insulin  hypoglycemic drugs: Farxiga  10 mg daily, metformin  ER 1000 mg daily.  Ozempic  1.0 mg weekly ( not taking)  Discussed the risk of acute pancreatitis as a side effect from use of Ozempic  as she had history of pancreatitis from Victoza in 2015.  Patient would like to continue on Ozempic .  Prior diabetic medications: V-go pump 20 unit basal and boluses 4 units previously stopped. Victoza, stopped due to?  Pancreatitis. Jardiance  in the past.  Glycemic data:  Accu-Chek guide me glucose meter download from July 22 to August 18, 2023 average blood sugar  161.  Lowest blood sugar 100, highest blood sugar 219.  Blood sugar 172, 137, 100, 219, 199.  Checking at different times of the day.  Hypoglycemia: Patient has no hypoglycemic episodes. Patient has hypoglycemia awareness.  Factors modifying glucose control: 1.  Diabetic diet assessment: 3 meals a day.  She is trying to limit portion.  2.  Staying active or exercising:   3.  Medication compliance: compliant all of the time.  Interval history  Glucometer data as noted above, mild hyperglycemia.  Diabetes overnight reviewed and noted above.  She has not been taking Ozempic  since her last appointment.  She discussed with ophthalmology and it is okay to resume Ozempic  in regard to having diabetic retinopathy.  She had laser treatment for the eyes and regularly following with ophthalmology.  Denies numbness and tingling of the feet.    Hemoglobin A1c 6.6% today.  She gained mild weight.  REVIEW OF SYSTEMS As per history of present illness.   PAST MEDICAL HISTORY: Past Medical History:  Diagnosis Date   Anemia    Asthma    Diabetes mellitus    Family history of adverse reaction to anesthesia    Brother   Heart murmur    Hyperlipidemia    Hypertension    Pancreatitis    Pneumonia     PAST  SURGICAL HISTORY: Past Surgical History:  Procedure Laterality Date   ABDOMINAL HYSTERECTOMY     ANTERIOR LAT LUMBAR FUSION N/A 07/07/2019   Procedure: ANTERIOR LATERAL LUMBAR FUSION (XLIF) LUMBAR TWO THROUGH LUMBAR THREE, POSTERIOR SPINAL FUSION INTERBODY LUMBAR TWO THROUGH LUMBAR THREE;  Surgeon: Burnetta Aures, MD;  Location: [facility] OR;  Service: Orthopedics;  Laterality: N/A;  4 HRS   CESAREAN SECTION     CHOLECYSTECTOMY     GANGLION CYST EXCISION     HIP CAPSULECTOMY Right 10/14/2018   Procedure: RIGHT HIP HETEROTOPIC OSSIFICATION RESECTION;  Surgeon: Kendal Franky SQUIBB, MD;  Location: [facility] OR;  Service: Orthopedics;  Laterality: Right;   LAPAROSCOPY     LUMBAR FUSION       ALLERGIES: Allergies  Allergen Reactions   Liraglutide Other (See Comments)    Other reaction(s): Pancreatitis  *Victoza     FAMILY HISTORY:  Family History  Problem Relation Age of Onset   Glaucoma Mother    Diabetes Mother    Hypertension Mother    Stroke Mother    Glaucoma Brother    Melanoma Brother    Hypertension Brother    Diabetes Maternal Aunt    Diabetes Paternal Aunt    Diabetes Maternal Grandmother    Heart disease Neg Hx     SOCIAL HISTORY: Social History   Socioeconomic History   Marital status: Divorced    Spouse name: Not on file   Number of children: 1   Years of education: Not on file   Highest education level: Not on file  Occupational History   Not on file  Tobacco Use   Smoking status: Some Days    Types: Cigars   Smokeless tobacco: Never  Vaping Use   Vaping status: Never Used  Substance and Sexual Activity   Alcohol use: Yes    Comment: occasionally   Drug use: No   Sexual activity: Not Currently  Other Topics Concern   Not on file  Social History Narrative   Not on file   Social Drivers of Health   Financial Resource Strain: Not on file  Food Insecurity: Not on file  Transportation Needs: Not on file  Physical Activity: Not on file  Stress: Not on file  Social Connections: Not on file    MEDICATIONS:  Current Outpatient Medications  Medication Sig Dispense Refill   Accu-Chek FastClix Lancets MISC Use Accu Chek Fastclix lancets to check blood sugar twice daily. 204 each 3   albuterol  (VENTOLIN  HFA) 108 (90 Base) MCG/ACT inhaler Inhale 1-2 puffs into the lungs every 4 (four) hours as needed for wheezing or shortness of breath.     aspirin  EC 81 MG tablet Take 81 mg by mouth daily. Swallow whole.     Blood Glucose Monitoring Suppl (ACCU-CHEK GUIDE) w/Device KIT 1 each by Does not apply route 2 (two) times a day. Use as instructed to check blood sugar twice daily. 1 kit 0   Continuous Glucose Sensor (FREESTYLE LIBRE 3 PLUS  SENSOR) MISC 1 each by Does not apply route continuous. Change every 15 days. 6 each 3   dapagliflozin  propanediol (FARXIGA ) 10 MG TABS tablet Take 1 tablet (10 mg total) by mouth daily. 90 tablet 3   fluticasone (FLONASE) 50 MCG/ACT nasal spray Place 1 spray into both nostrils daily as needed for allergies or rhinitis.     fluticasone (FLOVENT HFA) 110 MCG/ACT inhaler Flovent HFA 110 mcg/actuation aerosol inhaler     glucose blood test strip 2 times a day  and use as instructed 100 each 12   ibuprofen  (ADVIL ) 600 MG tablet Take 600 mg by mouth every 6 (six) hours.     insulin  aspart protamine - aspart (NOVOLOG  MIX 70/30 FLEXPEN) (70-30) 100 UNIT/ML FlexPen Inject 14 Units into the skin at bedtime and 18 units into the skin in the morning daily with a meal. (Patient taking differently: Inject 18 Units into the skin at bedtime and 14 units into the skin in the morning daily with a meal.) 30 mL 2   Insulin  Pen Needle (B-D UF III MINI PEN NEEDLES) 31G X 5 MM MISC 1 EACH BY DOES NOT APPLY ROUTE 2 (TWO) TIMES DAILY. USE TO INJECT INSULIN  TWICE DAILY. 100 each 2   metFORMIN  (GLUCOPHAGE -XR) 500 MG 24 hr tablet Take 2 tablets (1,000 mg total) by mouth daily. 180 tablet 3   Naproxen Sod-diphenhydrAMINE  (ALEVE PM) 220-25 MG TABS 2 tablets Orally at bedtime     ramipril  (ALTACE ) 10 MG capsule Take 10 mg by mouth daily.     rosuvastatin  (CRESTOR ) 20 MG tablet TAKE 1 TABLET BY MOUTH EVERYDAY AT BEDTIME 90 tablet 3   Semaglutide ,0.25 or 0.5MG /DOS, (OZEMPIC , 0.25 OR 0.5 MG/DOSE,) 2 MG/3ML SOPN Take 0.25 mg weekly for 4 weeks and increase to 0.5 mg weekly. 3 mL 4   topiramate  (TOPAMAX ) 25 MG tablet      No current facility-administered medications for this visit.    PHYSICAL EXAM: Vitals:   08/18/23 0834  BP: 110/62  Pulse: 85  Resp: 16  SpO2: 99%  Height: 5' 2 (1.575 m)    Body mass index is 28.97 kg/m.  Wt Readings from Last 3 Encounters:  07/28/23 158 lb 6.4 oz (71.8 kg)  04/08/23 148 lb (67.1 kg)   12/09/22 156 lb (70.8 kg)    General: Well developed, well nourished female in no apparent distress.  HEENT: AT/[place], no external lesions.  Eyes: Conjunctiva clear and no icterus. Neck: Neck supple  Lungs: Respirations not labored Neurologic: Alert, oriented, normal speech Extremities / Skin: Dry.   Psychiatric: Does not appear depressed or anxious  Diabetic Foot Exam - Simple   Simple Foot Form Diabetic Foot exam was performed with the following findings: Yes 08/18/2023  8:44 AM  Visual Inspection No deformities, no ulcerations, no other skin breakdown bilaterally: Yes Sensation Testing Intact to touch and monofilament testing bilaterally: Yes Pulse Check Posterior Tibialis and Dorsalis pulse intact bilaterally: Yes Comments    LABS Reviewed Lab Results  Component Value Date   HGBA1C 6.6 (A) 08/18/2023   HGBA1C 6.4 (H) 04/02/2023   HGBA1C 7.0 (H) 12/02/2022   Lab Results  Component Value Date   FRUCTOSAMINE 293 (H) 03/22/2018   FRUCTOSAMINE 250 08/12/2017   FRUCTOSAMINE 290 (H) 06/16/2017   Lab Results  Component Value Date   CHOL 141 07/22/2022   HDL 53.10 07/22/2022   LDLCALC 75 07/22/2022   LDLDIRECT 75.0 03/22/2018   TRIG 64.0 07/22/2022   CHOLHDL 3 07/22/2022   Lab Results  Component Value Date   MICRALBCREAT 2 04/02/2023   Lab Results  Component Value Date   CREATININE 0.77 04/02/2023   Lab Results  Component Value Date   GFR 96.79 12/02/2022    ASSESSMENT / PLAN  1. Controlled type 2 diabetes mellitus with other specified complication, with long-term current use of insulin  (HCC)   2. Diabetic retinopathy of both eyes associated with type 2 diabetes mellitus, macular edema presence unspecified, unspecified retinopathy severity (HCC)     Diabetes  Mellitus type 2, complicated by no known complications. - Diabetic status / severity: Fair control.  Lab Results  Component Value Date   HGBA1C 6.6 (A) 08/18/2023    - Hemoglobin A1c goal :  <6.5%  - Medications: Adjusted and discussed as follows.  INSULIN  regimen is: NovoLog  mix at meals: 10-18 units a.m.-18 before dinner.  Okay to adjust couple of units based on meal size and blood sugar.   Non-insulin  hypoglycemic drugs: Farxiga  10 mg daily, metformin  ER 1000 mg daily.   Restart Ozempic , will start 0.25 mg weekly for 4 weeks and increase to 0.5 mg weekly.  Ophthalmology okay with resuming Ozempic .  - Home glucose testing: Use CGM as able, check blood sugar at least 2 times a day before meals and at bedtime.  Sent prescription for freestyle libre 3+.  She will check cost and coverage with the pharmacy. - Discussed/ Gave Hypoglycemia treatment plan.  # Consult : not required at this time.   # Annual urine for microalbuminuria/ creatinine ratio, no microalbuminuria currently, continue ACE/ARB /ramipril . Last  Lab Results  Component Value Date   MICRALBCREAT 2 04/02/2023    # Foot check nightly.  # Diabetic retinopathy, regular follow-up with ophthalmology and retina specialist.  - Diet: Make healthy diabetic food choices - Life style / activity / exercise: Discussed.  2. Blood pressure  -  BP Readings from Last 1 Encounters:  08/18/23 110/62    - Control is in target.  - No change in current plans.  3. Lipid status / Hyperlipidemia - Last  Lab Results  Component Value Date   LDLCALC 75 07/22/2022   - Continue rosuvastatin  20 mg daily.  Managed by cardiology.  Diagnoses and all orders for this visit:  Controlled type 2 diabetes mellitus with other specified complication, with long-term current use of insulin  (HCC) -     POCT glycosylated hemoglobin (Hb A1C) -     Semaglutide ,0.25 or 0.5MG /DOS, (OZEMPIC , 0.25 OR 0.5 MG/DOSE,) 2 MG/3ML SOPN; Take 0.25 mg weekly for 4 weeks and increase to 0.5 mg weekly. -     Continuous Glucose Sensor (FREESTYLE LIBRE 3 PLUS SENSOR) MISC; 1 each by Does not apply route continuous. Change every 15 days.  Diabetic  retinopathy of both eyes associated with type 2 diabetes mellitus, macular edema presence unspecified, unspecified retinopathy severity (HCC)    DISPOSITION Follow up in clinic in 3 months suggested.    All questions answered and patient verbalized understanding of the plan.  Veronica Railey Glad, MD [facility] Endocrinology [facility] [address] [place], [address] [place], [place] [address] Phone # [phone]  At least part of this note was generated using voice recognition software. Inadvertent word errors may have occurred, which were not recognized during the proofreading process.

## 2023-09-22 ENCOUNTER — Encounter (INDEPENDENT_AMBULATORY_CARE_PROVIDER_SITE_OTHER): Admitting: Ophthalmology

## 2023-10-20 ENCOUNTER — Encounter (INDEPENDENT_AMBULATORY_CARE_PROVIDER_SITE_OTHER): Admitting: Ophthalmology

## 2023-11-18 ENCOUNTER — Encounter: Payer: Self-pay | Admitting: Endocrinology

## 2023-11-18 ENCOUNTER — Ambulatory Visit: Payer: Self-pay | Admitting: Endocrinology

## 2023-11-18 ENCOUNTER — Ambulatory Visit (INDEPENDENT_AMBULATORY_CARE_PROVIDER_SITE_OTHER): Admitting: Endocrinology

## 2023-11-18 VITALS — BP 132/70 | HR 88 | Resp 16 | Ht 62.0 in | Wt 164.6 lb

## 2023-11-18 DIAGNOSIS — E119 Type 2 diabetes mellitus without complications: Secondary | ICD-10-CM | POA: Diagnosis not present

## 2023-11-18 DIAGNOSIS — Z794 Long term (current) use of insulin: Secondary | ICD-10-CM | POA: Diagnosis not present

## 2023-11-18 DIAGNOSIS — E118 Type 2 diabetes mellitus with unspecified complications: Secondary | ICD-10-CM | POA: Diagnosis not present

## 2023-11-18 LAB — POCT GLYCOSYLATED HEMOGLOBIN (HGB A1C): HbA1c POC (<> result, manual entry): 6.5 % (ref 4.0–5.6)

## 2023-11-18 MED ORDER — SEMAGLUTIDE (1 MG/DOSE) 4 MG/3ML ~~LOC~~ SOPN: 1.0000 mg | PEN_INJECTOR | SUBCUTANEOUS | 3 refills | Status: AC

## 2023-11-18 MED ORDER — DAPAGLIFLOZIN PROPANEDIOL 10 MG PO TABS: 10.0000 mg | ORAL_TABLET | Freq: Every day | ORAL | 3 refills | Status: AC

## 2023-11-18 NOTE — Progress Notes (Signed)
 Outpatient Endocrinology Note Herndon Grill, MD  11/18/23  Patient's Name: Veronica Fletcher    DOB: 19-Jun-1965    MRN: [id number]                                                    REASON OF VISIT: Follow up for type 2 diabetes mellitus  PCP: Okey Carlin Redbird, MD  HISTORY OF PRESENT ILLNESS:   Veronica Fletcher is a 58 y.o. old female with past medical history listed below, is here for follow up for type 2 diabetes mellitus.   Pertinent Diabetes History: Patient was previously and was last seen by Dr. Von in July 2024.  Patient was diagnosed with type 2 diabetes mellitus in 2005.  In 2015 patient was hospitalized due to abdominal pain presumed to be pancreatitis and thought to be related with Victoza.  Insulin  therapy was started in 2015.  H/o pancreatitis , ? Victoza in 2015.   Chronic Diabetes Complications : Retinopathy: yes. Last ophthalmology exam was done, following with regularly with retina specialist and ophthalmology. Nephropathy: no, on ACE/ARB /ramipril /Farxiga  Peripheral neuropathy: no Coronary artery disease: no Stroke: no  Relevant comorbidities and cardiovascular risk factors: Obesity: no Body mass index is 30.11 kg/m.  Hypertension: Yes  Hyperlipidemia : Yes, on statin   Current / Home Diabetic regimen includes:  INSULIN  regimen is: NovoLog  mix at meals: 10-14 units AM - 12- 14 units  before dinner   Non-insulin  hypoglycemic drugs: Farxiga  10 mg daily, metformin  ER 1000 mg daily.  Ozempic  1.0 mg weekly ( not taking)  Discussed the risk of acute pancreatitis as a side effect from use of Ozempic  as she had history of pancreatitis from Victoza in 2015.  Patient would like to continue on Ozempic .  Prior diabetic medications: V-go pump 20 unit basal and boluses 4 units previously stopped. Victoza, stopped due to?  Pancreatitis. Jardiance  in the past.  Glycemic data:  Accu-Chek guide me glucose meter download from October 22 to November 5 , 2025 average  blood sugar 119.  Lowest blood sugar 95, highest blood sugar 141.    Hypoglycemia: Patient has no hypoglycemic episodes. Patient has hypoglycemia awareness.  Factors modifying glucose control: 1.  Diabetic diet assessment: 3 meals a day.  She is trying to limit portion.  2.  Staying active or exercising:   3.  Medication compliance: compliant all of the time.  Interval history  Hemoglobin A1c 6.5% today.  Mostly acceptable blood sugar on glucometer data.  Diabetes has been as reviewed and noted above.  She gained about 6 pounds of weight since last visit.  Denies any GI issues related to taking Ozempic .  No numbness and tingling of the feet.  She complains of back pain and has a plan to follow-up with primary care provider.  No other complaints today.  REVIEW OF SYSTEMS As per history of present illness.   PAST MEDICAL HISTORY: Past Medical History:  Diagnosis Date   Anemia    Asthma    Diabetes mellitus    Family history of adverse reaction to anesthesia    Brother   Heart murmur    Hyperlipidemia    Hypertension    Pancreatitis    Pneumonia     PAST SURGICAL HISTORY: Past Surgical History:  Procedure Laterality Date   ABDOMINAL HYSTERECTOMY     ANTERIOR  LAT LUMBAR FUSION N/A 07/07/2019   Procedure: ANTERIOR LATERAL LUMBAR FUSION (XLIF) LUMBAR TWO THROUGH LUMBAR THREE, POSTERIOR SPINAL FUSION INTERBODY LUMBAR TWO THROUGH LUMBAR THREE;  Surgeon: Burnetta Aures, MD;  Location: [facility] OR;  Service: Orthopedics;  Laterality: N/A;  4 HRS   CESAREAN SECTION     CHOLECYSTECTOMY     GANGLION CYST EXCISION     HIP CAPSULECTOMY Right 10/14/2018   Procedure: RIGHT HIP HETEROTOPIC OSSIFICATION RESECTION;  Surgeon: Kendal Franky SQUIBB, MD;  Location: [facility] OR;  Service: Orthopedics;  Laterality: Right;   LAPAROSCOPY     LUMBAR FUSION      ALLERGIES: Allergies  Allergen Reactions   Liraglutide Other (See Comments)    Other reaction(s): Pancreatitis  *Victoza     FAMILY HISTORY:   Family History  Problem Relation Age of Onset   Glaucoma Mother    Diabetes Mother    Hypertension Mother    Stroke Mother    Glaucoma Brother    Melanoma Brother    Hypertension Brother    Diabetes Maternal Aunt    Diabetes Paternal Aunt    Diabetes Maternal Grandmother    Heart disease Neg Hx     SOCIAL HISTORY: Social History   Socioeconomic History   Marital status: Divorced    Spouse name: Not on file   Number of children: 1   Years of education: Not on file   Highest education level: Not on file  Occupational History   Not on file  Tobacco Use   Smoking status: Former    Types: Cigars   Smokeless tobacco: Never  Vaping Use   Vaping status: Never Used  Substance and Sexual Activity   Alcohol use: Yes    Comment: occasionally   Drug use: No   Sexual activity: Not Currently  Other Topics Concern   Not on file  Social History Narrative   Not on file   Social Drivers of Health   Financial Resource Strain: Not on file  Food Insecurity: Not on file  Transportation Needs: Not on file  Physical Activity: Not on file  Stress: Not on file  Social Connections: Not on file    MEDICATIONS:  Current Outpatient Medications  Medication Sig Dispense Refill   Accu-Chek FastClix Lancets MISC Use Accu Chek Fastclix lancets to check blood sugar twice daily. 204 each 3   albuterol  (VENTOLIN  HFA) 108 (90 Base) MCG/ACT inhaler Inhale 1-2 puffs into the lungs every 4 (four) hours as needed for wheezing or shortness of breath.     aspirin  EC 81 MG tablet Take 81 mg by mouth daily. Swallow whole.     Blood Glucose Monitoring Suppl (ACCU-CHEK GUIDE) w/Device KIT 1 each by Does not apply route 2 (two) times a day. Use as instructed to check blood sugar twice daily. 1 kit 0   dapagliflozin  propanediol (FARXIGA ) 10 MG TABS tablet Take 1 tablet (10 mg total) by mouth daily. 90 tablet 3   fluticasone (FLONASE) 50 MCG/ACT nasal spray Place 1 spray into both nostrils daily as needed  for allergies or rhinitis.     fluticasone (FLOVENT HFA) 110 MCG/ACT inhaler Flovent HFA 110 mcg/actuation aerosol inhaler     glucose blood test strip 2 times a day and use as instructed 100 each 12   ibuprofen  (ADVIL ) 600 MG tablet Take 600 mg by mouth every 6 (six) hours.     insulin  aspart protamine - aspart (NOVOLOG  MIX 70/30 FLEXPEN) (70-30) 100 UNIT/ML FlexPen Inject 14 Units into the  skin at bedtime and 18 units into the skin in the morning daily with a meal. (Patient taking differently: Inject 18 Units into the skin at bedtime and 14 units into the skin in the morning daily with a meal.) 30 mL 2   Insulin  Pen Needle (B-D UF III MINI PEN NEEDLES) 31G X 5 MM MISC 1 EACH BY DOES NOT APPLY ROUTE 2 (TWO) TIMES DAILY. USE TO INJECT INSULIN  TWICE DAILY. 100 each 2   metFORMIN  (GLUCOPHAGE -XR) 500 MG 24 hr tablet Take 2 tablets (1,000 mg total) by mouth daily. 180 tablet 3   Naproxen Sod-diphenhydrAMINE  (ALEVE PM) 220-25 MG TABS 2 tablets Orally at bedtime     ramipril  (ALTACE ) 10 MG capsule Take 10 mg by mouth daily.     rosuvastatin  (CRESTOR ) 20 MG tablet TAKE 1 TABLET BY MOUTH EVERYDAY AT BEDTIME 90 tablet 3   Semaglutide , 1 MG/DOSE, 4 MG/3ML SOPN Inject 1 mg as directed once a week. 9 mL 3   topiramate  (TOPAMAX ) 25 MG tablet      Continuous Glucose Sensor (FREESTYLE LIBRE 3 PLUS SENSOR) MISC 1 each by Does not apply route continuous. Change every 15 days. (Patient not taking: Reported on 11/18/2023) 6 each 3   No current facility-administered medications for this visit.    PHYSICAL EXAM: Vitals:   11/18/23 0911  BP: 132/70  Pulse: 88  Resp: 16  SpO2: 99%  Weight: 164 lb 9.6 oz (74.7 kg)  Height: 5' 2 (1.575 m)     Body mass index is 30.11 kg/m.  Wt Readings from Last 3 Encounters:  11/18/23 164 lb 9.6 oz (74.7 kg)  07/28/23 158 lb 6.4 oz (71.8 kg)  04/08/23 148 lb (67.1 kg)    General: Well developed, well nourished female in no apparent distress.  HEENT: AT/[place], no external  lesions.  Eyes: Conjunctiva clear and no icterus. Neck: Neck supple  Lungs: Respirations not labored Neurologic: Alert, oriented, normal speech Extremities / Skin: Dry.   Psychiatric: Does not appear depressed or anxious  Diabetic Foot Exam - Simple   No data filed    LABS Reviewed Lab Results  Component Value Date   HGBA1C 6.5 11/18/2023   HGBA1C 6.6 (A) 08/18/2023   HGBA1C 6.4 (H) 04/02/2023   Lab Results  Component Value Date   FRUCTOSAMINE 293 (H) 03/22/2018   FRUCTOSAMINE 250 08/12/2017   FRUCTOSAMINE 290 (H) 06/16/2017   Lab Results  Component Value Date   CHOL 141 07/22/2022   HDL 53.10 07/22/2022   LDLCALC 75 07/22/2022   LDLDIRECT 75.0 03/22/2018   TRIG 64.0 07/22/2022   CHOLHDL 3 07/22/2022   Lab Results  Component Value Date   MICRALBCREAT 2 04/02/2023   Lab Results  Component Value Date   CREATININE 0.77 04/02/2023   Lab Results  Component Value Date   GFR 96.79 12/02/2022    ASSESSMENT / PLAN  1. Controlled diabetes mellitus type 2 with complications (HCC)     Diabetes Mellitus type 2, complicated by diabetic retinopathy. - Diabetic status / severity: Controlled.  Lab Results  Component Value Date   HGBA1C 6.5 11/18/2023    - Hemoglobin A1c goal : <6.5%  - Medications: Adjusted and discussed as follows.     INSULIN  regimen is: NovoLog  mix at meals: 10-18 units a.m.-18 before dinner.  Okay to adjust couple of units based on meal size and blood sugar.   Non-insulin  hypoglycemic drugs: Farxiga  10 mg daily, metformin  ER 1000 mg daily.   Increase Ozempic  from  0.5 to 1 mg weekly.    Ophthalmology okay with resuming Ozempic .  - Home glucose testing: Use CGM as able, check blood sugar at least 2 times a day before meals and at bedtime.  Sent prescription for freestyle libre 3+ in the last visit. - Discussed/ Gave Hypoglycemia treatment plan.  # Consult : not required at this time.   # Annual urine for microalbuminuria/ creatinine  ratio, no microalbuminuria currently, continue ACE/ARB /ramipril . Last  Lab Results  Component Value Date   MICRALBCREAT 2 04/02/2023    # Foot check nightly.  # Diabetic retinopathy, regular follow-up with ophthalmology and retina specialist.  - Diet: Make healthy diabetic food choices - Life style / activity / exercise: Discussed.  2. Blood pressure  -  BP Readings from Last 1 Encounters:  11/18/23 132/70    - Control is in target.  - No change in current plans.  3. Lipid status / Hyperlipidemia - Last  Lab Results  Component Value Date   LDLCALC 75 07/22/2022   - Continue rosuvastatin  20 mg daily.  Managed by cardiology.  Diagnoses and all orders for this visit:  Controlled diabetes mellitus type 2 with complications (HCC) -     POCT glycosylated hemoglobin (Hb A1C) -     Semaglutide , 1 MG/DOSE, 4 MG/3ML SOPN; Inject 1 mg as directed once a week. -     Basic metabolic panel with GFR -     Microalbumin / creatinine urine ratio -     Hemoglobin A1c     DISPOSITION Follow up in clinic in 4 months suggested.  Labs prior to follow-up visit as ordered.   All questions answered and patient verbalized understanding of the plan.  Joleigh Mineau, MD [facility] Endocrinology [facility] [address] [place], [address] [place], [place] [address] Phone # [phone]  At least part of this note was generated using voice recognition software. Inadvertent word errors may have occurred, which were not recognized during the proofreading process.

## 2023-12-24 ENCOUNTER — Other Ambulatory Visit: Payer: Self-pay

## 2023-12-24 DIAGNOSIS — E782 Mixed hyperlipidemia: Secondary | ICD-10-CM

## 2023-12-24 DIAGNOSIS — I700 Atherosclerosis of aorta: Secondary | ICD-10-CM

## 2023-12-24 DIAGNOSIS — R931 Abnormal findings on diagnostic imaging of heart and coronary circulation: Secondary | ICD-10-CM

## 2023-12-24 DIAGNOSIS — I2510 Atherosclerotic heart disease of native coronary artery without angina pectoris: Secondary | ICD-10-CM

## 2023-12-25 MED ORDER — ROSUVASTATIN CALCIUM 20 MG PO TABS: 20.0000 mg | ORAL_TABLET | Freq: Every day | ORAL | 2 refills | Status: AC

## 2023-12-28 DIAGNOSIS — M25511 Pain in right shoulder: Secondary | ICD-10-CM | POA: Diagnosis not present

## 2023-12-28 DIAGNOSIS — N390 Urinary tract infection, site not specified: Secondary | ICD-10-CM | POA: Diagnosis not present

## 2023-12-29 DIAGNOSIS — M25511 Pain in right shoulder: Secondary | ICD-10-CM | POA: Diagnosis not present

## 2024-01-20 ENCOUNTER — Other Ambulatory Visit: Payer: Self-pay | Admitting: Endocrinology

## 2024-01-20 NOTE — Telephone Encounter (Signed)
 RX addended to reflect most recent notes

## 2024-02-03 ENCOUNTER — Encounter: Payer: Self-pay | Admitting: Endocrinology

## 2024-02-03 DIAGNOSIS — E118 Type 2 diabetes mellitus with unspecified complications: Secondary | ICD-10-CM

## 2024-02-04 MED ORDER — INSULIN LISPRO PROT & LISPRO (75-25 MIX) 100 UNIT/ML KWIKPEN: PEN_INJECTOR | SUBCUTANEOUS | 4 refills | Status: AC

## 2024-02-04 NOTE — Telephone Encounter (Signed)
 Sent prescription for Humalog  mix 75/25 to take same dose as NovoLog  mix.

## 2024-03-10 ENCOUNTER — Other Ambulatory Visit

## 2024-03-16 ENCOUNTER — Ambulatory Visit: Admitting: Endocrinology

## 2024-07-27 ENCOUNTER — Other Ambulatory Visit (HOSPITAL_COMMUNITY)
